# Patient Record
Sex: Male | Born: 1955 | Race: White | Hispanic: No | Marital: Single | Smoking: Former smoker
Health system: Southern US, Community
[De-identification: ages and names within clinical notes are randomized; demographics above are authoritative.]

## PROBLEM LIST (undated history)

## (undated) DIAGNOSIS — F419 Anxiety disorder, unspecified: Secondary | ICD-10-CM

## (undated) DIAGNOSIS — K222 Esophageal obstruction: Secondary | ICD-10-CM

## (undated) DIAGNOSIS — K269 Duodenal ulcer, unspecified as acute or chronic, without hemorrhage or perforation: Secondary | ICD-10-CM

## (undated) DIAGNOSIS — R600 Localized edema: Secondary | ICD-10-CM

## (undated) DIAGNOSIS — K209 Esophagitis, unspecified without bleeding: Secondary | ICD-10-CM

## (undated) DIAGNOSIS — K259 Gastric ulcer, unspecified as acute or chronic, without hemorrhage or perforation: Secondary | ICD-10-CM

## (undated) DIAGNOSIS — F79 Unspecified intellectual disabilities: Secondary | ICD-10-CM

## (undated) DIAGNOSIS — Z872 Personal history of diseases of the skin and subcutaneous tissue: Secondary | ICD-10-CM

## (undated) DIAGNOSIS — E785 Hyperlipidemia, unspecified: Secondary | ICD-10-CM

## (undated) HISTORY — DX: Gastric ulcer, unspecified as acute or chronic, without hemorrhage or perforation: K25.9

## (undated) HISTORY — DX: Esophagitis, unspecified: K20.9

## (undated) HISTORY — DX: Duodenal ulcer, unspecified as acute or chronic, without hemorrhage or perforation: K26.9

## (undated) HISTORY — DX: Esophagitis, unspecified without bleeding: K20.90

## (undated) HISTORY — DX: Esophageal obstruction: K22.2

---

## 1998-04-24 ENCOUNTER — Ambulatory Visit (HOSPITAL_BASED_OUTPATIENT_CLINIC_OR_DEPARTMENT_OTHER): Admission: RE | Admit: 1998-04-24 | Discharge: 1998-04-24 | Payer: Self-pay | Admitting: Dentistry

## 2002-04-08 ENCOUNTER — Emergency Department (HOSPITAL_COMMUNITY): Admission: AD | Admit: 2002-04-08 | Discharge: 2002-04-08 | Payer: Self-pay | Admitting: Emergency Medicine

## 2002-04-08 ENCOUNTER — Encounter: Payer: Self-pay | Admitting: Emergency Medicine

## 2005-07-15 ENCOUNTER — Emergency Department (HOSPITAL_COMMUNITY): Admission: EM | Admit: 2005-07-15 | Discharge: 2005-07-15 | Payer: Self-pay | Admitting: Emergency Medicine

## 2006-06-15 ENCOUNTER — Encounter (INDEPENDENT_AMBULATORY_CARE_PROVIDER_SITE_OTHER): Payer: Self-pay | Admitting: Emergency Medicine

## 2006-06-15 ENCOUNTER — Ambulatory Visit: Payer: Self-pay | Admitting: Vascular Surgery

## 2006-06-15 ENCOUNTER — Emergency Department (HOSPITAL_COMMUNITY): Admission: EM | Admit: 2006-06-15 | Discharge: 2006-06-15 | Payer: Self-pay | Admitting: Emergency Medicine

## 2009-05-02 ENCOUNTER — Ambulatory Visit: Payer: Self-pay | Admitting: Vascular Surgery

## 2010-05-29 NOTE — Procedures (Signed)
DUPLEX DEEP VENOUS EXAM - LOWER EXTREMITY   INDICATION:  Right lower extremity swelling/redness, cellulitis.   HISTORY:  Edema:  Right lower extremity edema since 04/26/2009.  Trauma/Surgery:  No.  Pain:  No.  PE:  No.  Previous DVT:  Unknown.  Anticoagulants:  Other:   DUPLEX EXAM:                CFV   SFV   PopV  PTV    GSV                R  L  R  L  R  L  R   L  R  L  Thrombosis    o  o  o     o     o      o  Spontaneous   +  +  +     +     +      +  Phasic        d  d  d     d     d      d  Augmentation  +  +  +     +     +      +  Compressible  +  +  +     +     +      +  Competent   Legend:  + - yes  o - no  p - partial  D - decreased   IMPRESSION:  No evidence of deep venous thrombosis noted in the right  lower extremity; however, there was limited visualization of the right  calf veins due to extensive lower extremity edema.   A preliminary report was called to Dr. Hayden Rasmussen office on 05/02/2009 and  given to [facility].    _____________________________  Quita Skye. Hart Rochester, M.D.   CH/MEDQ  D:  05/02/2009  T:  05/02/2009  Job:  [id number]

## 2010-11-01 LAB — I-STAT 8, (EC8 V) (CONVERTED LAB)
BUN: 8
Bicarbonate: 28.5 — ABNORMAL HIGH
Chloride: 101
Glucose, Bld: 105 — ABNORMAL HIGH
HCT: 44
Hemoglobin: 15
Operator id: 270111
Potassium: 3.4 — ABNORMAL LOW
Sodium: 136

## 2010-11-01 LAB — CBC
HCT: 42.5
Hemoglobin: 14.5
MCHC: 34.2
MCV: 90.2
Platelets: 286
RBC: 4.72
RDW: 13.6
WBC: 12.5 — ABNORMAL HIGH

## 2010-11-01 LAB — DIFFERENTIAL
Basophils Absolute: 0
Basophils Relative: 0
Eosinophils Absolute: 0
Eosinophils Relative: 0
Lymphocytes Relative: 9 — ABNORMAL LOW
Lymphs Abs: 1.2
Monocytes Absolute: 0.5
Monocytes Relative: 4
Neutro Abs: 10.8 — ABNORMAL HIGH
Neutrophils Relative %: 86 — ABNORMAL HIGH

## 2010-11-01 LAB — POCT I-STAT CREATININE
Creatinine, Ser: 0.8
Operator id: 270111

## 2010-11-01 LAB — D-DIMER, QUANTITATIVE: D-Dimer, Quant: 0.32

## 2012-01-27 ENCOUNTER — Other Ambulatory Visit: Payer: Self-pay | Admitting: Family Medicine

## 2012-01-27 ENCOUNTER — Observation Stay (HOSPITAL_COMMUNITY)
Admission: EM | Admit: 2012-01-27 | Discharge: 2012-01-28 | Payer: Medicare Other | Attending: Orthopedic Surgery | Admitting: Orthopedic Surgery

## 2012-01-27 ENCOUNTER — Encounter (HOSPITAL_COMMUNITY): Payer: Self-pay | Admitting: *Deleted

## 2012-01-27 ENCOUNTER — Observation Stay (HOSPITAL_COMMUNITY): Payer: Medicare Other

## 2012-01-27 ENCOUNTER — Ambulatory Visit
Admission: RE | Admit: 2012-01-27 | Discharge: 2012-01-27 | Disposition: A | Discharge status: Home / Self Care | Payer: Medicaid Other | Source: Ambulatory Visit | Attending: Family Medicine | Admitting: Family Medicine

## 2012-01-27 DIAGNOSIS — S82853A Displaced trimalleolar fracture of unspecified lower leg, initial encounter for closed fracture: Principal | ICD-10-CM | POA: Insufficient documentation

## 2012-01-27 DIAGNOSIS — M25571 Pain in right ankle and joints of right foot: Secondary | ICD-10-CM

## 2012-01-27 DIAGNOSIS — F73 Profound intellectual disabilities: Secondary | ICD-10-CM | POA: Insufficient documentation

## 2012-01-27 DIAGNOSIS — R911 Solitary pulmonary nodule: Secondary | ICD-10-CM | POA: Diagnosis present

## 2012-01-27 DIAGNOSIS — D72829 Elevated white blood cell count, unspecified: Secondary | ICD-10-CM | POA: Insufficient documentation

## 2012-01-27 DIAGNOSIS — R21 Rash and other nonspecific skin eruption: Secondary | ICD-10-CM | POA: Insufficient documentation

## 2012-01-27 DIAGNOSIS — E876 Hypokalemia: Secondary | ICD-10-CM | POA: Insufficient documentation

## 2012-01-27 DIAGNOSIS — M25579 Pain in unspecified ankle and joints of unspecified foot: Secondary | ICD-10-CM | POA: Insufficient documentation

## 2012-01-27 DIAGNOSIS — E785 Hyperlipidemia, unspecified: Secondary | ICD-10-CM | POA: Diagnosis present

## 2012-01-27 HISTORY — DX: Hyperlipidemia, unspecified: E78.5

## 2012-01-27 HISTORY — DX: Unspecified intellectual disabilities: F79

## 2012-01-27 LAB — PROTIME-INR
INR: 1.09 (ref 0.00–1.49)
Prothrombin Time: 14 seconds (ref 11.6–15.2)

## 2012-01-27 LAB — BASIC METABOLIC PANEL
CO2: 24 mEq/L (ref 19–32)
Chloride: 100 mEq/L (ref 96–112)
Glucose, Bld: 125 mg/dL — ABNORMAL HIGH (ref 70–99)
Potassium: 3.4 mEq/L — ABNORMAL LOW (ref 3.5–5.1)
Sodium: 138 mEq/L (ref 135–145)

## 2012-01-27 LAB — CBC WITH DIFFERENTIAL/PLATELET
Eosinophils Absolute: 0 10*3/uL (ref 0.0–0.7)
Hemoglobin: 13.7 g/dL (ref 13.0–17.0)
Lymphs Abs: 0.8 10*3/uL (ref 0.7–4.0)
MCH: 29.7 pg (ref 26.0–34.0)
Monocytes Relative: 6 % (ref 3–12)
Neutro Abs: 9.9 10*3/uL — ABNORMAL HIGH (ref 1.7–7.7)
Neutrophils Relative %: 87 % — ABNORMAL HIGH (ref 43–77)
Platelets: 307 10*3/uL (ref 150–400)
RBC: 4.62 MIL/uL (ref 4.22–5.81)
WBC: 11.3 10*3/uL — ABNORMAL HIGH (ref 4.0–10.5)

## 2012-01-27 LAB — APTT: aPTT: 29 seconds (ref 24–37)

## 2012-01-27 MED ORDER — SODIUM CHLORIDE 0.9 % IV SOLN: 250.0000 mL | INTRAVENOUS | Status: DC | PRN

## 2012-01-27 MED ORDER — SODIUM CHLORIDE 0.9 % IV SOLN
INTRAVENOUS | Status: AC | PRN
  Administered 2012-01-27: 500 mL via INTRAVENOUS

## 2012-01-27 MED ORDER — ASPIRIN EC 81 MG PO TBEC
81.0000 mg | DELAYED_RELEASE_TABLET | Freq: Every day | ORAL | Status: DC
  Administered 2012-01-27 – 2012-01-28 (×2): 81 mg via ORAL
  Filled 2012-01-27 (×2): qty 1

## 2012-01-27 MED ORDER — SODIUM CHLORIDE 0.9 % IJ SOLN: 3.0000 mL | INTRAMUSCULAR | Status: DC | PRN

## 2012-01-27 MED ORDER — SODIUM CHLORIDE 0.9 % IJ SOLN: 3.0000 mL | Freq: Two times a day (BID) | INTRAMUSCULAR | Status: DC

## 2012-01-27 MED ORDER — PROPOFOL 10 MG/ML IV BOLUS
INTRAVENOUS | Status: AC | PRN
  Administered 2012-01-27: 40 mg via INTRAVENOUS

## 2012-01-27 MED ORDER — SODIUM CHLORIDE 0.9 % IV BOLUS (SEPSIS)
500.0000 mL | Freq: Once | INTRAVENOUS | Status: AC
  Administered 2012-01-27: 500 mL via INTRAVENOUS

## 2012-01-27 MED ORDER — HYDROCODONE-ACETAMINOPHEN 5-325 MG PO TABS: 1.0000 | ORAL_TABLET | ORAL | Status: DC | PRN

## 2012-01-27 MED ORDER — PROPOFOL 10 MG/ML IV BOLUS: 0.5000 mg/kg | Freq: Once | INTRAVENOUS | Status: DC

## 2012-01-27 MED ORDER — NYSTATIN 100000 UNIT/GM EX POWD
Freq: Three times a day (TID) | CUTANEOUS | Status: DC
  Administered 2012-01-27 – 2012-01-28 (×2): via TOPICAL
  Filled 2012-01-27 (×3): qty 15

## 2012-01-27 MED ORDER — PROPOFOL 10 MG/ML IV BOLUS
INTRAVENOUS | Status: AC
  Filled 2012-01-27: qty 20

## 2012-01-27 MED ORDER — POTASSIUM CHLORIDE 10 MEQ/100ML IV SOLN
10.0000 meq | INTRAVENOUS | Status: AC
Start: 2012-01-27 — End: 2012-01-28
  Administered 2012-01-27 (×4): 10 meq via INTRAVENOUS
  Filled 2012-01-27 (×4): qty 100

## 2012-01-27 MED ORDER — LIDOCAINE HCL (PF) 1 % IJ SOLN
INTRAMUSCULAR | Status: AC
  Filled 2012-01-27: qty 5

## 2012-01-27 MED ORDER — LIDOCAINE HCL (CARDIAC) 20 MG/ML IV SOLN
INTRAVENOUS | Status: AC
  Filled 2012-01-27: qty 5

## 2012-01-27 MED ORDER — LORAZEPAM 2 MG/ML IJ SOLN: 0.5000 mg | Freq: Three times a day (TID) | INTRAMUSCULAR | Status: DC | PRN

## 2012-01-27 MED ORDER — LACTATED RINGERS IV SOLN: INTRAVENOUS | Status: DC

## 2012-01-27 NOTE — ED Notes (Addendum)
Pt in from [facility] to have x ray to R ankle, per EMS pt is at baseline neuro status, pt accompanied by caregiver, per report the pts ankle is fractured x 3, swelling present to R ankle, ankle is not immobilized at time of arrival to ED, pt resides at [facility], per caregiver the pt slipped when getting out of the facility's van yesterday, pt unable to tolerate wt bearing & cannot ambulate which is a change from baseline

## 2012-01-27 NOTE — ED Provider Notes (Addendum)
History     CSN: [id number]  Arrival date & time 01/27/12  1257   First MD Initiated Contact with Patient 01/27/12 1300      No chief complaint on file.   (Consider location/radiation/quality/duration/timing/severity/associated sxs/prior treatment) Patient is a 57 y.o. male presenting with ankle pain. The history is provided by a caregiver.  Ankle Pain  The incident occurred yesterday. Incident location: Was getting out of the group Van and slipped and fell. The injury mechanism was a fall. The pain is present in the right ankle. The quality of the pain is described as sharp. The pain is at a severity of 10/10. The pain is severe. The pain has been constant since onset. Associated symptoms include inability to bear weight. The symptoms are aggravated by activity, bearing weight and palpation. He has tried nothing for the symptoms. The treatment provided no relief.    No past medical history on file.  No past surgical history on file.  No family history on file.  History  Substance Use Topics  . Smoking status: Not on file  . Smokeless tobacco: Not on file  . Alcohol Use: Not on file      Review of Systems  All other systems reviewed and are negative.    Allergies  Review of patient's allergies indicates not on file.  Home Medications   Current Outpatient Rx  Name  Route  Sig  Dispense  Refill  . ASPIRIN EC 81 MG PO TBEC   Oral   Take 81 mg by mouth daily.         . ATORVASTATIN CALCIUM 10 MG PO TABS   Oral   Take 10 mg by mouth daily.         . CHLORHEXIDINE GLUCONATE 0.12 % MT SOLN   Mouth/Throat   Use as directed 10 mLs in the mouth or throat 3 (three) times daily. Apply to teeth and gums with toothbrush (staff hand over hand)         . VITAMIN D 1000 UNITS PO TABS   Oral   Take 2,000 Units by mouth daily.         . FUROSEMIDE 40 MG PO TABS   Oral   Take 40 mg by mouth daily.         . PSYLLIUM 58.6 % PO POWD   Oral   Take 1 packet by  mouth daily.           SpO2 98%  Physical Exam  Nursing note and vitals reviewed. Constitutional: He is oriented to person, place, and time. He appears well-developed and well-nourished. He appears distressed.  HENT:  Head: Normocephalic and atraumatic.  Mouth/Throat: Oropharynx is clear and moist.  Eyes: Conjunctivae normal and EOM are normal. Pupils are equal, round, and reactive to light.  Neck: Normal range of motion. Neck supple.  Cardiovascular: Normal rate, regular rhythm and intact distal pulses.   No murmur heard. Pulmonary/Chest: Effort normal and breath sounds normal. No respiratory distress. He has no wheezes. He has no rales.  Abdominal: Soft. He exhibits no distension. There is no tenderness. There is no rebound and no guarding.    Musculoskeletal: He exhibits tenderness. He exhibits no edema.       Right ankle: He exhibits decreased range of motion, swelling, ecchymosis and deformity. He exhibits normal pulse. tenderness. Lateral malleolus and medial malleolus tenderness found. No proximal fibula tenderness found.       Arms:      2+ DP  and PT pulses in the right foot  Neurological: He is alert and oriented to person, place, and time.  Skin: Skin is warm and dry. No rash noted. No erythema.  Psychiatric: He has a normal mood and affect. His behavior is normal.    ED Course  Procedures (including critical care time)  Labs Reviewed - No data to display Dg Ankle Complete Right  01/27/2012  *RADIOLOGY REPORT*  Clinical Data: .  Pain and swelling of the ankle.  RIGHT ANKLE - COMPLETE 3+ VIEW  Comparison: None.  Findings: Trimalleolar fracture dislocation of the ankle noted, with the distal tibial articular surface displaced anteriorly by 2 cm with respect to the talus, and with a vertical fracture of the posterior malleolus, a shallow oblique fracture of the lateral malleolus, and a suspected transverse fracture of the medial malleolus. No calcaneal fracture observed.   IMPRESSION:  1.  Trimalleolar fracture dislocation of the ankle.   Original Report Authenticated By: Gaylyn Rong, M.D.     Date: 01/27/2012  Rate: 96  Rhythm: normal sinus rhythm  QRS Axis: normal  Intervals: normal  ST/T Wave abnormalities: normal  Conduction Disutrbances: none  Narrative Interpretation: unremarkable      1. Trimalleolar fracture of ankle, closed       MDM    Patient with a history of a fall yesterday while getting out of the croup a.m. He x-ray today shows a trimalleolar fracture or dislocation of the ankle. He is neurovascularly intact however it however patient refuses to use his leg. Patient is mentally handicapped and is nonverbal however he is awake and alert and does signify that there is pain in his right ankle. Did not attempt to relocate due to the significant swelling and pain. Will discuss with orthopedics for further recommendations  2:28 PM Discussed pt with Dr. [name] who wants to sedate pt in the ED for reduction.  Will have propofol ready.   Procedural sedation Performed by: Gwyneth Sprout Consent: Verbal consent obtained. Risks and benefits: risks, benefits and alternatives were discussed Required items: required blood products, implants, devices, and special equipment available Patient identity confirmed: arm band and provided demographic data Time out: Immediately prior to procedure a "time out" was called to verify the correct patient, procedure, equipment, support staff and site/side marked as required.  Sedation type: moderate (conscious) sedation NPO time confirmed and considedered  Sedatives: PROPOFOL  Physician Time at Bedside: 20 min  Vitals: Vital signs were monitored during sedation. Cardiac Monitor, pulse oximeter Patient tolerance: Patient tolerated the procedure well with no immediate complications. Comments: Pt with uneventful recovered. Returned to pre-procedural sedation baseline     Gwyneth Sprout,  MD 01/27/12 1559  Gwyneth Sprout, MD 01/27/12 1559  Gwyneth Sprout, MD 01/27/12 1610  Gwyneth Sprout, MD 01/27/12 [id number]

## 2012-01-27 NOTE — Progress Notes (Signed)
Orthopedic Tech Progress Note Patient Details:  Steven [name] [birth year] [id number]  Ortho Devices Type of Ortho Device: Post (short leg) splint;Stirrup splint Ortho Device/Splint Location: right leg Ortho Device/Splint Interventions: Application   [name] 01/27/2012, 3:56 PM

## 2012-01-27 NOTE — ED Notes (Signed)
Pt caregiver unable to provide medical history

## 2012-01-27 NOTE — H&P (Addendum)
No primary provider on file. Chief Complaint: Ankle injury   History: Pt in from [facility] to have x ray to R ankle, per EMS pt is at baseline neuro status, pt accompanied by caregiver, per report the pts ankle is fractured x 3, swelling present to R ankle, ankle is not immobilized at time of arrival to ED, pt resides at [facility], per caregiver the pt slipped when getting out of the facility's van yesterday, pt unable to tolerate wt bearing & cannot ambulate which is a change from baseline.  Patient was been unable to ambulate or bear wt since the time of the fall yesterday.    No past medical history on file.  No Known Allergies  No current facility-administered medications on file prior to encounter.   Current Outpatient Prescriptions on File Prior to Encounter  Medication Sig Dispense Refill  . atorvastatin (LIPITOR) 10 MG tablet Take 10 mg by mouth daily.      . furosemide (LASIX) 40 MG tablet Take 40 mg by mouth daily.        Physical Exam: Filed Vitals:   01/27/12 1314  BP: 141/85  Pulse: 96  Temp: 99 F (37.2 C)  Resp: 18   compartments soft/NT 2+ DP/PT pulses Skin intact Swollen ankle No proximal knee pain No sob/cp No pain with hip/knee ROM Image: Dg Ankle Complete Right  01/27/2012  *RADIOLOGY REPORT*  Clinical Data: .  Pain and swelling of the ankle.  RIGHT ANKLE - COMPLETE 3+ VIEW  Comparison: None.  Findings: Trimalleolar fracture dislocation of the ankle noted, with the distal tibial articular surface displaced anteriorly by 2 cm with respect to the talus, and with a vertical fracture of the posterior malleolus, a shallow oblique fracture of the lateral malleolus, and a suspected transverse fracture of the medial malleolus. No calcaneal fracture observed.  IMPRESSION:  1.  Trimalleolar fracture dislocation of the ankle.   Original Report Authenticated By: Gaylyn Rong, M.D.     A/P:  Patient with profound mental retardation who fell  yesterday.  Patient unable to bear weight and had gross deformity. According to aid that is with him he has had worse pain since the fall.  Spoke with his sister and she informed me that he was supposed to be brought to ER yesterday according to what she was told.   Plan:   CR in the ER with sedation  Admit for observation and elevation  Will discuss definitive fracture management  With Dr Victorino Dike  Also given complex situation will consult medicine for non-orthopedic issues   Post-reduction xrays satisfactory Remained intact

## 2012-01-27 NOTE — ED Notes (Signed)
Spoke with Harlow Mares, pts sister re: informed consent for procedure, [place], MD verified consent as well over the phone

## 2012-01-27 NOTE — Consult Note (Signed)
Patient's PCP: [name], MD Consulting physician: Dr. [name]  Reason for the consult: Help manage patient's medical conditions  History of Present Illness: Steven [name] is a 57 y.o. 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  Patient is not able to provide any history most of the history was obtained from talking to the ER physician and patient's sister Steven [name], telephone [phone], who is patient's guardian.  Yesterday patient was getting at the group home [place].  He slipped and fell and sustained injury to the right ankle.  Given persistent pain x-rays were obtained today which showed right common malleolus ankle fracture.  The hospitalist service was consulted by orthopedic service for medical management.  Review of Systems: Not obtainable from the patient due to patient's mentation.  Past Medical History  Diagnosis Date  . Hyperlipidemia   . Mental retardation    History reviewed. No pertinent past surgical history. History reviewed. No pertinent family history. History   Social History  . Marital Status: Single    Spouse Name: N/A    Number of Children: N/A  . Years of Education: N/A   Occupational History  . Not on file.   Social History Main Topics  . Smoking status: Current Every Day Smoker -- 0.5 packs/day    Types: Cigarettes  . Smokeless tobacco: Not on file  . Alcohol Use: No  . Drug Use: No  . Sexually Active:    Other Topics Concern  . Not on file   Social History Narrative  . No narrative on file   Allergies: Allergy to Haldol  Home Meds: Prior to Admission medications   Medication Sig Start Date End Date Taking? Authorizing Provider  aspirin EC 81 MG tablet Take 81 mg by mouth daily.   Yes Historical Provider, MD    atorvastatin (LIPITOR) 10 MG tablet Take 10 mg by mouth daily.   Yes Historical Provider, MD  chlorhexidine (PERIDEX) 0.12 % solution Use as directed 10 mLs in the mouth or throat 3 (three) times daily. Apply to teeth and gums with toothbrush (staff hand over hand)   Yes Historical Provider, MD  cholecalciferol (VITAMIN D) 1000 UNITS tablet Take 2,000 Units by mouth daily.   Yes Historical Provider, MD  furosemide (LASIX) 40 MG tablet Take 40 mg by mouth daily.   Yes Historical Provider, MD  psyllium (METAMUCIL) 58.6 % powder Take 1 packet by mouth daily.   Yes Historical Provider, MD    Physical Exam: Blood pressure 135/73, pulse 94, temperature 99 F (37.2 C), temperature source Oral, resp. rate 24, weight 83.915 kg (185 lb), SpO2 100.00%. General: Awake,  not oriented x3, nonverbal, No acute distress. HEENT: EOMI, Moist mucous membranes Neck: Supple CV: S1 and S2 Lungs: Clear to ascultation bilaterally Abdomen: Soft, Nontender, Nondistended, +bowel sounds. Ext: Good pulses. Trace edema. No clubbing or cyanosis noted.  Right leg in brace. Neuro: Cranial Nerves II-XII grossly intact. Has 5/5 motor strength in upper and lower extremities. Skin: Erythematous Rash on the groin.  Lab results:  [facility] 01/27/12 1634  NA 138  K 3.4*  CL 100  CO2 24  GLUCOSE 125*  BUN 10  CREATININE 0.57  CALCIUM 9.2  MG --  PHOS --   No results found for this basename: AST:2,ALT:2,ALKPHOS:2,BILITOT:2,PROT:2,ALBUMIN:2 in  the last 72 hours No results found for this basename: LIPASE:2,AMYLASE:2 in the last 72 hours  Basename 01/27/12 1417  WBC 11.3*  NEUTROABS 9.9*  HGB 13.7  HCT 41.4  MCV 89.6  PLT 307   No results found for this basename: CKTOTAL:3,CKMB:3,CKMBINDEX:3,TROPONINI:3 in the last 72 hours No components found with this basename: POCBNP:3 No results found for this basename: DDIMER in the last 72 hours No results found for this basename: HGBA1C:2 in the last 72 hours No results  found for this basename: CHOL:2,HDL:2,LDLCALC:2,TRIG:2,CHOLHDL:2,LDLDIRECT:2 in the last 72 hours No results found for this basename: TSH,T4TOTAL,FREET3,T3FREE,THYROIDAB in the last 72 hours No results found for this basename: VITAMINB12:2,FOLATE:2,FERRITIN:2,TIBC:2,IRON:2,RETICCTPCT:2 in the last 72 hours Imaging results:  Dg Ankle Complete Right  01/27/2012  *RADIOLOGY REPORT*  Clinical Data: Post reduction right ankle  RIGHT ANKLE - COMPLETE 3+ VIEW  Comparison: Portable exam 1607 hours compared earlier study of 01/27/2012  Findings: Plaster splint material obscures bony detail. Previously identified tibiotalar dislocation reduced. Widening of medial ankle mortise. Again identified trimalleolar fractures. No new bony abnormalities seen.  IMPRESSION: Trimalleolar fractures right ankle with reduction of previously seen tibiotalar dislocation.   Original Report Authenticated By: Ulyses Southward, M.D.    Dg Ankle Complete Right  01/27/2012  *RADIOLOGY REPORT*  Clinical Data: .  Pain and swelling of the ankle.  RIGHT ANKLE - COMPLETE 3+ VIEW  Comparison: None.  Findings: Trimalleolar fracture dislocation of the ankle noted, with the distal tibial articular surface displaced anteriorly by 2 cm with respect to the talus, and with a vertical fracture of the posterior malleolus, a shallow oblique fracture of the lateral malleolus, and a suspected transverse fracture of the medial malleolus. No calcaneal fracture observed.  IMPRESSION:  1.  Trimalleolar fracture dislocation of the ankle.   Original Report Authenticated By: Gaylyn Rong, M.D.    Other results: EKG: Normal sinus rhythm with HR 96.  Assessment & Plan by Problem: Right bimalleolar fracture Management as per orthopedic service. Per patient's sister does not have any cardiac history, EKG shows normal sinus rhythm with no signs of ischemia. Patient's risk of surgery is average, recommended no further cardiac workup.  Defer to orthopedic service  whether they want to continue aspirin.  Pain control as per orthopedic service.  Hyperlipidemia  Continue statin.  Leukocytosis Likely reactive due to fracture.  Rash on the groin Due to diaper rash.  Topical nystatin 3 times a day for 7 days.  Hypokalemia Replace as needed.  Check magnesium in the morning.  History of mental retardation At baseline.  When necessary Ativan 0.5 mg every 8 hours IV as needed for severe anxiety and agitation.  Prophylaxis As per primary service.  Thank you for the consult.  Will continue to follow. Dr. [name] called and updated.  Time spent on consult, talking to the patient and family, and coordinating care was: 60 mins.  Celsa Nordahl A, MD 01/27/2012, 5:07 PM

## 2012-01-28 DIAGNOSIS — R911 Solitary pulmonary nodule: Secondary | ICD-10-CM | POA: Diagnosis present

## 2012-01-28 LAB — BASIC METABOLIC PANEL
BUN: 8 mg/dL (ref 6–23)
Chloride: 106 mEq/L (ref 96–112)
GFR calc Af Amer: >90 mL/min (ref >90)
Glucose, Bld: 102 mg/dL — ABNORMAL HIGH (ref 70–99)
Potassium: 3.6 mEq/L (ref 3.5–5.1)

## 2012-01-28 MED ORDER — HYDROCODONE-ACETAMINOPHEN 5-325 MG PO TABS
1.0000 | ORAL_TABLET | Freq: Four times a day (QID) | ORAL | Status: DC | PRN
Start: 2012-01-28 — End: 2012-02-07

## 2012-01-28 NOTE — Discharge Summary (Signed)
Patient ID: Steven [name] MRN: [id number] DOB/AGE: [birth year] 57 y.o.  Admit date: Feb 03, 2012 Discharge date: 01/28/2012  Admission Diagnoses:  Principal Problem:  *Trimalleolar fracture Active Problems:  Hyperlipidemia  Rash  Hypokalemia  Leukocytosis   Discharge Diagnoses:  Principal Problem:  *Trimalleolar fracture Active Problems:  Hyperlipidemia  Rash  Hypokalemia  Leukocytosis  status post   Past Medical History  Diagnosis Date  . Hyperlipidemia   . Mental retardation     Surgeries:  on * No surgery found *   Consultants:  [facility] - medically cleared for surgery  Discharged Condition: Improved  Hospital Course: Steven [name] is an 57 y.o. male who was admitted 2012/02/03 for operative treatment of Trimalleolar fracture. Patient failed conservative treatments (please see the history and physical for the specifics) and had severe unremitting pain that affects sleep, daily activities and work/hobbies. After pre-op clearance, the patient was taken to the operating room on * No surgery found * and underwent  .    Patient was given perioperative antibiotics: Anti-infectives    None       Patient was given sequential compression devices and early ambulation to prevent DVT.   Patient benefited maximally from hospital stay and there were no complications. At the time of discharge, the patient was urinating/moving their bowels without difficulty, tolerating a regular diet, pain is controlled with oral pain medications and they have been cleared by PT/OT.   Recent vital signs: Patient Vitals for the past 24 hrs:  BP Temp Temp src Pulse Resp SpO2 Weight  01/28/12 0625 148/63 mmHg 99.3 F (37.4 C) - 98  18  99 % -  02/03/12 2120 135/63 mmHg 99.7 F (37.6 C) Oral 100  18  99 % -  03-Feb-2012 1900 114/65 mmHg 98.2 F (36.8 C) - - 18  98 % -  February 03, 2012 1715 125/83 mmHg - - - 18  - -  Feb 03, 2012 1700 135/73 mmHg - - - 24  - -  February 03, 2012 1645 143/70 mmHg -  - 94  16  100 % -  02/03/2012 1635 - - - 102  20  100 % -  02/03/12 1630 139/65 mmHg - - 98  20  99 % -  Feb 03, 2012 1625 - - - 91  19  100 % -  February 03, 2012 1620 - - - 89  14  100 % -  03-Feb-2012 1615 142/71 mmHg - - 93  19  100 % -  02/03/2012 1610 - - - 100  23  100 % -  Feb 03, 2012 1605 122/86 mmHg - - 90  18  100 % -  2012-02-03 1600 131/85 mmHg - - 88  19  100 % -  2012-02-03 1555 131/81 mmHg - - 88  16  100 % -  03-Feb-2012 1550 108/66 mmHg - - 79  29  94 % -  2012/02/03 1547 108/66 mmHg - - 83  33  86 % -  Feb 03, 2012 1545 115/73 mmHg - - 83  20  100 % -  02-03-2012 1540 - - - - - - 83.915 kg (185 lb)  2012/02/03 1314 141/85 mmHg 99 F (37.2 C) Oral 96  18  97 % -  02/03/2012 1252 - - - - - 98 % -     Recent laboratory studies:  Basename 01/28/12 0525 02/03/12 1634 02/03/2012 1417  WBC -- -- 11.3*  HGB -- -- 13.7  HCT -- -- 41.4  PLT -- -- 307  NA  139 138 --  K 3.6 3.4* --  CL 106 100 --  CO2 23 24 --  BUN 8 10 --  CREATININE 0.52 0.57 --  GLUCOSE 102* 125* --  INR -- -- 1.09  CALCIUM 8.5 -- --     Discharge Medications:     Medication List     As of 01/28/2012  7:03 AM    TAKE these medications         aspirin EC 81 MG tablet   Take 81 mg by mouth daily.      atorvastatin 10 MG tablet   Commonly known as: LIPITOR   Take 10 mg by mouth daily.      chlorhexidine 0.12 % solution   Commonly known as: PERIDEX   Use as directed 10 mLs in the mouth or throat 3 (three) times daily. Apply to teeth and gums with toothbrush (staff hand over hand)      cholecalciferol 1000 UNITS tablet   Commonly known as: VITAMIN D   Take 2,000 Units by mouth daily.      furosemide 40 MG tablet   Commonly known as: LASIX   Take 40 mg by mouth daily.      HYDROcodone-acetaminophen 5-325 MG per tablet   Commonly known as: NORCO/VICODIN   Take 1 tablet by mouth every 6 (six) hours as needed for pain.      psyllium 58.6 % powder   Commonly known as: METAMUCIL   Take 1 packet by mouth daily.         Diagnostic Studies: X-ray Chest Pa And Lateral   01/27/2012  *RADIOLOGY REPORT*  Clinical Data: Weakness, ankle fracture, preop.  CHEST - 2 VIEW  Comparison: 05/07/2007  Findings: Relatively low lung volumes with resultant crowding of bronchovascular structures, infrahilar atelectasis right greater than left.  There is a 2.6 cm focal somewhat nodular   opacity in the left upper lobe. No effusion.  Heart size normal.  Regional bones unremarkable.  IMPRESSION:  1.  Nodular left upper lobe opacity.  Recommend follow-up standard two-view exam once the patient is stable, or CT chest with contrast,  to exclude developing pneumonia or neoplasm.   Original Report Authenticated By: D. Andria Rhein, MD    Dg Ankle Complete Right  01/27/2012  *RADIOLOGY REPORT*  Clinical Data: Post reduction right ankle  RIGHT ANKLE - COMPLETE 3+ VIEW  Comparison: Portable exam 1607 hours compared earlier study of 01/27/2012  Findings: Plaster splint material obscures bony detail. Previously identified tibiotalar dislocation reduced. Widening of medial ankle mortise. Again identified trimalleolar fractures. No new bony abnormalities seen.  IMPRESSION: Trimalleolar fractures right ankle with reduction of previously seen tibiotalar dislocation.   Original Report Authenticated By: Ulyses Southward, M.D.    Dg Ankle Complete Right  01/27/2012  *RADIOLOGY REPORT*  Clinical Data: .  Pain and swelling of the ankle.  RIGHT ANKLE - COMPLETE 3+ VIEW  Comparison: None.  Findings: Trimalleolar fracture dislocation of the ankle noted, with the distal tibial articular surface displaced anteriorly by 2 cm with respect to the talus, and with a vertical fracture of the posterior malleolus, a shallow oblique fracture of the lateral malleolus, and a suspected transverse fracture of the medial malleolus. No calcaneal fracture observed.  IMPRESSION:  1.  Trimalleolar fracture dislocation of the ankle.   Original Report Authenticated By: Gaylyn Rong,  M.D.           Follow-up Information    Follow up with Toni Arthurs, MD. Call today. (  call to make appointment for Wednesday 01/29/12)    Contact information:   [address] [place] [address] [phone]          Discharge Plan:  discharge to residential home  Disposition:  Ok for d/c to back to residential home Must remain strict NWB - may need wheelchair if unable to use crutches Will be seen by Dr Victorino Dike Wednesday to discuss ORIF.  Please arrange to have patients legal guardian present at that office visit    Signed: Venita Lick D for Dr. Venita Lick [facility] [phone] 01/28/2012, 7:03 AM

## 2012-01-28 NOTE — Progress Notes (Signed)
Utilization review completed. Elfego Giammarino, RN, BSN. 

## 2012-01-28 NOTE — Progress Notes (Signed)
    Subjective:     Patient reports pain as 1 on 0-10 scale.   Denies CP or SOB.  Voiding without difficulty. Positive flatus. Objective: Vital signs in last 24 hours: Temp:  [98.2 F (36.8 C)-99.7 F (37.6 C)] 99.3 F (37.4 C) (01/14 0625) Pulse Rate:  [79-102] 98  (01/14 0625) Resp:  [14-33] 18  (01/14 0625) BP: (108-148)/(63-86) 148/63 mmHg (01/14 0625) SpO2:  [86 %-100 %] 99 % (01/14 0625) Weight:  [83.915 kg (185 lb)] 83.915 kg (185 lb) (01/13 1540)  Intake/Output from previous day:   Intake/Output this shift:    Labs:  Basename 01/27/12 1417  HGB 13.7    Basename 01/27/12 1417  WBC 11.3*  RBC 4.62  HCT 41.4  PLT 307    Basename 01/28/12 0525 01/27/12 1634  NA 139 138  K 3.6 3.4*  CL 106 100  CO2 23 24  BUN 8 10  CREATININE 0.52 0.57  GLUCOSE 102* 125*  CALCIUM 8.5 9.2    Basename 01/27/12 1417  LABPT --  INR 1.09    Physical Exam: Neurologically intact ABD soft Intact pulses distally Compartment soft splint intact  Assessment/Plan:    patient appears intact Splint intact Plan on [name]/c to home with f/u tomorrow with Dr Victorino Dike to discuss ORIF  Remain strict NWB on the right LE  Steven [name] for Dr. Venita Lick [facility] [phone] 01/28/2012, 6:58 AM

## 2012-01-28 NOTE — Progress Notes (Signed)
[facility] PROGRESS NOTE  Steven [name] WUJ:[id number] DOB: 1955-05-24 DOA: 01/27/2012 PCP: Steven Inch, MD Consulting physician: Steven [name]  Assessment/Plan: Right bimalleolar fracture  Management as per orthopedic service. Defer to orthopedic service whether they want to continue aspirin. Pain control as per orthopedic service.   Hyperlipidemia  Continue statin.   Leukocytosis  Likely reactive due to fracture.   Rash on the groin  Due to diaper rash. Topical nystatin 3 times a day for 7 days.   Hypokalemia  Resolved with replacement.   History of mental retardation  At baseline. When necessary Ativan 0.5 mg every 8 hours IV as needed for severe anxiety and agitation. Which the patient has not required.  Nodular left upper lobe opacity on chest x-ray Patient without cough or shortness of breath. Will need a follow-up standard two-view exam or CT chest with contrast as outpatient. Steven. Harlow [name] informed of the findings and plan to have an outpatient imaging arranged by the group home.  Prophylaxis  As per primary service.  Family Communication: Patient's sister Steven [name]  Disposition Plan: Per primary service.  HPI/Subjective: Nonverbal.  Objective: Filed Vitals:   01/27/12 1715 01/27/12 1900 01/27/12 2120 01/28/12 0625  BP: 125/83 114/65 135/63 148/63  Pulse:   100 98  Temp:  98.2 F (36.8 C) 99.7 F (37.6 C) 99.3 F (37.4 C)  TempSrc:   Oral   Resp: 18 18 18 18   Weight:      SpO2:  98% 99% 99%    Intake/Output Summary (Last 24 hours) at 01/28/12 0814 Last data filed at 01/28/12 0500  Gross per 24 hour  Intake      0 ml  Output      0 ml  Net      0 ml   Filed Weights   01/27/12 1540  Weight: 83.915 kg (185 lb)    Exam: Physical Exam: General: Awake, Nonverbal, No acute distress. HEENT: EOMI. Neck: Supple CV: S1 and S2 Lungs: Clear to ascultation bilaterally Abdomen: Soft, Nontender, Nondistended, +bowel sounds. Ext:  Good pulses. Trace edema. Right leg in splint.  Data Reviewed: Basic Metabolic Panel:  Lab 01/28/12 9562 01/27/12 1634  NA 139 138  K 3.6 3.4*  CL 106 100  CO2 23 24  GLUCOSE 102* 125*  BUN 8 10  CREATININE 0.52 0.57  CALCIUM 8.5 9.2  MG 2.1 --  PHOS -- --   Liver Function Tests: No results found for this basename: AST:5,ALT:5,ALKPHOS:5,BILITOT:5,PROT:5,ALBUMIN:5 in the last 168 hours No results found for this basename: LIPASE:5,AMYLASE:5 in the last 168 hours No results found for this basename: AMMONIA:5 in the last 168 hours CBC:  Lab 01/27/12 1417  WBC 11.3*  NEUTROABS 9.9*  HGB 13.7  HCT 41.4  MCV 89.6  PLT 307   Cardiac Enzymes: No results found for this basename: CKTOTAL:5,CKMB:5,CKMBINDEX:5,TROPONINI:5 in the last 168 hours BNP (last 3 results) No results found for this basename: PROBNP:3 in the last 8760 hours CBG: No results found for this basename: GLUCAP:5 in the last 168 hours  No results found for this or any previous visit (from the past 240 hour(s)).   Studies: X-ray Chest Pa And Lateral   01/27/2012  *RADIOLOGY REPORT*  Clinical Data: Weakness, ankle fracture, preop.  CHEST - 2 VIEW  Comparison: 05/07/2007  Findings: Relatively low lung volumes with resultant crowding of bronchovascular structures, infrahilar atelectasis right greater than left.  There is a 2.6 cm focal somewhat nodular   opacity in the  left upper lobe. No effusion.  Heart size normal.  Regional bones unremarkable.  IMPRESSION:  1.  Nodular left upper lobe opacity.  Recommend follow-up standard two-view exam once the patient is stable, or CT chest with contrast,  to exclude developing pneumonia or neoplasm.   Original Report Authenticated By: D. Andria Rhein, MD    Dg Ankle Complete Right  01/27/2012  *RADIOLOGY REPORT*  Clinical Data: Post reduction right ankle  RIGHT ANKLE - COMPLETE 3+ VIEW  Comparison: Portable exam 1607 hours compared earlier study of 01/27/2012  Findings: Plaster  splint material obscures bony detail. Previously identified tibiotalar dislocation reduced. Widening of medial ankle mortise. Again identified trimalleolar fractures. No new bony abnormalities seen.  IMPRESSION: Trimalleolar fractures right ankle with reduction of previously seen tibiotalar dislocation.   Original Report Authenticated By: Ulyses Southward, M.D.    Dg Ankle Complete Right  01/27/2012  *RADIOLOGY REPORT*  Clinical Data: .  Pain and swelling of the ankle.  RIGHT ANKLE - COMPLETE 3+ VIEW  Comparison: None.  Findings: Trimalleolar fracture dislocation of the ankle noted, with the distal tibial articular surface displaced anteriorly by 2 cm with respect to the talus, and with a vertical fracture of the posterior malleolus, a shallow oblique fracture of the lateral malleolus, and a suspected transverse fracture of the medial malleolus. No calcaneal fracture observed.  IMPRESSION:  1.  Trimalleolar fracture dislocation of the ankle.   Original Report Authenticated By: Gaylyn Rong, M.D.     Scheduled Meds:   . aspirin EC  81 mg Oral Daily  . nystatin   Topical TID  . propofol  0.5 mg/kg Intravenous Once  . sodium chloride  3 mL Intravenous Q12H   Continuous Infusions:   . lactated ringers      Principal Problem:  *Trimalleolar fracture Active Problems:  Hyperlipidemia  Rash  Hypokalemia  Leukocytosis  Lung nodule seen on imaging study   [facility]  [facility] Pager [phone]. If 8PM-8AM, please contact night-coverage at [email or web address], password [facility] 01/28/2012, 8:14 AM  LOS: 1 day

## 2012-01-28 NOTE — Progress Notes (Signed)
Pt discharged to [facility] group home where the pt resides. Report was called to Maryagnes Amos, RN at facility. Rx given and instructions to follow up with MD [facility] tomorrow were given. Pt left unit in a stable condition via wheelchair.

## 2012-01-31 ENCOUNTER — Encounter (HOSPITAL_COMMUNITY): Payer: Self-pay | Admitting: Pharmacy Technician

## 2012-02-05 ENCOUNTER — Encounter (HOSPITAL_COMMUNITY): Payer: Self-pay | Admitting: *Deleted

## 2012-02-05 ENCOUNTER — Other Ambulatory Visit: Payer: Self-pay | Admitting: Orthopedic Surgery

## 2012-02-05 NOTE — Progress Notes (Signed)
Mr Barmore resides at a group home , [facility].  Pt's sister has POA of patient and will be here on Thursday to sign consent.. I spoke with his sister  Steward Drone and she gave me the phone number for Joe- the owner of the group home, to call with instructions.  Pt has mental retardation as a result of a fever of 108 as he was recovering from the measles when he was a child.

## 2012-02-06 ENCOUNTER — Encounter (HOSPITAL_COMMUNITY): Payer: Self-pay | Admitting: General Practice

## 2012-02-06 ENCOUNTER — Encounter (HOSPITAL_COMMUNITY): Payer: Self-pay | Admitting: Anesthesiology

## 2012-02-06 ENCOUNTER — Ambulatory Visit (HOSPITAL_COMMUNITY): Payer: Medicare Other | Admitting: Anesthesiology

## 2012-02-06 ENCOUNTER — Encounter (HOSPITAL_COMMUNITY)
Admission: RE | Disposition: A | Discharge status: Home / Self Care | Payer: Self-pay | Source: Ambulatory Visit | Attending: Orthopedic Surgery

## 2012-02-06 ENCOUNTER — Observation Stay (HOSPITAL_COMMUNITY)
Admission: RE | Admit: 2012-02-06 | Discharge: 2012-02-07 | Disposition: A | Discharge status: Home / Self Care | Payer: Medicare Other | Source: Ambulatory Visit | Attending: Orthopedic Surgery | Admitting: Orthopedic Surgery

## 2012-02-06 ENCOUNTER — Encounter (HOSPITAL_COMMUNITY): Payer: Self-pay | Admitting: *Deleted

## 2012-02-06 ENCOUNTER — Ambulatory Visit (HOSPITAL_COMMUNITY): Payer: Medicare Other

## 2012-02-06 DIAGNOSIS — E785 Hyperlipidemia, unspecified: Secondary | ICD-10-CM | POA: Insufficient documentation

## 2012-02-06 DIAGNOSIS — J4489 Other specified chronic obstructive pulmonary disease: Secondary | ICD-10-CM | POA: Insufficient documentation

## 2012-02-06 DIAGNOSIS — Z23 Encounter for immunization: Secondary | ICD-10-CM | POA: Insufficient documentation

## 2012-02-06 DIAGNOSIS — S82853A Displaced trimalleolar fracture of unspecified lower leg, initial encounter for closed fracture: Principal | ICD-10-CM | POA: Insufficient documentation

## 2012-02-06 DIAGNOSIS — J449 Chronic obstructive pulmonary disease, unspecified: Secondary | ICD-10-CM | POA: Insufficient documentation

## 2012-02-06 DIAGNOSIS — Z01812 Encounter for preprocedural laboratory examination: Secondary | ICD-10-CM | POA: Insufficient documentation

## 2012-02-06 DIAGNOSIS — R600 Localized edema: Secondary | ICD-10-CM

## 2012-02-06 DIAGNOSIS — S93439A Sprain of tibiofibular ligament of unspecified ankle, initial encounter: Secondary | ICD-10-CM | POA: Insufficient documentation

## 2012-02-06 DIAGNOSIS — F79 Unspecified intellectual disabilities: Secondary | ICD-10-CM | POA: Insufficient documentation

## 2012-02-06 HISTORY — PX: ORIF ANKLE FRACTURE: SHX5408

## 2012-02-06 HISTORY — PX: ORIF ANKLE FRACTURE: SUR919

## 2012-02-06 HISTORY — DX: Personal history of diseases of the skin and subcutaneous tissue: Z87.2

## 2012-02-06 HISTORY — DX: Anxiety disorder, unspecified: F41.9

## 2012-02-06 HISTORY — DX: Localized edema: R60.0

## 2012-02-06 HISTORY — PX: SYNDESMOSIS REPAIR: SHX5182

## 2012-02-06 LAB — SURGICAL PCR SCREEN
MRSA, PCR: NEGATIVE
Staphylococcus aureus: NEGATIVE

## 2012-02-06 LAB — CBC
HCT: 42.7 % (ref 39.0–52.0)
MCHC: 35.1 g/dL (ref 30.0–36.0)
RDW: 13.1 % (ref 11.5–15.5)
WBC: 9.5 10*3/uL (ref 4.0–10.5)

## 2012-02-06 LAB — CREATININE, SERUM
Creatinine, Ser: 0.59 mg/dL (ref 0.50–1.35)
GFR calc Af Amer: >90 mL/min (ref >90)
GFR calc non Af Amer: >90 mL/min (ref >90)

## 2012-02-06 SURGERY — OPEN REDUCTION INTERNAL FIXATION (ORIF) ANKLE FRACTURE
Anesthesia: General | Site: Ankle | Laterality: Right | Wound class: Clean

## 2012-02-06 MED ORDER — OXYCODONE HCL 5 MG PO TABS: 5.0000 mg | ORAL_TABLET | Freq: Once | ORAL | Status: DC | PRN

## 2012-02-06 MED ORDER — SODIUM CHLORIDE 0.9 % IV SOLN: INTRAVENOUS | Status: DC

## 2012-02-06 MED ORDER — VITAMIN D3 25 MCG (1000 UNIT) PO TABS
2000.0000 [IU] | ORAL_TABLET | Freq: Every day | ORAL | Status: DC
  Administered 2012-02-06 – 2012-02-07 (×2): 2000 [IU] via ORAL
  Filled 2012-02-06 (×2): qty 2

## 2012-02-06 MED ORDER — ENOXAPARIN SODIUM 40 MG/0.4ML ~~LOC~~ SOLN
40.0000 mg | SUBCUTANEOUS | Status: DC
  Administered 2012-02-07: 40 mg via SUBCUTANEOUS
  Filled 2012-02-06 (×2): qty 0.4

## 2012-02-06 MED ORDER — NEOSTIGMINE METHYLSULFATE 1 MG/ML IJ SOLN
INTRAMUSCULAR | Status: DC | PRN
  Administered 2012-02-06: 4 mg via INTRAVENOUS

## 2012-02-06 MED ORDER — SODIUM CHLORIDE 0.9 % IV SOLN
INTRAVENOUS | Status: DC
  Administered 2012-02-07: 04:00:00 via INTRAVENOUS

## 2012-02-06 MED ORDER — MUPIROCIN 2 % EX OINT
TOPICAL_OINTMENT | CUTANEOUS | Status: AC
  Filled 2012-02-06: qty 22

## 2012-02-06 MED ORDER — ACETAMINOPHEN 10 MG/ML IV SOLN
INTRAVENOUS | Status: AC
  Administered 2012-02-06: 1000 mg via INTRAVENOUS
  Filled 2012-02-06: qty 100

## 2012-02-06 MED ORDER — BUPIVACAINE HCL (PF) 0.25 % IJ SOLN
INTRAMUSCULAR | Status: AC
  Filled 2012-02-06: qty 30

## 2012-02-06 MED ORDER — CHLORHEXIDINE GLUCONATE 0.12 % MT SOLN
10.0000 mL | Freq: Three times a day (TID) | OROMUCOSAL | Status: DC
  Administered 2012-02-06 – 2012-02-07 (×3): 10 mL via OROMUCOSAL
  Filled 2012-02-06 (×5): qty 15

## 2012-02-06 MED ORDER — ONDANSETRON HCL 4 MG/2ML IJ SOLN: 4.0000 mg | Freq: Four times a day (QID) | INTRAMUSCULAR | Status: DC | PRN

## 2012-02-06 MED ORDER — LIDOCAINE HCL (CARDIAC) 20 MG/ML IV SOLN
INTRAVENOUS | Status: DC | PRN
  Administered 2012-02-06: 50 mg via INTRAVENOUS

## 2012-02-06 MED ORDER — SENNA 8.6 MG PO TABS
1.0000 | ORAL_TABLET | Freq: Two times a day (BID) | ORAL | Status: DC
  Administered 2012-02-06 – 2012-02-07 (×3): 8.6 mg via ORAL
  Filled 2012-02-06 (×4): qty 1

## 2012-02-06 MED ORDER — PROPOFOL 10 MG/ML IV BOLUS
INTRAVENOUS | Status: DC | PRN
  Administered 2012-02-06: 140 mg via INTRAVENOUS

## 2012-02-06 MED ORDER — CEFAZOLIN SODIUM-DEXTROSE 2-3 GM-% IV SOLR
2.0000 g | INTRAVENOUS | Status: AC
  Administered 2012-02-06: 2 g via INTRAVENOUS

## 2012-02-06 MED ORDER — FENTANYL CITRATE 0.05 MG/ML IJ SOLN: 50.0000 ug | INTRAMUSCULAR | Status: DC | PRN

## 2012-02-06 MED ORDER — ONDANSETRON HCL 4 MG PO TABS: 4.0000 mg | ORAL_TABLET | Freq: Four times a day (QID) | ORAL | Status: DC | PRN

## 2012-02-06 MED ORDER — FENTANYL CITRATE 0.05 MG/ML IJ SOLN
INTRAMUSCULAR | Status: DC | PRN
  Administered 2012-02-06: 50 ug via INTRAVENOUS
  Administered 2012-02-06: 100 ug via INTRAVENOUS
  Administered 2012-02-06: 50 ug via INTRAVENOUS

## 2012-02-06 MED ORDER — BACITRACIN ZINC 500 UNIT/GM EX OINT
TOPICAL_OINTMENT | CUTANEOUS | Status: AC
  Filled 2012-02-06: qty 15

## 2012-02-06 MED ORDER — OXYCODONE HCL 5 MG PO TABS: 5.0000 mg | ORAL_TABLET | ORAL | Status: DC | PRN

## 2012-02-06 MED ORDER — GLYCOPYRROLATE 0.2 MG/ML IJ SOLN
INTRAMUSCULAR | Status: DC | PRN
  Administered 2012-02-06: .5 mg via INTRAVENOUS

## 2012-02-06 MED ORDER — HYDROMORPHONE HCL PF 1 MG/ML IJ SOLN: 0.2500 mg | INTRAMUSCULAR | Status: DC | PRN

## 2012-02-06 MED ORDER — ASPIRIN EC 325 MG PO TBEC
325.0000 mg | DELAYED_RELEASE_TABLET | Freq: Every day | ORAL | Status: DC
  Administered 2012-02-06 – 2012-02-07 (×2): 325 mg via ORAL
  Filled 2012-02-06 (×2): qty 1

## 2012-02-06 MED ORDER — ATORVASTATIN CALCIUM 10 MG PO TABS
10.0000 mg | ORAL_TABLET | Freq: Every day | ORAL | Status: DC
  Administered 2012-02-06 – 2012-02-07 (×2): 10 mg via ORAL
  Filled 2012-02-06 (×2): qty 1

## 2012-02-06 MED ORDER — INFLUENZA VIRUS VACC SPLIT PF IM SUSP
0.5000 mL | INTRAMUSCULAR | Status: AC
  Administered 2012-02-07: 0.5 mL via INTRAMUSCULAR
  Filled 2012-02-06: qty 0.5

## 2012-02-06 MED ORDER — MORPHINE SULFATE 2 MG/ML IJ SOLN
1.0000 mg | INTRAMUSCULAR | Status: DC | PRN
  Administered 2012-02-06: 1 mg via INTRAVENOUS
  Filled 2012-02-06: qty 1

## 2012-02-06 MED ORDER — FUROSEMIDE 40 MG PO TABS
40.0000 mg | ORAL_TABLET | Freq: Every day | ORAL | Status: DC
  Administered 2012-02-07: 40 mg via ORAL
  Filled 2012-02-06 (×2): qty 1

## 2012-02-06 MED ORDER — LACTATED RINGERS IV SOLN
INTRAVENOUS | Status: DC | PRN
  Administered 2012-02-06 (×2): via INTRAVENOUS

## 2012-02-06 MED ORDER — ONDANSETRON HCL 4 MG/2ML IJ SOLN
INTRAMUSCULAR | Status: DC | PRN
  Administered 2012-02-06: 4 mg via INTRAVENOUS

## 2012-02-06 MED ORDER — BUPIVACAINE LIPOSOME 1.3 % IJ SUSP
20.0000 mL | Freq: Once | INTRAMUSCULAR | Status: DC
  Filled 2012-02-06: qty 20

## 2012-02-06 MED ORDER — BUPIVACAINE LIPOSOME 1.3 % IJ SUSP
INTRAMUSCULAR | Status: DC | PRN
  Administered 2012-02-06: 20 mL

## 2012-02-06 MED ORDER — 0.9 % SODIUM CHLORIDE (POUR BTL) OPTIME
TOPICAL | Status: DC | PRN
  Administered 2012-02-06: 1000 mL

## 2012-02-06 MED ORDER — CHLORHEXIDINE GLUCONATE 4 % EX LIQD: 60.0000 mL | Freq: Once | CUTANEOUS | Status: DC

## 2012-02-06 MED ORDER — OXYCODONE HCL 5 MG/5ML PO SOLN: 5.0000 mg | Freq: Once | ORAL | Status: DC | PRN

## 2012-02-06 MED ORDER — MUPIROCIN 2 % EX OINT
TOPICAL_OINTMENT | Freq: Two times a day (BID) | CUTANEOUS | Status: DC
  Administered 2012-02-06 – 2012-02-07 (×2): via NASAL
  Filled 2012-02-06: qty 22

## 2012-02-06 MED ORDER — PSYLLIUM 95 % PO PACK
1.0000 | PACK | Freq: Two times a day (BID) | ORAL | Status: DC
  Administered 2012-02-06 – 2012-02-07 (×2): 1 via ORAL
  Filled 2012-02-06 (×3): qty 1

## 2012-02-06 MED ORDER — MIDAZOLAM HCL 2 MG/2ML IJ SOLN: 0.5000 mg | INTRAMUSCULAR | Status: DC | PRN

## 2012-02-06 MED ORDER — DOCUSATE SODIUM 100 MG PO CAPS
100.0000 mg | ORAL_CAPSULE | Freq: Two times a day (BID) | ORAL | Status: DC
  Administered 2012-02-07: 100 mg via ORAL
  Filled 2012-02-06 (×2): qty 1

## 2012-02-06 MED ORDER — CEFAZOLIN SODIUM-DEXTROSE 2-3 GM-% IV SOLR
INTRAVENOUS | Status: AC
  Filled 2012-02-06: qty 50

## 2012-02-06 MED ORDER — ROCURONIUM BROMIDE 100 MG/10ML IV SOLN
INTRAVENOUS | Status: DC | PRN
  Administered 2012-02-06: 50 mg via INTRAVENOUS

## 2012-02-06 MED ORDER — CEFAZOLIN SODIUM-DEXTROSE 2-3 GM-% IV SOLR
2.0000 g | Freq: Four times a day (QID) | INTRAVENOUS | Status: AC
  Administered 2012-02-06 – 2012-02-07 (×3): 2 g via INTRAVENOUS
  Filled 2012-02-06 (×3): qty 50

## 2012-02-06 MED ORDER — PNEUMOCOCCAL VAC POLYVALENT 25 MCG/0.5ML IJ INJ
0.5000 mL | INJECTION | INTRAMUSCULAR | Status: AC
  Administered 2012-02-07: 0.5 mL via INTRAMUSCULAR
  Filled 2012-02-06: qty 0.5

## 2012-02-06 MED ORDER — LACTATED RINGERS IV SOLN
INTRAVENOUS | Status: DC
  Administered 2012-02-06: 12:00:00 via INTRAVENOUS

## 2012-02-06 SURGICAL SUPPLY — 66 items
BANDAGE ESMARK 6X9 LF (GAUZE/BANDAGES/DRESSINGS) ×1 IMPLANT
BIT DRILL 2.5X2.75 QC CALB (BIT) ×1 IMPLANT
BIT DRILL CALIBRATED 2.7 (BIT) ×1 IMPLANT
BLADE SURG 15 STRL LF DISP TIS (BLADE) ×1 IMPLANT
BLADE SURG 15 STRL SS (BLADE) ×2
BNDG CMPR 9X6 STRL LF SNTH (GAUZE/BANDAGES/DRESSINGS) ×1
BNDG COHESIVE 4X5 TAN STRL (GAUZE/BANDAGES/DRESSINGS) ×2 IMPLANT
BNDG COHESIVE 6X5 TAN STRL LF (GAUZE/BANDAGES/DRESSINGS) ×2 IMPLANT
BNDG ESMARK 6X9 LF (GAUZE/BANDAGES/DRESSINGS) ×2
CHLORAPREP W/TINT 26ML (MISCELLANEOUS) ×2 IMPLANT
CLOTH BEACON ORANGE TIMEOUT ST (SAFETY) ×2 IMPLANT
COVER SURGICAL LIGHT HANDLE (MISCELLANEOUS) ×2 IMPLANT
CUFF TOURNIQUET SINGLE 34IN LL (TOURNIQUET CUFF) ×2 IMPLANT
CUFF TOURNIQUET SINGLE 44IN (TOURNIQUET CUFF) IMPLANT
DRAPE C-ARM 42X72 X-RAY (DRAPES) ×2 IMPLANT
DRAPE C-ARMOR (DRAPES) ×2 IMPLANT
DRAPE U-SHAPE 47X51 STRL (DRAPES) ×2 IMPLANT
DRSG ADAPTIC 3X8 NADH LF (GAUZE/BANDAGES/DRESSINGS) ×1 IMPLANT
DRSG PAD ABDOMINAL 8X10 ST (GAUZE/BANDAGES/DRESSINGS) ×3 IMPLANT
ELECT REM PT RETURN 9FT ADLT (ELECTROSURGICAL) ×2
ELECTRODE REM PT RTRN 9FT ADLT (ELECTROSURGICAL) ×1 IMPLANT
GLOVE BIO SURGEON STRL SZ8 (GLOVE) ×2 IMPLANT
GLOVE BIOGEL PI IND STRL 8 (GLOVE) ×1 IMPLANT
GLOVE BIOGEL PI INDICATOR 8 (GLOVE) ×1
GOWN PREVENTION PLUS XLARGE (GOWN DISPOSABLE) ×2 IMPLANT
GOWN STRL NON-REIN LRG LVL3 (GOWN DISPOSABLE) ×4 IMPLANT
K-WIRE ACE 1.6X6 (WIRE) ×2
KIT BASIN OR (CUSTOM PROCEDURE TRAY) ×2 IMPLANT
KIT ROOM TURNOVER OR (KITS) ×2 IMPLANT
KWIRE ACE 1.6X6 (WIRE) IMPLANT
MANIFOLD NEPTUNE II (INSTRUMENTS) ×2 IMPLANT
NEEDLE 22X1 1/2 (OR ONLY) (NEEDLE) ×1 IMPLANT
NS IRRIG 1000ML POUR BTL (IV SOLUTION) ×2 IMPLANT
PACK ORTHO EXTREMITY (CUSTOM PROCEDURE TRAY) ×2 IMPLANT
PAD ARMBOARD 7.5X6 YLW CONV (MISCELLANEOUS) ×4 IMPLANT
PAD CAST 4YDX4 CTTN HI CHSV (CAST SUPPLIES) ×1 IMPLANT
PADDING CAST COTTON 4X4 STRL (CAST SUPPLIES) ×2
PLATE LOCK 8H 103 BILAT FIB (Plate) ×1 IMPLANT
SCREW ACE CAN 4.0 42M (Screw) ×1 IMPLANT
SCREW ACE CAN 4.0 44M (Screw) ×1 IMPLANT
SCREW CORT T15 TPR 55X3.5XST (Screw) IMPLANT
SCREW CORTICAL 3.5X55MM (Screw) ×2 IMPLANT
SCREW LOCK CANC STAR 4X10 (Screw) ×1 IMPLANT
SCREW LOCK CANC STAR 4X12 (Screw) ×1 IMPLANT
SCREW LOCK CANC STAR 4X14 (Screw) ×1 IMPLANT
SCREW LOCK CORT STAR 3.5X12 (Screw) ×2 IMPLANT
SCREW LP 3.5 (Screw) ×1 IMPLANT
SCREW NON LOCKING LP 3.5 16MM (Screw) ×1 IMPLANT
SPLINT PLASTER CAST XFAST 5X30 (CAST SUPPLIES) IMPLANT
SPLINT PLASTER XFAST SET 5X30 (CAST SUPPLIES) ×1
SPONGE GAUZE 4X4 12PLY (GAUZE/BANDAGES/DRESSINGS) ×1 IMPLANT
SPONGE LAP 18X18 X RAY DECT (DISPOSABLE) ×2 IMPLANT
STAPLER VISISTAT 35W (STAPLE) IMPLANT
SUCTION FRAZIER TIP 10 FR DISP (SUCTIONS) ×2 IMPLANT
SUT ETHILON 3 0 PS 1 (SUTURE) ×2 IMPLANT
SUT MNCRL AB 3-0 PS2 18 (SUTURE) IMPLANT
SUT PROLENE 3 0 PS 2 (SUTURE) ×2 IMPLANT
SUT VIC AB 2-0 CT1 27 (SUTURE) ×4
SUT VIC AB 2-0 CT1 TAPERPNT 27 (SUTURE) ×2 IMPLANT
SUT VIC AB 3-0 PS2 18 (SUTURE) ×2
SUT VIC AB 3-0 PS2 18XBRD (SUTURE) ×1 IMPLANT
SYR CONTROL 10ML LL (SYRINGE) ×1 IMPLANT
TOWEL OR 17X24 6PK STRL BLUE (TOWEL DISPOSABLE) ×2 IMPLANT
TOWEL OR 17X26 10 PK STRL BLUE (TOWEL DISPOSABLE) ×2 IMPLANT
TUBE CONNECTING 12X1/4 (SUCTIONS) ×2 IMPLANT
WATER STERILE IRR 1000ML POUR (IV SOLUTION) ×1 IMPLANT

## 2012-02-06 NOTE — H&P (Signed)
Steven [name] is an 57 y.o. male.   Chief Complaint: right ankle fracture HPI: 57 y/o male with PMH of mental disability inujured right ankle about a week ago.  He was reduced in the ER and presents now for ORIF of his unstable displaced right ankle fracture.  His sister is POA and is here with him today.  Past Medical History  Diagnosis Date  . Hyperlipidemia   . Anxiety   . Mental retardation     ue to temp of 108 aftrer having measles age 54.  . Hx of diaper rash     History reviewed. No pertinent past surgical history.  History reviewed. No pertinent family history. Social History:  reports that he has been smoking Cigarettes.  He has a 7.5 pack-year smoking history. He does not have any smokeless tobacco history on file. He reports that he does not drink alcohol or use illicit drugs.  Allergies:  Allergies  Allergen Reactions  . Haldol (Haloperidol Lactate) Palpitations    Sleeps for days    Medications Prior to Admission  Medication Sig Dispense Refill  . aspirin EC 81 MG tablet Take 81 mg by mouth daily.      Marland Kitchen atorvastatin (LIPITOR) 10 MG tablet Take 10 mg by mouth daily.      . chlorhexidine (PERIDEX) 0.12 % solution Use as directed 10 mLs in the mouth or throat 3 (three) times daily. Apply to teeth and gums with toothbrush (staff hand over hand)      . cholecalciferol (VITAMIN D) 1000 UNITS tablet Take 2,000 Units by mouth daily.      . furosemide (LASIX) 40 MG tablet Take 40 mg by mouth daily.      Marland Kitchen HYDROcodone-acetaminophen (NORCO/VICODIN) 5-325 MG per tablet Take 1 tablet by mouth every 6 (six) hours as needed for pain.  30 tablet  0  . psyllium (METAMUCIL) 58.6 % powder Take 1 packet by mouth daily.        No results found for this or any previous visit (from the past 48 hour(s)). No results found.  ROS  No recent f/c/n/v/wt loss.  Blood pressure 145/84, pulse 85, temperature 98.2 F (36.8 C), temperature source Oral, resp. rate 18, SpO2 98.00%. Physical  Exam  wn wd male in nad.  Alert.  EOMI.  Respirations unlabored.  Right ankle splinted.  Wiggles toes.  Brisk cap refill at toes.  Assessment/Plan Right ankle trimal fracture - to OR for ORIF.  The risks and benefits of the alternative treatment options have been discussed in detail.  The patient's family wishes to proceed with surgery and specifically understands risks of bleeding, infection, nerve damage, blood clots, need for additional surgery, amputation and death.   Toni Arthurs 02/11/2012, 12:32 PM

## 2012-02-06 NOTE — Anesthesia Postprocedure Evaluation (Signed)
  Anesthesia Post-op Note  Patient: Steven [name]  Procedure(s) Performed: Procedure(s) (LRB) with comments: OPEN REDUCTION INTERNAL FIXATION (ORIF) ANKLE FRACTURE (Right) SYNDESMOSIS REPAIR (Right) - tri maleolar  Patient Location: PACU  Anesthesia Type:General  Level of Consciousness: awake and alert   Airway and Oxygen Therapy: Patient Spontanous Breathing  Post-op Pain: none  Post-op Assessment: Post-op Vital signs reviewed, Patient's Cardiovascular Status Stable, Respiratory Function Stable, Patent Airway and No signs of Nausea or vomiting  Post-op Vital Signs: Reviewed and stable  Complications: No apparent anesthesia complications

## 2012-02-06 NOTE — Brief Op Note (Signed)
02/06/2012  8:45 PM  PATIENT:  Steven [name]  57 y.o. male  PRE-OPERATIVE DIAGNOSIS:  right ankle trimalleolar fracture   POST-OPERATIVE DIAGNOSIS:  right ankle trimalleolar fracture and syndesmosis disruption  Procedure(s): 1.  ORIF right ankle trimalleolar fracture 2.  ORIF right ankle syndesmosis disruption 3.  Fluoro 4.  Stress exam of right ankle under fluoro  SURGEON:  Toni Arthurs, MD  ASSISTANT: n/a  ANESTHESIA:   General  EBL:  minimal   TOURNIQUET:  55 min at 250 mm Hg  COMPLICATIONS:  None apparent  DISPOSITION:  Extubated, awake and stable to recovery.  DICTATION ID:  [id number]

## 2012-02-06 NOTE — Preoperative (Signed)
Beta Blockers   Reason not to administer Beta Blockers:Not Applicable 

## 2012-02-06 NOTE — Anesthesia Procedure Notes (Signed)
Procedure Name: Intubation Date/Time: 02/06/2012 12:50 PM Performed by: Lovie Chol Pre-anesthesia Checklist: Patient identified, Emergency Drugs available, Suction available, Patient being monitored and Timeout performed Patient Re-evaluated:Patient Re-evaluated prior to inductionOxygen Delivery Method: Circle system utilized Preoxygenation: Pre-oxygenation with 100% oxygen Intubation Type: IV induction Ventilation: Mask ventilation without difficulty Laryngoscope Size: Miller and 2 Grade View: Grade I Tube type: Oral Tube size: 7.5 mm Number of attempts: 1 Airway Equipment and Method: Stylet Placement Confirmation: ETT inserted through vocal cords under direct vision,  positive ETCO2,  CO2 detector and breath sounds checked- equal and bilateral Secured at: 22 cm Tube secured with: Tape Dental Injury: Teeth and Oropharynx as per pre-operative assessment

## 2012-02-06 NOTE — Anesthesia Preprocedure Evaluation (Addendum)
Anesthesia Evaluation  Patient identified by MRN, date of birth, ID band Patient awake  General Assessment Comment:Mental Retardation  Reviewed: Allergy & Precautions, H&P , NPO status , Patient's Chart, lab work & pertinent test results  History of Anesthesia Complications Negative for: history of anesthetic complications  Airway  TM Distance: >3 FB Neck ROM: Full    Dental No notable dental hx. 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    GI/Hepatic negative GI ROS, Neg liver ROS,   Endo/Other  negative endocrine ROS  Renal/GU negative Renal ROS  negative genitourinary   Musculoskeletal   Abdominal   Peds  Hematology negative hematology ROS (+)   Anesthesia Other Findings Unable to perform airway assessment while patient is awake.  Reproductive/Obstetrics negative OB ROS                        Anesthesia Physical Anesthesia Plan  ASA: III  Anesthesia Plan: General   Post-op Pain Management:    Induction: Intravenous  Airway Management Planned: LMA  Additional Equipment:   Intra-op Plan:   Post-operative Plan: Extubation in OR  Informed Consent: I have reviewed the patients History and Physical, chart, labs and discussed the procedure including the risks, benefits and alternatives for the proposed anesthesia with the patient or authorized representative who has indicated his/her understanding and acceptance.   Dental advisory given  Plan Discussed with: CRNA  Anesthesia Plan Comments:         Anesthesia Quick Evaluation

## 2012-02-07 ENCOUNTER — Encounter (HOSPITAL_COMMUNITY): Payer: Self-pay | Admitting: Orthopedic Surgery

## 2012-02-07 MED ORDER — DSS 100 MG PO CAPS: 100.0000 mg | ORAL_CAPSULE | Freq: Two times a day (BID) | ORAL | Status: DC

## 2012-02-07 MED ORDER — OXYCODONE HCL 5 MG PO TABS: 5.0000 mg | ORAL_TABLET | ORAL | Status: DC | PRN

## 2012-02-07 MED ORDER — SENNA 8.6 MG PO TABS: 2.0000 | ORAL_TABLET | Freq: Two times a day (BID) | ORAL | Status: DC

## 2012-02-07 MED ORDER — ASPIRIN 325 MG PO TBEC: 325.0000 mg | DELAYED_RELEASE_TABLET | Freq: Every day | ORAL | Status: DC

## 2012-02-07 NOTE — Discharge Summary (Signed)
Physician Discharge Summary  Patient ID: DAILY CRATE MRN: [id number] DOB/AGE: 21-Jul-1955 57 y.o.  Admit date: 02/06/2012 Discharge date: 02/07/2012  Admission Diagnoses:  Mental retardation, COPD, h/o cigarette smoking, right ankle trimalleolar fracture / dislocation  Discharge Diagnoses:  Same, s/p ORIF right ankle fracture  Discharged Condition: stable  Hospital Course: Pt was admitted and taken to the oR on 02/06/12.  He underwent ORIF of his trimal ankle fracture and syndesmosis disruption.  He tolerated the procedure well and was transferred to 5N where he remained until discharge.  His post operative course was unremarkable.  He is discharged back to the group home in stable condition.  He is non weight bearing on the right foot for the next 6 weeks.  Consults: None  Significant Diagnostic Studies: none  Treatments: surgery: as above  Discharge Exam: Blood pressure 131/75, pulse 79, temperature 98 F (36.7 C), temperature source Oral, resp. rate 18, height 5\' 5"  (1.651 m), weight 77.6 kg (171 lb 1.2 oz), SpO2 98.00%. Pt is alert this morning with no apparant distress.  R LE is splinted.  Toes have brisk cap refill.  Disposition: group home  Discharge Orders    Future Orders Please Complete By Expires   Diet - low sodium heart healthy      Call MD / Call 911      Comments:   If you experience chest pain or shortness of breath, CALL 911 and be transported to the hospital emergency room.  If you develope a fever above 101 F, pus (white drainage) or increased drainage or redness at the wound, or calf pain, call your surgeon's office.   Constipation Prevention      Comments:   Drink plenty of fluids.  Prune juice may be helpful.  You may use a stool softener, such as Colace (over the counter) 100 mg twice a day.  Use MiraLax (over the counter) for constipation as needed.   Increase activity slowly as tolerated      Non weight bearing      Comments:   No weight on right  foot.       Medication List     As of 02/07/2012  7:45 AM    STOP taking these medications         HYDROcodone-acetaminophen 5-325 MG per tablet   Commonly known as: NORCO/VICODIN      TAKE these medications         aspirin 325 MG EC tablet   Take 1 tablet (325 mg total) by mouth daily.      atorvastatin 10 MG tablet   Commonly known as: LIPITOR   Take 10 mg by mouth daily.      chlorhexidine 0.12 % solution   Commonly known as: PERIDEX   Use as directed 10 mLs in the mouth or throat 3 (three) times daily. Apply to teeth and gums with toothbrush (staff hand over hand)      cholecalciferol 1000 UNITS tablet   Commonly known as: VITAMIN D   Take 2,000 Units by mouth daily.      DSS 100 MG Caps   Take 100 mg by mouth 2 (two) times daily.      furosemide 40 MG tablet   Commonly known as: LASIX   Take 40 mg by mouth daily.      oxyCODONE 5 MG immediate release tablet   Commonly known as: Oxy IR/ROXICODONE   Take 1 tablet (5 mg total) by mouth every 4 (four) hours  as needed for pain.      psyllium 58.6 % powder   Commonly known as: METAMUCIL   Take 1 packet by mouth daily.      senna 8.6 MG Tabs   Commonly known as: SENOKOT   Take 2 tablets (17.2 mg total) by mouth 2 (two) times daily.           Follow-up Information    Follow up with Makel Mcmann, Jonny Ruiz, MD. Schedule an appointment as soon as possible for a visit in 2 weeks.   Contact information:   [address] [place] [address] [phone]          Signed: Toni Arthurs 02/07/2012, 7:45 AM

## 2012-02-07 NOTE — Progress Notes (Signed)
Utilization review completed. Delfin Squillace, RN, BSN. 

## 2012-02-07 NOTE — Op Note (Signed)
Steven [name], Steven [name] NO.:  [id number]  MEDICAL RECORD NO.:  [id number]  LOCATION:  5N12C                        FACILITY:  [facility]  PHYSICIAN:  Toni Arthurs, MD        DATE OF BIRTH:  01-Jan-1956  DATE OF PROCEDURE: DATE OF DISCHARGE:                              OPERATIVE REPORT   PREOPERATIVE DIAGNOSIS:  Right ankle trimalleolar fracture.  POSTOPERATIVE DIAGNOSES: 1. Right ankle trimalleolar fracture. 2. Right ankle syndesmosis disruption.  PROCEDURES: 1. Open reduction and internal fixation of right ankle trimalleolar     fracture with fixation of the posterior malleolus fracture. 2. Open reduction and internal fixation of right ankle syndesmosis     disruption. 3. Intraoperative interpretation of fluoroscopic imaging. 4. Stress examination of right ankle under fluoroscopy.  SURGEON:  Toni Arthurs, MD  ANESTHESIA:  General.  ESTIMATED BLOOD LOSS:  Minimal.  TOURNIQUET TIME:  55 minutes at 250 mmHg.  COMPLICATIONS:  None apparent.  DISPOSITION:  Extubated, awake and stable to recovery.  INDICATIONS FOR PROCEDURE:  The patient is a 57 year old mentally retarded male who sustained a right ankle fracture approximately 10 days ago.  He underwent closed reduction and splinting in the emergency department.  He presents now for operative treatment of this unstable displaced right ankle trimalleolar fracture.  His sister who is power of attorney understands the risks and benefits of the alternative treatment options and elects surgical treatment.  She has specifically understands risks of bleeding, infection, nerve damage, blood clots, need for additional surgery, amputation, and death.  PROCEDURE IN DETAIL:  After preoperative consent was obtained and the correct operative site was identified, the patient was brought to the operating room and placed supine on the operating room table.  General anesthesia was induced.  Preoperative antibiotics were  administered. Surgical time-out was taken.  The right lower extremity was prepped and draped in standard sterile fashion and tourniquet around the thigh.  The extremity was exsanguinated and tourniquet was inflated to 250 mmHg.  A longitudinal incision was made over the lateral malleolus.  Sharp dissection was carried down through the skin and subcutaneous tissue. The fracture was noted to be significant and periosteum of the fracture site was not disturbed.  The interval between the posterior aspect of the fibula and the peroneal tendons was developed.  Blunt dissection was carried down posterior to the tibia to the posterior malleolus fracture fragment.  A Joker elevator was inserted into the fracture site, mobilizing the fracture appropriately.  The fracture was then reduced. A stab incision was made in the anterior aspect of the ankle and a Weber tenaculum was placed to that posterior incision clamping the fracture in a reduced position.  A separate stab incision was made anteriorly and the guidepin was inserted from anterior to posterior of the distal tibia.  A 4-mm partially-threaded cannulated screw was then inserted over the guidepin and into the fracture site.  The screw was noted to have excellent purchase and reduced the fracture appropriately. Guidepin was removed.  Fluoroscopic imaging confirmed appropriate position of the guidepin prior to placement of the screw and appropriate position of the screw.  Attention was then turned to the lateral  malleolus.  The lateral malleolus fracture was reduced.  A combination locking an LCDC plate was selected from the [employer] set.  It was contoured to fit the lateral malleolus with AP fluoroscopic image confirmed appropriate position of the plate.  So, it was secured distally with a single nonlocking screws. Two more locking screws were then placed holding the plate down to the distal fibula.  The nonlocking screw was replaced with the  locking screw.  The slotted hole proximally was then drilled and a nonlocking screw was inserted.  The fracture was held in a reduced position while the screw was tightened down.  The remaining two proximal holes were drilled and filled with locking bicortical screws.  A mortise fluoroscopic image was obtained.  Dorsiflexion and external rotation stress was applied.  There was widening of the syndesmosis in the medial clear space noted.  The decision was made at that point to proceed with syndesmosis fixation.  A 2.5-mm drill bit was then drilled across the fibula and tibia across the syndesmosis.  The syndesmosis was held in a reduced position while a fully-threaded 3.5-mm cortical screw was inserted.  Second parallel screw was inserted proximal to the first.  AP, mortise, and lateral views confirmed appropriate reduction of the syndesmosis and appropriate position and length of all the fibular hardware.  Attention was then turned to the medial malleolus.  K-wire was inserted percutaneously into the tip of the medial malleolus.  Medial malleolus was seemed to be well reduced.  A small stab incision was made and a 44- mm partially threaded 4-0 cannulated screw was inserted over the guidepin.  The medial malleolus fragment was too small to hold the second screw without risking fracture of the fragment.  The guidepin was removed.  Final AP, lateral, and mortise views confirmed appropriate reduction of the fracture with appropriate position and length of all hardware.  The wounds were irrigated copiously.  The deep subcutaneous tissues laterally were closed with inverted simple sutures of 3-0 Vicryl.  The skin incision was closed with horizontal mattress sutures of 3-0 nylon.  The medial and anterior stab incisions were all closed with horizontal mattress sutures of 3-0 nylon.  Sterile dressings were applied followed by a well-padded short-leg splint.  Tourniquet was released at 55  minutes after application of the dressings.  The patient was then awakened from anesthesia and transported to the recovery room in stable condition.  FOLLOWUP PLAN:  The patient will be observed overnight for pain control. He will likely be discharged back to his group home tomorrow.     Toni Arthurs, MD     JH/MEDQ  D:  02/06/2012  T:  02/07/2012  Job:  [id number]

## 2012-02-07 NOTE — Transfer of Care (Signed)
Immediate Anesthesia Transfer of Care Note  Patient: Steven [name]  Procedure(s) Performed: Procedure(s) (LRB) with comments: OPEN REDUCTION INTERNAL FIXATION (ORIF) ANKLE FRACTURE (Right) SYNDESMOSIS REPAIR (Right) - tri maleolar  Patient Location: PACU  Anesthesia Type:General  Level of Consciousness: awake, alert , oriented and patient cooperative  Airway & Oxygen Therapy: Patient Spontanous Breathing and Patient connected to nasal cannula oxygen  Post-op Assessment: Report given to PACU RN and Post -op Vital signs reviewed and stable  Post vital signs: Reviewed and stable  Complications: No apparent anesthesia complications

## 2012-04-22 ENCOUNTER — Ambulatory Visit: Payer: Medicare Other | Attending: Orthopedic Surgery | Admitting: Physical Therapy

## 2012-04-22 DIAGNOSIS — R262 Difficulty in walking, not elsewhere classified: Secondary | ICD-10-CM | POA: Insufficient documentation

## 2012-04-22 DIAGNOSIS — M2560 Stiffness of unspecified joint, not elsewhere classified: Secondary | ICD-10-CM | POA: Insufficient documentation

## 2012-04-22 DIAGNOSIS — IMO0001 Reserved for inherently not codable concepts without codable children: Secondary | ICD-10-CM | POA: Insufficient documentation

## 2012-04-24 ENCOUNTER — Ambulatory Visit: Payer: Medicare Other | Admitting: Physical Therapy

## 2012-05-04 ENCOUNTER — Ambulatory Visit: Payer: Medicare Other | Admitting: Physical Therapy

## 2012-05-06 ENCOUNTER — Ambulatory Visit: Payer: Medicare Other | Admitting: Physical Therapy

## 2012-05-08 ENCOUNTER — Ambulatory Visit: Payer: Medicare Other | Admitting: Physical Therapy

## 2012-05-11 ENCOUNTER — Ambulatory Visit: Payer: Medicare Other | Admitting: Physical Therapy

## 2012-05-13 ENCOUNTER — Ambulatory Visit: Payer: Medicare Other | Admitting: Physical Therapy

## 2012-05-15 ENCOUNTER — Ambulatory Visit: Payer: Medicare Other | Attending: Orthopedic Surgery

## 2012-05-15 DIAGNOSIS — M2560 Stiffness of unspecified joint, not elsewhere classified: Secondary | ICD-10-CM | POA: Insufficient documentation

## 2012-05-15 DIAGNOSIS — R262 Difficulty in walking, not elsewhere classified: Secondary | ICD-10-CM | POA: Insufficient documentation

## 2012-05-15 DIAGNOSIS — IMO0001 Reserved for inherently not codable concepts without codable children: Secondary | ICD-10-CM | POA: Insufficient documentation

## 2012-05-19 ENCOUNTER — Ambulatory Visit: Payer: Medicare Other | Admitting: Physical Therapy

## 2012-05-21 ENCOUNTER — Ambulatory Visit: Payer: Medicare Other | Admitting: Physical Therapy

## 2012-06-02 ENCOUNTER — Ambulatory Visit: Payer: Medicare Other | Admitting: Physical Therapy

## 2012-06-04 ENCOUNTER — Ambulatory Visit: Payer: Medicare Other | Admitting: Physical Therapy

## 2012-06-09 ENCOUNTER — Ambulatory Visit: Payer: Medicare Other | Admitting: Physical Therapy

## 2012-06-11 ENCOUNTER — Ambulatory Visit: Payer: Medicare Other | Admitting: Physical Therapy

## 2012-06-23 ENCOUNTER — Ambulatory Visit: Payer: Medicare Other | Attending: Orthopedic Surgery | Admitting: Physical Therapy

## 2012-06-23 DIAGNOSIS — R262 Difficulty in walking, not elsewhere classified: Secondary | ICD-10-CM | POA: Insufficient documentation

## 2012-06-23 DIAGNOSIS — IMO0001 Reserved for inherently not codable concepts without codable children: Secondary | ICD-10-CM | POA: Insufficient documentation

## 2012-06-23 DIAGNOSIS — M2560 Stiffness of unspecified joint, not elsewhere classified: Secondary | ICD-10-CM | POA: Insufficient documentation

## 2012-12-30 ENCOUNTER — Ambulatory Visit: Payer: Medicare Other | Attending: Family Medicine | Admitting: Physical Therapy

## 2012-12-30 DIAGNOSIS — IMO0001 Reserved for inherently not codable concepts without codable children: Secondary | ICD-10-CM | POA: Insufficient documentation

## 2012-12-30 DIAGNOSIS — F411 Generalized anxiety disorder: Secondary | ICD-10-CM | POA: Insufficient documentation

## 2012-12-30 DIAGNOSIS — R269 Unspecified abnormalities of gait and mobility: Secondary | ICD-10-CM | POA: Insufficient documentation

## 2012-12-30 DIAGNOSIS — J4489 Other specified chronic obstructive pulmonary disease: Secondary | ICD-10-CM | POA: Insufficient documentation

## 2012-12-30 DIAGNOSIS — R262 Difficulty in walking, not elsewhere classified: Secondary | ICD-10-CM | POA: Insufficient documentation

## 2012-12-30 DIAGNOSIS — I059 Rheumatic mitral valve disease, unspecified: Secondary | ICD-10-CM | POA: Insufficient documentation

## 2012-12-30 DIAGNOSIS — J449 Chronic obstructive pulmonary disease, unspecified: Secondary | ICD-10-CM | POA: Insufficient documentation

## 2013-01-01 ENCOUNTER — Ambulatory Visit: Payer: Medicare Other | Admitting: Physical Therapy

## 2013-01-04 ENCOUNTER — Ambulatory Visit: Payer: Medicare Other | Admitting: Physical Therapy

## 2013-01-06 ENCOUNTER — Ambulatory Visit: Payer: Medicare Other | Admitting: Physical Therapy

## 2013-01-15 ENCOUNTER — Encounter: Payer: Medicare Other | Admitting: Physical Therapy

## 2013-01-19 ENCOUNTER — Ambulatory Visit: Payer: Medicare Other | Attending: Family Medicine | Admitting: Physical Therapy

## 2013-01-19 DIAGNOSIS — R262 Difficulty in walking, not elsewhere classified: Secondary | ICD-10-CM | POA: Insufficient documentation

## 2013-01-19 DIAGNOSIS — IMO0001 Reserved for inherently not codable concepts without codable children: Secondary | ICD-10-CM | POA: Insufficient documentation

## 2013-01-19 DIAGNOSIS — F411 Generalized anxiety disorder: Secondary | ICD-10-CM | POA: Insufficient documentation

## 2013-01-19 DIAGNOSIS — J449 Chronic obstructive pulmonary disease, unspecified: Secondary | ICD-10-CM | POA: Insufficient documentation

## 2013-01-19 DIAGNOSIS — J4489 Other specified chronic obstructive pulmonary disease: Secondary | ICD-10-CM | POA: Insufficient documentation

## 2013-01-19 DIAGNOSIS — R269 Unspecified abnormalities of gait and mobility: Secondary | ICD-10-CM | POA: Insufficient documentation

## 2013-01-19 DIAGNOSIS — I059 Rheumatic mitral valve disease, unspecified: Secondary | ICD-10-CM | POA: Insufficient documentation

## 2013-08-07 ENCOUNTER — Inpatient Hospital Stay (HOSPITAL_COMMUNITY)
Admission: EM | Admit: 2013-08-07 | Discharge: 2013-08-14 | DRG: 871 | Disposition: A | Discharge status: Home Health | Payer: Medicare Other | Attending: Internal Medicine | Admitting: Internal Medicine

## 2013-08-07 ENCOUNTER — Encounter (HOSPITAL_COMMUNITY): Payer: Self-pay | Admitting: Emergency Medicine

## 2013-08-07 ENCOUNTER — Emergency Department (INDEPENDENT_AMBULATORY_CARE_PROVIDER_SITE_OTHER)
Admission: EM | Admit: 2013-08-07 | Discharge: 2013-08-07 | Disposition: A | Discharge status: Other Acute Inpatient Hospital | Payer: Medicare Other | Source: Home / Self Care | Attending: Family Medicine | Admitting: Family Medicine

## 2013-08-07 ENCOUNTER — Emergency Department (HOSPITAL_COMMUNITY): Payer: Medicare Other

## 2013-08-07 DIAGNOSIS — D509 Iron deficiency anemia, unspecified: Secondary | ICD-10-CM | POA: Diagnosis present

## 2013-08-07 DIAGNOSIS — F17200 Nicotine dependence, unspecified, uncomplicated: Secondary | ICD-10-CM | POA: Diagnosis present

## 2013-08-07 DIAGNOSIS — D473 Essential (hemorrhagic) thrombocythemia: Secondary | ICD-10-CM | POA: Diagnosis present

## 2013-08-07 DIAGNOSIS — R634 Abnormal weight loss: Secondary | ICD-10-CM | POA: Diagnosis present

## 2013-08-07 DIAGNOSIS — K922 Gastrointestinal hemorrhage, unspecified: Secondary | ICD-10-CM | POA: Diagnosis present

## 2013-08-07 DIAGNOSIS — K2981 Duodenitis with bleeding: Secondary | ICD-10-CM

## 2013-08-07 DIAGNOSIS — E43 Unspecified severe protein-calorie malnutrition: Secondary | ICD-10-CM | POA: Diagnosis present

## 2013-08-07 DIAGNOSIS — IMO0002 Reserved for concepts with insufficient information to code with codable children: Secondary | ICD-10-CM | POA: Diagnosis not present

## 2013-08-07 DIAGNOSIS — K269 Duodenal ulcer, unspecified as acute or chronic, without hemorrhage or perforation: Secondary | ICD-10-CM | POA: Diagnosis present

## 2013-08-07 DIAGNOSIS — N179 Acute kidney failure, unspecified: Secondary | ICD-10-CM | POA: Diagnosis not present

## 2013-08-07 DIAGNOSIS — J9600 Acute respiratory failure, unspecified whether with hypoxia or hypercapnia: Secondary | ICD-10-CM | POA: Diagnosis not present

## 2013-08-07 DIAGNOSIS — F79 Unspecified intellectual disabilities: Secondary | ICD-10-CM | POA: Diagnosis present

## 2013-08-07 DIAGNOSIS — Z79899 Other long term (current) drug therapy: Secondary | ICD-10-CM

## 2013-08-07 DIAGNOSIS — E860 Dehydration: Secondary | ICD-10-CM | POA: Diagnosis present

## 2013-08-07 DIAGNOSIS — Z7982 Long term (current) use of aspirin: Secondary | ICD-10-CM | POA: Diagnosis not present

## 2013-08-07 DIAGNOSIS — R195 Other fecal abnormalities: Secondary | ICD-10-CM

## 2013-08-07 DIAGNOSIS — Z791 Long term (current) use of non-steroidal anti-inflammatories (NSAID): Secondary | ICD-10-CM | POA: Diagnosis not present

## 2013-08-07 DIAGNOSIS — G8929 Other chronic pain: Secondary | ICD-10-CM | POA: Diagnosis present

## 2013-08-07 DIAGNOSIS — J9601 Acute respiratory failure with hypoxia: Secondary | ICD-10-CM

## 2013-08-07 DIAGNOSIS — Z66 Do not resuscitate: Secondary | ICD-10-CM | POA: Diagnosis present

## 2013-08-07 DIAGNOSIS — E876 Hypokalemia: Secondary | ICD-10-CM

## 2013-08-07 DIAGNOSIS — K5900 Constipation, unspecified: Secondary | ICD-10-CM | POA: Diagnosis not present

## 2013-08-07 DIAGNOSIS — R6520 Severe sepsis without septic shock: Secondary | ICD-10-CM

## 2013-08-07 DIAGNOSIS — R609 Edema, unspecified: Secondary | ICD-10-CM | POA: Diagnosis not present

## 2013-08-07 DIAGNOSIS — I10 Essential (primary) hypertension: Secondary | ICD-10-CM | POA: Diagnosis present

## 2013-08-07 DIAGNOSIS — J189 Pneumonia, unspecified organism: Secondary | ICD-10-CM

## 2013-08-07 DIAGNOSIS — K257 Chronic gastric ulcer without hemorrhage or perforation: Secondary | ICD-10-CM

## 2013-08-07 DIAGNOSIS — D72829 Elevated white blood cell count, unspecified: Secondary | ICD-10-CM

## 2013-08-07 DIAGNOSIS — D62 Acute posthemorrhagic anemia: Secondary | ICD-10-CM | POA: Diagnosis present

## 2013-08-07 DIAGNOSIS — E785 Hyperlipidemia, unspecified: Secondary | ICD-10-CM | POA: Diagnosis present

## 2013-08-07 DIAGNOSIS — D75839 Thrombocytosis, unspecified: Secondary | ICD-10-CM | POA: Diagnosis present

## 2013-08-07 DIAGNOSIS — D500 Iron deficiency anemia secondary to blood loss (chronic): Secondary | ICD-10-CM

## 2013-08-07 DIAGNOSIS — A419 Sepsis, unspecified organism: Secondary | ICD-10-CM | POA: Diagnosis present

## 2013-08-07 DIAGNOSIS — D649 Anemia, unspecified: Secondary | ICD-10-CM

## 2013-08-07 DIAGNOSIS — K259 Gastric ulcer, unspecified as acute or chronic, without hemorrhage or perforation: Secondary | ICD-10-CM | POA: Diagnosis present

## 2013-08-07 DIAGNOSIS — R630 Anorexia: Secondary | ICD-10-CM | POA: Diagnosis present

## 2013-08-07 DIAGNOSIS — K921 Melena: Secondary | ICD-10-CM

## 2013-08-07 LAB — CBC WITH DIFFERENTIAL/PLATELET
BASOS PCT: 0 % (ref 0–1)
Basophils Absolute: 0 10*3/uL (ref 0.0–0.1)
EOS PCT: 0 % (ref 0–5)
Eosinophils Absolute: 0 10*3/uL (ref 0.0–0.7)
HCT: 17.8 % — ABNORMAL LOW (ref 39.0–52.0)
HEMOGLOBIN: 5.3 g/dL — AB (ref 13.0–17.0)
LYMPHS ABS: 1.4 10*3/uL (ref 0.7–4.0)
Lymphocytes Relative: 8 % — ABNORMAL LOW (ref 12–46)
MCH: 23.6 pg — AB (ref 26.0–34.0)
MCHC: 29.8 g/dL — ABNORMAL LOW (ref 30.0–36.0)
MCV: 79.1 fL (ref 78.0–100.0)
MONO ABS: 0.9 10*3/uL (ref 0.1–1.0)
MONOS PCT: 5 % (ref 3–12)
NEUTROS PCT: 87 % — AB (ref 43–77)
Neutro Abs: 15.8 10*3/uL — ABNORMAL HIGH (ref 1.7–7.7)
Platelets: 992 10*3/uL (ref 150–400)
RBC: 2.25 MIL/uL — AB (ref 4.22–5.81)
RDW: 15.5 % (ref 11.5–15.5)
WBC: 18.1 10*3/uL — AB (ref 4.0–10.5)

## 2013-08-07 LAB — URINALYSIS, ROUTINE W REFLEX MICROSCOPIC
BILIRUBIN URINE: NEGATIVE
GLUCOSE, UA: NEGATIVE mg/dL
HGB URINE DIPSTICK: NEGATIVE
KETONES UR: NEGATIVE mg/dL
LEUKOCYTES UA: NEGATIVE
Nitrite: NEGATIVE
PH: 5 (ref 5.0–8.0)
PROTEIN: NEGATIVE mg/dL
Specific Gravity, Urine: 1.019 (ref 1.005–1.030)
Urobilinogen, UA: 0.2 mg/dL (ref 0.0–1.0)

## 2013-08-07 LAB — COMPREHENSIVE METABOLIC PANEL
ALK PHOS: 85 U/L (ref 39–117)
ALT: 24 U/L (ref 0–53)
AST: 18 U/L (ref 0–37)
Albumin: 2 g/dL — ABNORMAL LOW (ref 3.5–5.2)
Anion gap: 15 (ref 5–15)
BILIRUBIN TOTAL: 0.2 mg/dL — AB (ref 0.3–1.2)
BUN: 46 mg/dL — ABNORMAL HIGH (ref 6–23)
CHLORIDE: 99 meq/L (ref 96–112)
CO2: 25 meq/L (ref 19–32)
CREATININE: 1.32 mg/dL (ref 0.50–1.35)
Calcium: 8.4 mg/dL (ref 8.4–10.5)
GFR calc Af Amer: 67 mL/min — ABNORMAL LOW (ref >90)
GFR, EST NON AFRICAN AMERICAN: 57 mL/min — AB (ref >90)
Glucose, Bld: 130 mg/dL — ABNORMAL HIGH (ref 70–99)
POTASSIUM: 4.3 meq/L (ref 3.7–5.3)
Sodium: 139 mEq/L (ref 137–147)
Total Protein: 6.1 g/dL (ref 6.0–8.3)

## 2013-08-07 LAB — CBC
HEMATOCRIT: 13.1 % — AB (ref 39.0–52.0)
HEMOGLOBIN: 3.9 g/dL — AB (ref 13.0–17.0)
MCH: 23.4 pg — AB (ref 26.0–34.0)
MCHC: 29.8 g/dL — AB (ref 30.0–36.0)
MCV: 78.4 fL (ref 78.0–100.0)
Platelets: 951 10*3/uL (ref 150–400)
RBC: 1.67 MIL/uL — AB (ref 4.22–5.81)
RDW: 15.5 % (ref 11.5–15.5)
WBC: 15.3 10*3/uL — ABNORMAL HIGH (ref 4.0–10.5)

## 2013-08-07 LAB — POC OCCULT BLOOD, ED: Fecal Occult Bld: POSITIVE — AB

## 2013-08-07 LAB — I-STAT CG4 LACTIC ACID, ED: Lactic Acid, Venous: 3.18 mmol/L — ABNORMAL HIGH (ref 0.5–2.2)

## 2013-08-07 LAB — RETICULOCYTES
RBC.: 1.71 MIL/uL — ABNORMAL LOW (ref 4.22–5.81)
Retic Count, Absolute: 65 10*3/uL (ref 19.0–186.0)
Retic Ct Pct: 3.8 % — ABNORMAL HIGH (ref 0.4–3.1)

## 2013-08-07 LAB — PREPARE RBC (CROSSMATCH)

## 2013-08-07 MED ORDER — ACETAMINOPHEN 650 MG RE SUPP
650.0000 mg | Freq: Four times a day (QID) | RECTAL | Status: DC | PRN
  Administered 2013-08-08: 650 mg via RECTAL
  Filled 2013-08-07: qty 1

## 2013-08-07 MED ORDER — SODIUM CHLORIDE 0.9 % IV BOLUS (SEPSIS)
1000.0000 mL | Freq: Once | INTRAVENOUS | Status: AC
  Administered 2013-08-07: 1000 mL via INTRAVENOUS

## 2013-08-07 MED ORDER — PANTOPRAZOLE SODIUM 40 MG IV SOLR: 40.0000 mg | Freq: Two times a day (BID) | INTRAVENOUS | Status: DC

## 2013-08-07 MED ORDER — ONDANSETRON HCL 4 MG PO TABS: 4.0000 mg | ORAL_TABLET | Freq: Four times a day (QID) | ORAL | Status: DC | PRN

## 2013-08-07 MED ORDER — SODIUM CHLORIDE 0.9 % IV SOLN
8.0000 mg/h | INTRAVENOUS | Status: DC
  Administered 2013-08-08 – 2013-08-09 (×4): 8 mg/h via INTRAVENOUS
  Filled 2013-08-07 (×7): qty 80

## 2013-08-07 MED ORDER — ACETAMINOPHEN 325 MG PO TABS: 650.0000 mg | ORAL_TABLET | Freq: Four times a day (QID) | ORAL | Status: DC | PRN

## 2013-08-07 MED ORDER — SODIUM CHLORIDE 0.9 % IV SOLN: INTRAVENOUS | Status: DC

## 2013-08-07 MED ORDER — CHLORHEXIDINE GLUCONATE 0.12 % MT SOLN
10.0000 mL | Freq: Three times a day (TID) | OROMUCOSAL | Status: DC
  Administered 2013-08-07 – 2013-08-12 (×12): 10 mL via OROMUCOSAL
  Filled 2013-08-07 (×17): qty 15

## 2013-08-07 MED ORDER — SODIUM CHLORIDE 0.9 % IV SOLN
INTRAVENOUS | Status: AC
  Administered 2013-08-07: 23:00:00 via INTRAVENOUS

## 2013-08-07 MED ORDER — ONDANSETRON HCL 4 MG/2ML IJ SOLN: 4.0000 mg | Freq: Four times a day (QID) | INTRAMUSCULAR | Status: DC | PRN

## 2013-08-07 MED ORDER — LEVOFLOXACIN IN D5W 750 MG/150ML IV SOLN
750.0000 mg | INTRAVENOUS | Status: DC
  Administered 2013-08-07: 750 mg via INTRAVENOUS
  Filled 2013-08-07 (×2): qty 150

## 2013-08-07 MED ORDER — SODIUM CHLORIDE 0.9 % IV SOLN
80.0000 mg | Freq: Once | INTRAVENOUS | Status: AC
  Administered 2013-08-08: 80 mg via INTRAVENOUS
  Filled 2013-08-07: qty 80

## 2013-08-07 NOTE — H&P (Signed)
[facility] History and Physical  Steven [name] HCW:[id number] DOB: 1955/09/08 DOA: 08/07/2013  Referring physician: ER physician. PCP: [name], MD   Chief Complaint: Poor appetite.  HPI: Steven [name] is a 58 y.o. male with history of mental retardation and hyperlipidemia was urgently brought to the urgent care Center because patient was having poor appetite and not eating well. As per the patient's caregiver and patient's sister with whom I spoke patient has been losing weight over the last few weeks and has been hardly eating anything. Patient was referred to the ER and in the ER patient was initially found to be mildly hypertensive and blood work showed leukocytosis with thrombocytosis and severe anemia. Stool for blood was positive though not melanotic as per the ER physician. Patient is on diclofenac for chronic pain. Patient was given fluid bolus following which at this time PRBC transfusion has been arranged. I have discussed with on-call gastroenterologist Dr. Benson Norway who will be seeing patient in consult. Patient is not very communicative and does not provide much history. As per the caregiver and patient's sister patient has not had any nausea vomiting abdominal pain diarrhea chest pain cough fever chills. Patient chest x-ray shows possible pneumonia.   Review of Systems: As presented in the history of presenting illness, rest negative.  Past Medical History  Diagnosis Date  . Hyperlipidemia   . Anxiety   . Mental retardation     ue to temp of 108 aftrer having measles age 29.  . Hx of diaper rash   . Edema of foot 02/06/2012    BILATERAL   Past Surgical History  Procedure Laterality Date  . Orif ankle fracture  02/06/2012    RIGHT ANKLE  . Orif ankle fracture  02/06/2012    Procedure: OPEN REDUCTION INTERNAL FIXATION (ORIF) ANKLE FRACTURE;  Surgeon: Wylene Simmer, MD;  Location: [place];  Service: Orthopedics;  Laterality: Right;  . Syndesmosis repair  02/06/2012     Procedure: SYNDESMOSIS REPAIR;  Surgeon: Wylene Simmer, MD;  Location: [place];  Service: Orthopedics;  Laterality: Right;  tri maleolar   Social History:  reports that he has been smoking Cigarettes.  He has a 7.5 pack-year smoking history. He has never used smokeless tobacco. He reports that he does not drink alcohol or use illicit drugs. Where does patient live group home. Can patient participate in ADLs? No.  Allergies  Allergen Reactions  . Haldol [Haloperidol Lactate] Palpitations    Sleeps for days    Family History:  Family History  Problem Relation Age of Onset  . Diabetes Mellitus II Mother   . Diabetes Mellitus II Father   . Lung cancer Sister   . Lung cancer Brother   . CAD Brother       Prior to Admission medications   Medication Sig Start Date End Date Taking? Authorizing Provider  atorvastatin (LIPITOR) 10 MG tablet Take 10 mg by mouth daily.   Yes Historical Provider, MD  chlorhexidine (PERIDEX) 0.12 % solution Use as directed 10 mLs in the mouth or throat 3 (three) times daily. Apply to teeth and gums with toothbrush (staff hand over hand)   Yes Historical Provider, MD  cholecalciferol (VITAMIN D) 1000 UNITS tablet Take 2,000 Units by mouth daily.   Yes Historical Provider, MD  diclofenac (VOLTAREN) 75 MG EC tablet Take 75 mg by mouth 2 (two) times daily.   Yes Historical Provider, MD  furosemide (LASIX) 40 MG tablet Take 40 mg by mouth daily.  Yes Historical Provider, MD  psyllium (METAMUCIL) 58.6 % powder Take 1 packet by mouth daily.   Yes Historical Provider, MD  aspirin EC 325 MG EC tablet Take 1 tablet (325 mg total) by mouth daily. 02/07/12   Wylene Simmer, MD    Physical Exam: Filed Vitals:   08/07/13 1915 08/07/13 1935 08/07/13 1945 08/07/13 2015  BP: 101/45 95/55 72/45  94/62  Pulse: 104  91 106  Temp:  97.9 F (36.6 C)    TempSrc:  Rectal    Resp: 12 18    SpO2: 99% 96% 100% 96%     General:  Moderately built and nourished.  Eyes: Anicteric  pallor present.  ENT: No discharge from the ears eyes nose mouth.  Neck: No mass felt.  Cardiovascular: S1-S2 heard.  Respiratory: No rhonchi or crepitations.  Abdomen: Soft nontender bowel sounds present. No guarding or rigidity.  Skin: Skin is pale.  Musculoskeletal: Bilateral lower extremity edema. Contractures.  Psychiatric: Patient has mental retardation.  Neurologic: Patient has mental retardation and contractures.  Labs on Admission:  Basic Metabolic Panel:  Recent Labs Lab 08/07/13 1735  NA 139  K 4.3  CL 99  CO2 25  GLUCOSE 130*  BUN 46*  CREATININE 1.32  CALCIUM 8.4   Liver Function Tests:  Recent Labs Lab 08/07/13 1735  AST 18  ALT 24  ALKPHOS 85  BILITOT 0.2*  PROT 6.1  ALBUMIN 2.0*   No results found for this basename: LIPASE, AMYLASE,  in the last 168 hours No results found for this basename: AMMONIA,  in the last 168 hours CBC:  Recent Labs Lab 08/07/13 1735  WBC 18.1*  NEUTROABS 15.8*  HGB 5.3*  HCT 17.8*  MCV 79.1  PLT 992*   Cardiac Enzymes: No results found for this basename: CKTOTAL, CKMB, CKMBINDEX, TROPONINI,  in the last 168 hours  BNP (last 3 results) No results found for this basename: PROBNP,  in the last 8760 hours CBG: No results found for this basename: GLUCAP,  in the last 168 hours  Radiological Exams on Admission: Dg Chest 2 View  08/07/2013   CLINICAL DATA:  Rule out infection  EXAM: CHEST  2 VIEW  COMPARISON:  01/27/2012.  FINDINGS: Low volume film with airspace disease in the right mid lung compatible with pneumonia. Left lung is clear. Left upper lobe nodular density seen on the previous study is not evident on today's exam. The cardio pericardial silhouette is enlarged. No edema or pleural effusion. Bones are diffusely demineralized.  IMPRESSION: Right midlung airspace disease, suspicious for pneumonia.   Electronically Signed   By: Misty Stanley M.D.   On: 08/07/2013 21:04     Assessment/Plan Active  Problems:   GI bleed   Community acquired pneumonia   Leucocytosis   Thrombocytosis   Anemia   1. GI bleed - most likely upper GI given that patient is on diclofenac. Patient has been started on Protonix infusion and packed red blood cell transfusions have been ordered. GI Dr. Benson Norway has been counseled and I have also consulted critical care for central line placement. Follow CBC after transfusion. Anemia panel is pending. 2. Severe anemia from GI bleed - follow CBC and anemia panel. 3. Pneumonia - chest x-ray shows pneumonic process for which Levaquin has been started. 4. Leukocytosis - probably reactionary and also secondary to #3. Blood cultures have been ordered and antibiotics started. UA is pending. 5. Chronic lower extremity edema - as per the caregiver patient has chronic lower  extremity edema. May be related to a deficient. Check Dopplers. 6. Protein calorie malnutrition - will need some nutritional supplements after the acute bleeding episode. 7. Severe thrombocytosis - may be reactionary to anemia. Closely follow platelet counts and if persistent will need dermatologist opinion.    Code Status: DO NOT RESUSCITATE.  Family Communication: Patient's sister.  Disposition Plan: Admit to inpatient.    Young Brim N. [facility] Pager [phone].  If 7PM-7AM, please contact night-coverage [email or web address] Password [email or web address] 08/07/2013, 9:35 PM

## 2013-08-07 NOTE — ED Notes (Signed)
Pt in with caregiver stating that over the last two weeks patient has not been eating well, went to PMD and had a negative chest xray, symptoms have continued, nurse from group home saw patient today and wanted him to come in for evaluation since symptoms continue. Went to urgent care and they were sent here for further evaluation.

## 2013-08-07 NOTE — ED Notes (Signed)
Hospitalist at bedside 

## 2013-08-07 NOTE — ED Notes (Signed)
Pt  Is  A  Resident  Of An [facility]    He  Is  W/c  Bound          And  According  To      Staff  He  Has  Not  Been  Eating  Well  And  Has  Lost  Weight      They  Say  The  Dietician        Has seen  Him     Recently         Staff  Members  Say    They  Were  Advised  To bring  Pt  For  evaul            Pt  Has  Eyes  Open  And  Has  Darting  Movements  Of the  Tongue

## 2013-08-07 NOTE — ED Provider Notes (Signed)
CSN: [id number]     Arrival date & time 08/07/13  1252 History   First MD Initiated Contact with Patient 08/07/13 1621     Chief Complaint  Patient presents with  . Not eating well      (Consider location/radiation/quality/duration/timing/severity/associated sxs/prior Treatment) Patient is a 58 y.o. male presenting with general illness.  Illness Location:  Generalized Quality:  Loss of appetite Severity:  Moderate Onset quality:  Gradual Duration:  3 weeks Timing:  Constant Progression:  Worsening Chronicity:  New Context:  Decreased appetite Relieved by:  None Associated symptoms: no abdominal pain, no chest pain, no nausea and no vomiting     Past Medical History  Diagnosis Date  . Hyperlipidemia   . Anxiety   . Mental retardation     ue to temp of 108 aftrer having measles age 18.  . Hx of diaper rash   . Edema of foot 02/06/2012    BILATERAL   Past Surgical History  Procedure Laterality Date  . Orif ankle fracture  02/06/2012    RIGHT ANKLE  . Orif ankle fracture  02/06/2012    Procedure: OPEN REDUCTION INTERNAL FIXATION (ORIF) ANKLE FRACTURE;  Surgeon: Wylene Simmer, MD;  Location: [place];  Service: Orthopedics;  Laterality: Right;  . Syndesmosis repair  02/06/2012    Procedure: SYNDESMOSIS REPAIR;  Surgeon: Wylene Simmer, MD;  Location: [place];  Service: Orthopedics;  Laterality: Right;  tri maleolar   Family History  Problem Relation Age of Onset  . Diabetes Mellitus II Mother   . Diabetes Mellitus II Father   . Lung cancer Sister   . Lung cancer Brother   . CAD Brother    History  Substance Use Topics  . Smoking status: Current Every Day Smoker -- 0.50 packs/day for 15 years    Types: Cigarettes  . Smokeless tobacco: Never Used  . Alcohol Use: No    Review of Systems  Unable to perform ROS: Other  Constitutional: Positive for appetite change and unexpected weight change.  Cardiovascular: Negative for chest pain.  Gastrointestinal: Negative for nausea,  vomiting and abdominal pain.      Allergies  Haldol  Home Medications   Prior to Admission medications   Medication Sig Start Date End Date Taking? Authorizing Provider  atorvastatin (LIPITOR) 10 MG tablet Take 10 mg by mouth daily.   Yes Historical Provider, MD  chlorhexidine (PERIDEX) 0.12 % solution Use as directed 10 mLs in the mouth or throat 3 (three) times daily. Apply to teeth and gums with toothbrush (staff hand over hand)   Yes Historical Provider, MD  cholecalciferol (VITAMIN D) 1000 UNITS tablet Take 2,000 Units by mouth daily.   Yes Historical Provider, MD  diclofenac (VOLTAREN) 75 MG EC tablet Take 75 mg by mouth 2 (two) times daily.   Yes Historical Provider, MD  furosemide (LASIX) 40 MG tablet Take 40 mg by mouth daily.   Yes Historical Provider, MD  psyllium (METAMUCIL) 58.6 % powder Take 1 packet by mouth daily.   Yes Historical Provider, MD  aspirin EC 325 MG EC tablet Take 1 tablet (325 mg total) by mouth daily. 02/07/12   Wylene Simmer, MD   BP 86/51  Pulse 113  Temp(Src) 99.3 F (37.4 C) (Axillary)  Resp 40  Wt 133 lb 6.1 oz (60.5 kg)  SpO2 94% Physical Exam  Constitutional: He appears cachectic. He appears ill. No distress.  HENT:  Head: Normocephalic and atraumatic.  Eyes: Pupils are equal, round, and reactive to  light.  Neck: Normal range of motion.  Cardiovascular: Normal rate and regular rhythm.   Pulmonary/Chest: Effort normal and breath sounds normal.  Abdominal: Soft. He exhibits no distension. There is no tenderness.  Musculoskeletal: Normal range of motion.       Right lower leg: He exhibits swelling.       Left lower leg: He exhibits swelling.  Neurological: He is alert.  Patient at neurologic baseline per caretakers. Alert, looking around, nods to responses to questions, does not verbalize response, able to follow directions.   Skin: Skin is warm. He is not diaphoretic.    ED Course  Procedures (including critical care time) Labs  Review Labs Reviewed  CBC WITH DIFFERENTIAL - Abnormal; Notable for the following:    WBC 18.1 (*)    RBC 2.25 (*)    Hemoglobin 5.3 (*)    HCT 17.8 (*)    MCH 23.6 (*)    MCHC 29.8 (*)    Platelets 992 (*)    Neutrophils Relative % 87 (*)    Lymphocytes Relative 8 (*)    Neutro Abs 15.8 (*)    All other components within normal limits  COMPREHENSIVE METABOLIC PANEL - Abnormal; Notable for the following:    Glucose, Bld 130 (*)    BUN 46 (*)    Albumin 2.0 (*)    Total Bilirubin 0.2 (*)    GFR calc non Af Amer 57 (*)    GFR calc Af Amer 67 (*)    All other components within normal limits  CBC - Abnormal; Notable for the following:    WBC 15.3 (*)    RBC 1.67 (*)    Hemoglobin 3.9 (*)    HCT 13.1 (*)    MCH 23.4 (*)    MCHC 29.8 (*)    Platelets 951 (*)    All other components within normal limits  RETICULOCYTES - Abnormal; Notable for the following:    Retic Ct Pct 3.8 (*)    RBC. 1.71 (*)    All other components within normal limits  POC OCCULT BLOOD, ED - Abnormal; Notable for the following:    Fecal Occult Bld POSITIVE (*)    All other components within normal limits  I-STAT CG4 LACTIC ACID, ED - Abnormal; Notable for the following:    Lactic Acid, Venous 3.18 (*)    All other components within normal limits  MRSA PCR SCREENING  CULTURE, BLOOD (ROUTINE X 2)  CULTURE, BLOOD (ROUTINE X 2)  URINALYSIS, ROUTINE W REFLEX MICROSCOPIC  PATHOLOGIST SMEAR REVIEW  VITAMIN B12  FOLATE  IRON AND TIBC  FERRITIN  PATHOLOGIST SMEAR REVIEW  COMPREHENSIVE METABOLIC PANEL  CBC WITH DIFFERENTIAL  POC OCCULT BLOOD, ED  TYPE AND SCREEN  PREPARE RBC (CROSSMATCH)  ABO/RH    Imaging Review Dg Chest 2 View  08/07/2013   CLINICAL DATA:  Rule out infection  EXAM: CHEST  2 VIEW  COMPARISON:  01/27/2012.  FINDINGS: Low volume film with airspace disease in the right mid lung compatible with pneumonia. Left lung is clear. Left upper lobe nodular density seen on the previous study is  not evident on today's exam. The cardio pericardial silhouette is enlarged. No edema or pleural effusion. Bones are diffusely demineralized.  IMPRESSION: Right midlung airspace disease, suspicious for pneumonia.   Electronically Signed   By: Misty Stanley M.D.   On: 08/07/2013 21:04     EKG Interpretation None      MDM   Final diagnoses:  Symptomatic anemia   58 year old male with a history of mental retardation presents today for decreased appetite for 2 weeks. Patient has been seen and transferred from urgent care for the symptoms.  Upon arrival the patient is hemodynamically stable and appears in no acute distress. His heart rate is minimally elevated between 100 and 110s. Patient is breathing normally and has stable blood pressures. Given the patient's stable condition, plan is to obtain basic labs to rule out any evidence of dehydration or possible electrolyte abnormalities given the patient's poor nutritional status. Patient appears mildly dehydrated and we will give fluids.  Patient x-ray with right midlung airspace disease. Patient has a CMP is unremarkable. Patient's CBC with differential demonstrates a leukocytosis with anemia with a hemoglobin of 5.3 and high platelets. This was a repeat CBC. Plan to type and cross the patient arrived for 2 units of packed red blood cells. Hemoccult check was positive. Lactic acid was positive. Given the patient's possible lower GI bleed and his low hemoglobin count, this may account for symptoms of the last several weeks. I consulted with the hospitalist for dimension of this patient, and the patient has been triaged to the step down unit. Patient admitted in in stable condition. Patient seen and evaluated by myself and by the attending Dr. Jeneen Rinks.     Freddi Che, MD 08/08/13 [phone]

## 2013-08-07 NOTE — ED Provider Notes (Signed)
CSN: [id number]     Arrival date & time 08/07/13  1159 History   First MD Initiated Contact with Patient 08/07/13 1206     Chief Complaint  Patient presents with  . Weight Loss   (Consider location/radiation/quality/duration/timing/severity/associated sxs/prior Treatment) Patient is a 58 y.o. male presenting with general illness. The history is provided by a caregiver.  Illness Severity:  Mild Chronicity:  New Context:  Per caregivers won't eat for 2wks,lossing wt, no fever, no vomiting, has changed diet at facility but still not eating.no apparent distress, Associated symptoms: no abdominal pain, no chest pain, no fever, no nausea and no vomiting     Past Medical History  Diagnosis Date  . Hyperlipidemia   . Anxiety   . Mental retardation     ue to temp of 108 aftrer having measles age 85.  . Hx of diaper rash   . Edema of foot 02/06/2012    BILATERAL   Past Surgical History  Procedure Laterality Date  . Orif ankle fracture  02/06/2012    RIGHT ANKLE  . Orif ankle fracture  02/06/2012    Procedure: OPEN REDUCTION INTERNAL FIXATION (ORIF) ANKLE FRACTURE;  Surgeon: Wylene Simmer, MD;  Location: [place];  Service: Orthopedics;  Laterality: Right;  . Syndesmosis repair  02/06/2012    Procedure: SYNDESMOSIS REPAIR;  Surgeon: Wylene Simmer, MD;  Location: [place];  Service: Orthopedics;  Laterality: Right;  tri maleolar   History reviewed. No pertinent family history. History  Substance Use Topics  . Smoking status: Current Every Day Smoker -- 0.50 packs/day for 15 years    Types: Cigarettes  . Smokeless tobacco: Never Used  . Alcohol Use: No    Review of Systems  Constitutional: Positive for appetite change. Negative for fever.  Respiratory: Negative.   Cardiovascular: Negative.  Negative for chest pain and palpitations.  Gastrointestinal: Negative for nausea, vomiting and abdominal pain.    Allergies  Haldol  Home Medications   Prior to Admission medications   Medication  Sig Start Date End Date Taking? Authorizing Provider  aspirin EC 325 MG EC tablet Take 1 tablet (325 mg total) by mouth daily. 02/07/12   Wylene Simmer, MD  atorvastatin (LIPITOR) 10 MG tablet Take 10 mg by mouth daily.    Historical Provider, MD  chlorhexidine (PERIDEX) 0.12 % solution Use as directed 10 mLs in the mouth or throat 3 (three) times daily. Apply to teeth and gums with toothbrush (staff hand over hand)    Historical Provider, MD  cholecalciferol (VITAMIN D) 1000 UNITS tablet Take 2,000 Units by mouth daily.    Historical Provider, MD  docusate sodium 100 MG CAPS Take 100 mg by mouth 2 (two) times daily. 02/07/12   Wylene Simmer, MD  furosemide (LASIX) 40 MG tablet Take 40 mg by mouth daily.    Historical Provider, MD  oxyCODONE (OXY IR/ROXICODONE) 5 MG immediate release tablet Take 1 tablet (5 mg total) by mouth every 4 (four) hours as needed for pain. 02/07/12   Wylene Simmer, MD  psyllium (METAMUCIL) 58.6 % powder Take 1 packet by mouth daily.    Historical Provider, MD  senna (SENOKOT) 8.6 MG TABS Take 2 tablets (17.2 mg total) by mouth 2 (two) times daily. 02/07/12   Wylene Simmer, MD   BP 116/78  Pulse 112  Temp(Src) 98 F (36.7 C) (Axillary)  Resp 18 Physical Exam  Nursing note and vitals reviewed. Constitutional: Vital signs are normal. He appears cachectic. He is uncooperative. He appears ill.  Neck: Normal range of motion. Neck supple.  Cardiovascular: Normal heart sounds and intact distal pulses.   Pulmonary/Chest: Breath sounds normal.  Lymphadenopathy:    He has no cervical adenopathy.  Skin: Skin is warm and dry.    ED Course  Procedures (including critical care time) Labs Review Labs Reviewed - No data to display  Imaging Review No results found.   MDM   1. Recent unexplained weight loss    Sent for wt loss, not eating for 2 wks. No fever, no vomiting.    Billy Fischer, MD 08/07/13 [phone]

## 2013-08-07 NOTE — ED Notes (Signed)
Dr. Jeneen Rinks notified of critical hgb

## 2013-08-08 ENCOUNTER — Inpatient Hospital Stay (HOSPITAL_COMMUNITY): Payer: Medicare Other

## 2013-08-08 DIAGNOSIS — J189 Pneumonia, unspecified organism: Secondary | ICD-10-CM

## 2013-08-08 DIAGNOSIS — A419 Sepsis, unspecified organism: Secondary | ICD-10-CM

## 2013-08-08 DIAGNOSIS — D62 Acute posthemorrhagic anemia: Secondary | ICD-10-CM

## 2013-08-08 DIAGNOSIS — K921 Melena: Secondary | ICD-10-CM

## 2013-08-08 LAB — CBC WITH DIFFERENTIAL/PLATELET
BASOS ABS: 0 10*3/uL (ref 0.0–0.1)
Basophils Absolute: 0 10*3/uL (ref 0.0–0.1)
Basophils Relative: 0 % (ref 0–1)
Basophils Relative: 0 % (ref 0–1)
EOS PCT: 0 % (ref 0–5)
Eosinophils Absolute: 0 10*3/uL (ref 0.0–0.7)
Eosinophils Absolute: 0 10*3/uL (ref 0.0–0.7)
Eosinophils Relative: 0 % (ref 0–5)
HCT: 20.7 % — ABNORMAL LOW (ref 39.0–52.0)
HCT: 23.9 % — ABNORMAL LOW (ref 39.0–52.0)
HEMOGLOBIN: 7.9 g/dL — AB (ref 13.0–17.0)
Hemoglobin: 6.5 g/dL — CL (ref 13.0–17.0)
LYMPHS PCT: 2 % — AB (ref 12–46)
LYMPHS PCT: 6 % — AB (ref 12–46)
Lymphs Abs: 0.6 10*3/uL — ABNORMAL LOW (ref 0.7–4.0)
Lymphs Abs: 1.3 10*3/uL (ref 0.7–4.0)
MCH: 26.7 pg (ref 26.0–34.0)
MCH: 27.6 pg (ref 26.0–34.0)
MCHC: 31.4 g/dL (ref 30.0–36.0)
MCHC: 33.1 g/dL (ref 30.0–36.0)
MCV: 85.2 fL (ref 78.0–100.0)
MCV: 83.6 fL (ref 78.0–100.0)
MONOS PCT: 3 % (ref 3–12)
Monocytes Absolute: 0.8 10*3/uL (ref 0.1–1.0)
Monocytes Absolute: 0.6 10*3/uL (ref 0.1–1.0)
Monocytes Relative: 3 % (ref 3–12)
NEUTROS PCT: 95 % — AB (ref 43–77)
NEUTROS PCT: 91 % — AB (ref 43–77)
Neutro Abs: 26.3 10*3/uL — ABNORMAL HIGH (ref 1.7–7.7)
Neutro Abs: 19 10*3/uL — ABNORMAL HIGH (ref 1.7–7.7)
PLATELETS: 611 10*3/uL — AB (ref 150–400)
Platelets: 498 10*3/uL — ABNORMAL HIGH (ref 150–400)
RBC: 2.43 MIL/uL — AB (ref 4.22–5.81)
RBC: 2.86 MIL/uL — AB (ref 4.22–5.81)
RDW: 16.8 % — AB (ref 11.5–15.5)
RDW: 15.7 % — ABNORMAL HIGH (ref 11.5–15.5)
WBC MORPHOLOGY: INCREASED
WBC: 27.7 10*3/uL — AB (ref 4.0–10.5)
WBC: 20.9 10*3/uL — AB (ref 4.0–10.5)

## 2013-08-08 LAB — GLUCOSE, CAPILLARY
GLUCOSE-CAPILLARY: 114 mg/dL — AB (ref 70–99)
GLUCOSE-CAPILLARY: 110 mg/dL — AB (ref 70–99)
Glucose-Capillary: 111 mg/dL — ABNORMAL HIGH (ref 70–99)
Glucose-Capillary: 126 mg/dL — ABNORMAL HIGH (ref 70–99)
Glucose-Capillary: 116 mg/dL — ABNORMAL HIGH (ref 70–99)
Glucose-Capillary: 113 mg/dL — ABNORMAL HIGH (ref 70–99)

## 2013-08-08 LAB — COMPREHENSIVE METABOLIC PANEL
ALT: 18 U/L (ref 0–53)
ANION GAP: 16 — AB (ref 5–15)
AST: 15 U/L (ref 0–37)
Albumin: 1.5 g/dL — ABNORMAL LOW (ref 3.5–5.2)
Alkaline Phosphatase: 67 U/L (ref 39–117)
BUN: 41 mg/dL — AB (ref 6–23)
CALCIUM: 7.3 mg/dL — AB (ref 8.4–10.5)
CO2: 21 mEq/L (ref 19–32)
CREATININE: 0.9 mg/dL (ref 0.50–1.35)
Chloride: 107 mEq/L (ref 96–112)
GFR calc Af Amer: >90 mL/min (ref >90)
Glucose, Bld: 111 mg/dL — ABNORMAL HIGH (ref 70–99)
Potassium: 3.5 mEq/L — ABNORMAL LOW (ref 3.7–5.3)
Sodium: 144 mEq/L (ref 137–147)
Total Bilirubin: 0.8 mg/dL (ref 0.3–1.2)
Total Protein: 4.7 g/dL — ABNORMAL LOW (ref 6.0–8.3)

## 2013-08-08 LAB — ABO/RH: ABO/RH(D): O POS

## 2013-08-08 LAB — IRON AND TIBC: UIBC: 285 ug/dL (ref 125–400)

## 2013-08-08 LAB — VITAMIN B12: Vitamin B-12: 1109 pg/mL — ABNORMAL HIGH (ref 211–911)

## 2013-08-08 LAB — FERRITIN: FERRITIN: 23 ng/mL (ref 22–322)

## 2013-08-08 LAB — PROCALCITONIN: PROCALCITONIN: 6.78 ng/mL

## 2013-08-08 LAB — FOLATE: Folate: 7.2 ng/mL

## 2013-08-08 LAB — MRSA PCR SCREENING: MRSA BY PCR: NEGATIVE

## 2013-08-08 LAB — PREPARE RBC (CROSSMATCH)

## 2013-08-08 MED ORDER — POTASSIUM CHLORIDE 10 MEQ/100ML IV SOLN
10.0000 meq | Freq: Once | INTRAVENOUS | Status: AC
Start: 2013-08-08 — End: 2013-08-08
  Administered 2013-08-08: 10 meq via INTRAVENOUS
  Filled 2013-08-08: qty 100

## 2013-08-08 MED ORDER — PIPERACILLIN-TAZOBACTAM 3.375 G IVPB
3.3750 g | Freq: Three times a day (TID) | INTRAVENOUS | Status: DC
  Administered 2013-08-08 – 2013-08-12 (×12): 3.375 g via INTRAVENOUS
  Filled 2013-08-08 (×15): qty 50

## 2013-08-08 MED ORDER — PEG 3350-KCL-NA BICARB-NACL 420 G PO SOLR
4000.0000 mL | Freq: Once | ORAL | Status: AC
  Administered 2013-08-08: 4000 mL via ORAL
  Filled 2013-08-08: qty 4000

## 2013-08-08 MED ORDER — METRONIDAZOLE IN NACL 5-0.79 MG/ML-% IV SOLN
500.0000 mg | Freq: Three times a day (TID) | INTRAVENOUS | Status: DC
  Administered 2013-08-08: 500 mg via INTRAVENOUS
  Filled 2013-08-08 (×3): qty 100

## 2013-08-08 MED ORDER — SODIUM CHLORIDE 0.9 % IV SOLN: INTRAVENOUS | Status: DC

## 2013-08-08 MED ORDER — VANCOMYCIN HCL IN DEXTROSE 750-5 MG/150ML-% IV SOLN
750.0000 mg | Freq: Two times a day (BID) | INTRAVENOUS | Status: DC
  Administered 2013-08-08 – 2013-08-10 (×5): 750 mg via INTRAVENOUS
  Filled 2013-08-08 (×6): qty 150

## 2013-08-08 MED ORDER — BIOTENE DRY MOUTH MT LIQD
15.0000 mL | Freq: Two times a day (BID) | OROMUCOSAL | Status: DC
  Administered 2013-08-08 – 2013-08-11 (×8): 15 mL via OROMUCOSAL

## 2013-08-08 MED ORDER — SODIUM CHLORIDE 0.9 % IV SOLN
INTRAVENOUS | Status: DC
  Administered 2013-08-08: 17:00:00 via INTRAVENOUS

## 2013-08-08 MED ORDER — SODIUM CHLORIDE 0.9 % IV SOLN
3.0000 g | Freq: Four times a day (QID) | INTRAVENOUS | Status: DC
  Administered 2013-08-08 (×2): 3 g via INTRAVENOUS
  Filled 2013-08-08 (×4): qty 3

## 2013-08-08 MED ORDER — ALBUMIN HUMAN 5 % IV SOLN
25.0000 g | Freq: Once | INTRAVENOUS | Status: AC
  Administered 2013-08-08: 25 g via INTRAVENOUS
  Filled 2013-08-08: qty 500

## 2013-08-08 NOTE — Progress Notes (Signed)
ANTIBIOTIC CONSULT NOTE - INITIAL  Pharmacy Consult for vancomcyin Indication: pneumonia  Allergies  Allergen Reactions  . Haldol [Haloperidol Lactate] Palpitations    Sleeps for days    Patient Measurements: Height: 5\' 3"  (160 cm) (estimate given by caregivers) Weight: 133 lb 6.1 oz (60.5 kg) IBW/kg (Calculated) : 56.9 Adjusted Body Weight:   Vital Signs: Temp: 99.9 F (37.7 C) (07/26 0850) Temp src: Axillary (07/26 0850) BP: 87/51 mmHg (07/26 0850) Pulse Rate: 101 (07/26 0850) Intake/Output from previous day: 07/25 0701 - 07/26 0700 In: 3825 [I.V.:2875; Blood:700; IV Piggyback:250] Out: 600 [Urine:600] Intake/Output from this shift: Total I/O In: 628.3 [I.V.:200; Blood:228.3; IV Piggyback:200] Out: -   Labs:  Recent Labs  08/07/13 1735 08/07/13 1901 08/08/13 0530  WBC 18.1* 15.3* 27.7*  HGB 5.3* 3.9* 6.5*  PLT 992* 951* 611*  CREATININE 1.32  --  0.90   Estimated Creatinine Clearance: 71.1 ml/min (by C-G formula based on Cr of 0.9). No results found for this basename: VANCOTROUGH, Corlis Leak, VANCORANDOM, [place], GENTPEAK, GENTRANDOM, TOBRATROUGH, TOBRAPEAK, TOBRARND, AMIKACINPEAK, AMIKACINTROU, AMIKACIN,  in the last 72 hours   Microbiology: Recent Results (from the past 720 hour(s))  MRSA PCR SCREENING     Status: None   Collection Time    08/07/13  9:52 PM      Result Value Ref Range Status   MRSA by PCR NEGATIVE  NEGATIVE Final   Comment:            The GeneXpert MRSA Assay (FDA     approved for NASAL specimens     only), is one component of a     comprehensive MRSA colonization     surveillance program. It is not     intended to diagnose MRSA     infection nor to guide or     monitor treatment for     MRSA infections.    Medical History: Past Medical History  Diagnosis Date  . Hyperlipidemia   . Anxiety   . Mental retardation     ue to temp of 108 aftrer having measles age 68.  . Hx of diaper rash   . Edema of foot 02/06/2012   BILATERAL    Medications:  Scheduled:  . sodium chloride   Intravenous STAT  . ampicillin-sulbactam (UNASYN) IV  3 g Intravenous Q6H  . antiseptic oral rinse  15 mL Mouth Rinse q12n4p  . chlorhexidine  10 mL Mouth/Throat TID  . polyethylene glycol-electrolytes  4,000 mL Oral Once   Infusions:  . pantoprozole (PROTONIX) infusion 8 mg/hr (08/08/13 0100)   Assessment: 58 yo male with pneumonia will be started on vancomycin therapy.  SCr 0.9 (CrCl ~71).  WBC 27.7.  Goal of Therapy:  Vancomycin trough level 15-20 mcg/ml  07/26 Vanc >> 0726 Unasyn >>  07/25 blood cx x2 >>  Plan:  1) Vancomycin 750mg  iv q12h 2) Follow up on culture 3) Monitor renal function closely. Check vancomycin trough when it's appropriate.    Khylin Gutridge, Tsz-Yin 08/08/2013,9:31 AM

## 2013-08-08 NOTE — Progress Notes (Signed)
PROGRESS NOTE  Steven [name] NFA:[id number] DOB: [birth year] DOA: 08/07/2013 PCP: [name], MD  Interim history 58 year old male with a history of mental retardation, hyperlipidemia was taken to urgent care because of poor by mouth intake. Apparently the patient has lost a significant amount of weight over the last few weeks. The patient was transferred to the ED and noted to be hypotensive and have leukocytosis with WBC 18.1. His hemoglobin was 5.3 at the time of presentation. Stool guaiac was positive in the emergency department, and he was provided for history of the patient is on chronic diclofenac for chronic pain. Dr. Benson Norway was consulted and plans for EGD and colonscopy.  The patient was initially given 2 units PRBCs.   Assessment/Plan: Acute blood loss anemia -Secondary to GI bleed -Transfuse 2 additional units PRBC (4 units total) -appreciate GI evaluation--plans noted for EGD/colonscopy -Continue Protonix drip GI bleed -As discussed above Sepsis -secondary to pneumonia in pt with tachycardia, hypotension, elevated WBC -broaden abx coverage pending culture data -check cortisol Pneumonia -Concerned about aspiration -Broaden antibiotic coverage pending culture data -Blood cultures are pending -Urinalysis did not show pyuria -Increase WBC partly due to transfusion Acute kidney injury -Baseline creatinine 0.5-0.8 -Serum creatinine 1.32 at the time of admission -Improving with IV fluids Lower extremity edema  -Venous duplex pending  Thrombocytosis -Secondary to iron deficiency anemia -Await iron studies that were obtained prior to transfusion   Family Communication:   No family present Disposition Plan:  Remain in stepdown       Procedures/Studies: Dg Chest 2 View  08/07/2013   CLINICAL DATA:  Rule out infection  EXAM: CHEST  2 VIEW  COMPARISON:  01/27/2012.  FINDINGS: Low volume film with airspace disease in the right mid lung compatible with  pneumonia. Left lung is clear. Left upper lobe nodular density seen on the previous study is not evident on today's exam. The cardio pericardial silhouette is enlarged. No edema or pleural effusion. Bones are diffusely demineralized.  IMPRESSION: Right midlung airspace disease, suspicious for pneumonia.   Electronically Signed   By: Misty Stanley M.D.   On: 08/07/2013 21:04   Dg Chest Port 1 View  08/08/2013   CLINICAL DATA:  Central line placement.  EXAM: PORTABLE CHEST - 1 VIEW  COMPARISON:  Chest radiograph performed 08/07/2013  FINDINGS: The patient's left subclavian line is seen ending about the cavoatrial junction.  Lung expansion is improved, though evaluation is suboptimal due to patient rotation. Patchy right-sided airspace opacities raise concern for pneumonia, perhaps slightly more confluent than on the prior study. The left lung appears clear. No pleural effusion or pneumothorax is seen.  The cardiomediastinal silhouette is normal in size. No acute osseous abnormalities are identified.  IMPRESSION: 1. Left subclavian line seen ending about the cavoatrial junction. 2. Patchy right-sided airspace opacities raise concern for pneumonia, perhaps slightly more confluent than on the prior study.   Electronically Signed   By: Garald Balding M.D.   On: 08/08/2013 01:49         Subjective: Patient is minimally communicative. He is able to tell me that he is not having any shortness of breath or pain. No reports of respiratory distress, vomiting, diarrhea, hematochezia, melena.   Objective: Filed Vitals:   08/08/13 0700 08/08/13 0728 08/08/13 0800 08/08/13 0850  BP:  89/59 84/57 87/51   Pulse: 99  101 101  Temp: 99.9 F (37.7 C) 99.2 F (37.3 C)  99.9 F (37.7  C)  TempSrc: Axillary Axillary  Axillary  Resp: 31  32 30  Height:      Weight:      SpO2: 93%  94% 95%    Intake/Output Summary (Last 24 hours) at 08/08/13 0905 Last data filed at 08/08/13 0850  Gross per 24 hour  Intake  4153.33 ml  Output    600 ml  Net 3553.33 ml   Weight change:  Exam:   General:  Pt is alert, does not follow commands appropriately, not in acute distress  HEENT: No icterus, No thrush,  [place]/AT  Cardiovascular: RRR, S1/S2, no rubs, no gallops  Respiratory: Bibasilar crackles, right greater than left. No wheezing. Good air movement.  Abdomen: Soft/+BS, non tender, non distended, no guarding  Extremities: 2+LE edema, No lymphangitis, No petechiae, No rashes, no synovitis  Data Reviewed: Basic Metabolic Panel:  Recent Labs Lab 08/07/13 1735 08/08/13 0530  NA 139 144  K 4.3 3.5*  CL 99 107  CO2 25 21  GLUCOSE 130* 111*  BUN 46* 41*  CREATININE 1.32 0.90  CALCIUM 8.4 7.3*   Liver Function Tests:  Recent Labs Lab 08/07/13 1735 08/08/13 0530  AST 18 15  ALT 24 18  ALKPHOS 85 67  BILITOT 0.2* 0.8  PROT 6.1 4.7*  ALBUMIN 2.0* 1.5*   No results found for this basename: LIPASE, AMYLASE,  in the last 168 hours No results found for this basename: AMMONIA,  in the last 168 hours CBC:  Recent Labs Lab 08/07/13 1735 08/07/13 1901 08/08/13 0530  WBC 18.1* 15.3* 27.7*  NEUTROABS 15.8*  --  26.3*  HGB 5.3* 3.9* 6.5*  HCT 17.8* 13.1* 20.7*  MCV 79.1 78.4 85.2  PLT 992* 951* 611*   Cardiac Enzymes: No results found for this basename: CKTOTAL, CKMB, CKMBINDEX, TROPONINI,  in the last 168 hours BNP: No components found with this basename: POCBNP,  CBG:  Recent Labs Lab 08/08/13 0104 08/08/13 0305 08/08/13 0825  GLUCAP 114* 111* 126*    Recent Results (from the past 240 hour(s))  MRSA PCR SCREENING     Status: None   Collection Time    08/07/13  9:52 PM      Result Value Ref Range Status   MRSA by PCR NEGATIVE  NEGATIVE Final   Comment:            The GeneXpert MRSA Assay (FDA     approved for NASAL specimens     only), is one component of a     comprehensive MRSA colonization     surveillance program. It is not     intended to diagnose MRSA      infection nor to guide or     monitor treatment for     MRSA infections.     Scheduled Meds: . sodium chloride   Intravenous STAT  . antiseptic oral rinse  15 mL Mouth Rinse q12n4p  . chlorhexidine  10 mL Mouth/Throat TID  . levofloxacin (LEVAQUIN) IV  750 mg Intravenous Q24H  . metronidazole  500 mg Intravenous 3 times per day  . polyethylene glycol-electrolytes  4,000 mL Oral Once   Continuous Infusions: . sodium chloride    . sodium chloride    . pantoprozole (PROTONIX) infusion 8 mg/hr (08/08/13 0100)     Keishon Chavarin, DO  [facility] Pager [phone]  If 7PM-7AM, please contact night-coverage [email or web address] Password [email or web address] 08/08/2013, 9:05 AM   LOS: 1 day

## 2013-08-08 NOTE — Progress Notes (Addendum)
I called and updated the patient's sister, [name], on phone.  She stated she and her sister are his legal guardians.   Repeat hemoglobin 7.9 his afternoon. The patient's blood pressure remains marginal with systolic in the upper 54O.  Two additionally of PRBCs have been ordered.  500 cc of 5% albumin ordered.  IV fluids will be continued at 100cc/hr. She informed me that the patient lives in [facility] the past 20 years.  As a result of this new information, I will broaden abx coverage to include aspiration coverage as well as pseudomonas. Vancomycin was already started this morning.  The sister reaffirmed the patient's DO NOT RESUSCITATE status. She stated that if his clinical condition continues to worsen, she would want an update. At that time, she may possibly decide to transition him to comfort care. However, she desires continued aggressive care at this time.  DTat  1845--spoke with patient's sisters in room--they are his legal guardians.  Updated them on pt's clinical situation again.  Pt presently receiving prep for colonoscopy via NGT.  Pt's BP now up to low 90s.  Unasyn-->Zosyn.  They agree on DNR status for the patient and continue to want full aggressive care, but expressed that they did not want him to suffer if the patient's clinical situation continues to worsen.  Therefore, they expressed that if there continues to be clinical decline or no improvement, they are open to accept a transition to comfort care.  DTat  Total time 40 min

## 2013-08-08 NOTE — Progress Notes (Addendum)
CRITICAL VALUE ALERT  Critical value received:  Hgb 6.5, K 3.5  Date of notification:  08/08/13  Time of notification:  0630  Critical value read back:Yes.    Nurse who received alert:  Candyce Churn RN  MD notified (1st page):  At[place]  Time of first page:  0630  MD notified (2nd page):  Time of second page:  Responding MD:  Harless Litten NP  Time MD responded:  0539

## 2013-08-08 NOTE — Progress Notes (Signed)
Reported pts hemoglobin of 7.9 to Dr. Carles Collet at 1530.

## 2013-08-08 NOTE — Procedures (Addendum)
Central Venous Catheter Insertion Procedure Note ANCEL EASLER [id number] [birth year]  Procedure: Insertion of Central Venous Catheter Indications: Drug and/or fluid administration and Frequent blood sampling  Procedure Details Consent: Risks of procedure as well as the alternatives and risks of each were explained to the (patient/caregiver).  Consent for procedure obtained.  Time Out: Verified patient identification, verified procedure, site/side was marked, verified correct patient position, special equipment/implants available, medications/allergies/relevent history reviewed, required imaging and test results available.  Performed  Maximum sterile technique was used including antiseptics, cap, gloves, gown, hand hygiene, mask and sheet.  Skin prep: Chlorhexidine; local anesthetic administered  An antimicrobial bonded/coated triple lumen catheter was placed in the left subclavian vein using the Seldinger technique.  Evaluation Blood flow good Complications: No apparent complications Patient did tolerate procedure well. Chest X-ray ordered to verify placement.  CXR: pending.  Laurann Mcmorris, [place]. 08/08/2013, 12:50 AM

## 2013-08-08 NOTE — Consult Note (Signed)
Unassigned Consult for [facility].  Reason for Consult: Anemia and Heme positive stool Referring Physician: [facility]  Otelia Sergeant HPI: This is a 58 year old male with mental retardation admitted with poor appetite and weight loss.  His history is obtained from the chart.  His symptoms have been progressive over the past several weeks and upon admission his HGB was noted to be in the 5 range.  With IV hydration the HGB dropped down to 3.9 g/dL with heme positive stool.  No reports of hematochezia, melena, abdominal pain, nausea, vomiting, or diarrhea.  He is on chronic diclofenac.  The blood work also reveals a thrombocytosis and leukocytosis with a mild left shift.  No iron studies available at this time.  Past Medical History  Diagnosis Date  . Hyperlipidemia   . Anxiety   . Mental retardation     ue to temp of 108 aftrer having measles age 42.  . Hx of diaper rash   . Edema of foot 02/06/2012    BILATERAL    Past Surgical History  Procedure Laterality Date  . Orif ankle fracture  02/06/2012    RIGHT ANKLE  . Orif ankle fracture  02/06/2012    Procedure: OPEN REDUCTION INTERNAL FIXATION (ORIF) ANKLE FRACTURE;  Surgeon: Wylene Simmer, MD;  Location: [place];  Service: Orthopedics;  Laterality: Right;  . Syndesmosis repair  02/06/2012    Procedure: SYNDESMOSIS REPAIR;  Surgeon: Wylene Simmer, MD;  Location: [place];  Service: Orthopedics;  Laterality: Right;  tri maleolar    Family History  Problem Relation Age of Onset  . Diabetes Mellitus II Mother   . Diabetes Mellitus II Father   . Lung cancer Sister   . Lung cancer Brother   . CAD Brother     Social History:  reports that he has been smoking Cigarettes.  He has a 7.5 pack-year smoking history. He has never used smokeless tobacco. He reports that he does not drink alcohol or use illicit drugs.  Allergies:  Allergies  Allergen Reactions  . Haldol [Haloperidol Lactate] Palpitations    Sleeps for days    Medications:   Scheduled: . sodium chloride   Intravenous STAT  . antiseptic oral rinse  15 mL Mouth Rinse q12n4p  . chlorhexidine  10 mL Mouth/Throat TID  . levofloxacin (LEVAQUIN) IV  750 mg Intravenous Q24H  . metronidazole  500 mg Intravenous 3 times per day  . potassium chloride  10 mEq Intravenous Once   Continuous: . sodium chloride    . pantoprozole (PROTONIX) infusion 8 mg/hr (08/08/13 0100)    Results for orders placed during the hospital encounter of 08/07/13 (from the past 24 hour(s))  CBC WITH DIFFERENTIAL     Status: Abnormal   Collection Time    08/07/13  5:35 PM      Result Value Ref Range   WBC 18.1 (*) 4.0 - 10.5 K/uL   RBC 2.25 (*) 4.22 - 5.81 MIL/uL   Hemoglobin 5.3 (*) 13.0 - 17.0 g/dL   HCT 17.8 (*) 39.0 - 52.0 %   MCV 79.1  78.0 - 100.0 fL   MCH 23.6 (*) 26.0 - 34.0 pg   MCHC 29.8 (*) 30.0 - 36.0 g/dL   RDW 15.5  11.5 - 15.5 %   Platelets 992 (*) 150 - 400 K/uL   Neutrophils Relative % 87 (*) 43 - 77 %   Lymphocytes Relative 8 (*) 12 - 46 %   Monocytes Relative 5  3 -  12 %   Eosinophils Relative 0  0 - 5 %   Basophils Relative 0  0 - 1 %   Neutro Abs 15.8 (*) 1.7 - 7.7 K/uL   Lymphs Abs 1.4  0.7 - 4.0 K/uL   Monocytes Absolute 0.9  0.1 - 1.0 K/uL   Eosinophils Absolute 0.0  0.0 - 0.7 K/uL   Basophils Absolute 0.0  0.0 - 0.1 K/uL   RBC Morphology POLYCHROMASIA PRESENT     Smear Review LARGE PLATELETS PRESENT    COMPREHENSIVE METABOLIC PANEL     Status: Abnormal   Collection Time    08/07/13  5:35 PM      Result Value Ref Range   Sodium 139  137 - 147 mEq/L   Potassium 4.3  3.7 - 5.3 mEq/L   Chloride 99  96 - 112 mEq/L   CO2 25  19 - 32 mEq/L   Glucose, Bld 130 (*) 70 - 99 mg/dL   BUN 46 (*) 6 - 23 mg/dL   Creatinine, Ser 1.32  0.50 - 1.35 mg/dL   Calcium 8.4  8.4 - 10.5 mg/dL   Total Protein 6.1  6.0 - 8.3 g/dL   Albumin 2.0 (*) 3.5 - 5.2 g/dL   AST 18  0 - 37 U/L   ALT 24  0 - 53 U/L   Alkaline Phosphatase 85  39 - 117 U/L   Total Bilirubin 0.2 (*)  0.3 - 1.2 mg/dL   GFR calc non Af Amer 57 (*) >90 mL/min   GFR calc Af Amer 67 (*) >90 mL/min   Anion gap 15  5 - 15  URINALYSIS, ROUTINE W REFLEX MICROSCOPIC     Status: None   Collection Time    08/07/13  5:54 PM      Result Value Ref Range   Color, Urine YELLOW  YELLOW   APPearance CLEAR  CLEAR   Specific Gravity, Urine 1.019  1.005 - 1.030   pH 5.0  5.0 - 8.0   Glucose, UA NEGATIVE  NEGATIVE mg/dL   Hgb urine dipstick NEGATIVE  NEGATIVE   Bilirubin Urine NEGATIVE  NEGATIVE   Ketones, ur NEGATIVE  NEGATIVE mg/dL   Protein, ur NEGATIVE  NEGATIVE mg/dL   Urobilinogen, UA 0.2  0.0 - 1.0 mg/dL   Nitrite NEGATIVE  NEGATIVE   Leukocytes, UA NEGATIVE  NEGATIVE  CBC     Status: Abnormal   Collection Time    08/07/13  7:01 PM      Result Value Ref Range   WBC 15.3 (*) 4.0 - 10.5 K/uL   RBC 1.67 (*) 4.22 - 5.81 MIL/uL   Hemoglobin 3.9 (*) 13.0 - 17.0 g/dL   HCT 13.1 (*) 39.0 - 52.0 %   MCV 78.4  78.0 - 100.0 fL   MCH 23.4 (*) 26.0 - 34.0 pg   MCHC 29.8 (*) 30.0 - 36.0 g/dL   RDW 15.5  11.5 - 15.5 %   Platelets 951 (*) 150 - 400 K/uL  TYPE AND SCREEN     Status: None   Collection Time    08/07/13  7:39 PM      Result Value Ref Range   ABO/RH(D) O POS     Antibody Screen NEG     Sample Expiration 08/10/2013     Unit Number [id number]     Blood Component Type RBC CPDA1, LR     Unit division 00     Status of Unit ISSUED  Transfusion Status OK TO TRANSFUSE     Crossmatch Result Compatible     Unit Number [id number]     Blood Component Type RBC CPDA1, LR     Unit division 00     Status of Unit ALLOCATED     Transfusion Status OK TO TRANSFUSE     Crossmatch Result Compatible     Unit Number [id number]     Blood Component Type RBC CPDA1, LR     Unit division 00     Status of Unit ISSUED     Transfusion Status OK TO TRANSFUSE     Crossmatch Result Compatible     Unit Number [id number]     Blood Component Type RBC CPDA1, LR     Unit division 00     Status of  Unit ISSUED     Transfusion Status OK TO TRANSFUSE     Crossmatch Result Compatible    POC OCCULT BLOOD, ED     Status: Abnormal   Collection Time    08/07/13  7:39 PM      Result Value Ref Range   Fecal Occult Bld POSITIVE (*) NEGATIVE  ABO/RH     Status: None   Collection Time    08/07/13  7:39 PM      Result Value Ref Range   ABO/RH(D) O POS    I-STAT CG4 LACTIC ACID, ED     Status: Abnormal   Collection Time    08/07/13  8:10 PM      Result Value Ref Range   Lactic Acid, Venous 3.18 (*) 0.5 - 2.2 mmol/L  PREPARE RBC (CROSSMATCH)     Status: None   Collection Time    08/07/13  9:00 PM      Result Value Ref Range   Order Confirmation ORDER PROCESSED BY BLOOD BANK    MRSA PCR SCREENING     Status: None   Collection Time    08/07/13  9:52 PM      Result Value Ref Range   MRSA by PCR NEGATIVE  NEGATIVE  RETICULOCYTES     Status: Abnormal   Collection Time    08/07/13 11:32 PM      Result Value Ref Range   Retic Ct Pct 3.8 (*) 0.4 - 3.1 %   RBC. 1.71 (*) 4.22 - 5.81 MIL/uL   Retic Count, Manual 65.0  19.0 - 186.0 K/uL  GLUCOSE, CAPILLARY     Status: Abnormal   Collection Time    08/08/13  1:04 AM      Result Value Ref Range   Glucose-Capillary 114 (*) 70 - 99 mg/dL   Comment 1 Notify RN    GLUCOSE, CAPILLARY     Status: Abnormal   Collection Time    08/08/13  3:05 AM      Result Value Ref Range   Glucose-Capillary 111 (*) 70 - 99 mg/dL   Comment 1 Notify RN    COMPREHENSIVE METABOLIC PANEL     Status: Abnormal   Collection Time    08/08/13  5:30 AM      Result Value Ref Range   Sodium 144  137 - 147 mEq/L   Potassium 3.5 (*) 3.7 - 5.3 mEq/L   Chloride 107  96 - 112 mEq/L   CO2 21  19 - 32 mEq/L   Glucose, Bld 111 (*) 70 - 99 mg/dL   BUN 41 (*) 6 - 23 mg/dL   Creatinine, Ser 0.90  0.50 - 1.35 mg/dL  Calcium 7.3 (*) 8.4 - 10.5 mg/dL   Total Protein 4.7 (*) 6.0 - 8.3 g/dL   Albumin 1.5 (*) 3.5 - 5.2 g/dL   AST 15  0 - 37 U/L   ALT 18  0 - 53 U/L   Alkaline  Phosphatase 67  39 - 117 U/L   Total Bilirubin 0.8  0.3 - 1.2 mg/dL   GFR calc non Af Amer >90  >90 mL/min   GFR calc Af Amer >90  >90 mL/min   Anion gap 16 (*) 5 - 15  CBC WITH DIFFERENTIAL     Status: Abnormal   Collection Time    08/08/13  5:30 AM      Result Value Ref Range   WBC 27.7 (*) 4.0 - 10.5 K/uL   RBC 2.43 (*) 4.22 - 5.81 MIL/uL   Hemoglobin 6.5 (*) 13.0 - 17.0 g/dL   HCT 20.7 (*) 39.0 - 52.0 %   MCV 85.2  78.0 - 100.0 fL   MCH 26.7  26.0 - 34.0 pg   MCHC 31.4  30.0 - 36.0 g/dL   RDW 16.8 (*) 11.5 - 15.5 %   Platelets 611 (*) 150 - 400 K/uL   Neutrophils Relative % 95 (*) 43 - 77 %   Lymphocytes Relative 2 (*) 12 - 46 %   Monocytes Relative 3  3 - 12 %   Eosinophils Relative 0  0 - 5 %   Basophils Relative 0  0 - 1 %   Neutro Abs 26.3 (*) 1.7 - 7.7 K/uL   Lymphs Abs 0.6 (*) 0.7 - 4.0 K/uL   Monocytes Absolute 0.8  0.1 - 1.0 K/uL   Eosinophils Absolute 0.0  0.0 - 0.7 K/uL   Basophils Absolute 0.0  0.0 - 0.1 K/uL   RBC Morphology POLYCHROMASIA PRESENT     WBC Morphology INCREASED BANDS (>20% BANDS)       Dg Chest 2 View  08/07/2013   CLINICAL DATA:  Rule out infection  EXAM: CHEST  2 VIEW  COMPARISON:  01/27/2012.  FINDINGS: Low volume film with airspace disease in the right mid lung compatible with pneumonia. Left lung is clear. Left upper lobe nodular density seen on the previous study is not evident on today's exam. The cardio pericardial silhouette is enlarged. No edema or pleural effusion. Bones are diffusely demineralized.  IMPRESSION: Right midlung airspace disease, suspicious for pneumonia.   Electronically Signed   By: Misty Stanley M.D.   On: 08/07/2013 21:04   Dg Chest Port 1 View  08/08/2013   CLINICAL DATA:  Central line placement.  EXAM: PORTABLE CHEST - 1 VIEW  COMPARISON:  Chest radiograph performed 08/07/2013  FINDINGS: The patient's left subclavian line is seen ending about the cavoatrial junction.  Lung expansion is improved, though evaluation is  suboptimal due to patient rotation. Patchy right-sided airspace opacities raise concern for pneumonia, perhaps slightly more confluent than on the prior study. The left lung appears clear. No pleural effusion or pneumothorax is seen.  The cardiomediastinal silhouette is normal in size. No acute osseous abnormalities are identified.  IMPRESSION: 1. Left subclavian line seen ending about the cavoatrial junction. 2. Patchy right-sided airspace opacities raise concern for pneumonia, perhaps slightly more confluent than on the prior study.   Electronically Signed   By: Garald Balding M.D.   On: 08/08/2013 01:49    ROS:  As stated above in the HPI otherwise negative.  Blood pressure 89/59, pulse 99, temperature 99.2 F (37.3  C), temperature source Axillary, resp. rate 31, height 5\' 3"  (1.6 m), weight 133 lb 6.1 oz (60.5 kg), SpO2 93.00%.    PE: Gen: NAD, Alert, contracted position HEENT:  [place]/AT, EOMI Neck: Supple, no LAD Lungs: CTA Bilaterally CV: RRR without M/G/R ABM: Soft, NTND, +BS Ext: No C/C/E  Assessment/Plan: 1) Severe anemia with heme positive stool. 2) Chronic NSAID use. 3) Mental retardation. 4) Weight loss and anorexia. 5) Thrombocytosis. 6) Leukocytosis.   With his profound anemia further evaluation with an EGD/Colonoscopy is required.  His thrombocytosis can be as a result of a severe iron deficiency anemia, but I do not have any iron study results available at this time.  I am presuming the blood was drawn before the blood transfusion.  Interestingly, his MCV is not very low.  Plan: 1) EGD/Colonoscopy tomorrow with Dr. Fuller Plan. 2) NG tube to administer golytely.  Tacuma Graffam D 08/08/2013, 7:45 AM

## 2013-08-08 NOTE — Plan of Care (Signed)
Problem: Phase I Progression Outcomes Goal: Voiding-avoid urinary catheter unless indicated Outcome: Completed/Met Date Met:  08/08/13 Condom cath placed to help with incontinence (this is baseline)

## 2013-08-09 ENCOUNTER — Inpatient Hospital Stay (HOSPITAL_COMMUNITY): Payer: Medicare Other

## 2013-08-09 DIAGNOSIS — E43 Unspecified severe protein-calorie malnutrition: Secondary | ICD-10-CM | POA: Diagnosis present

## 2013-08-09 DIAGNOSIS — R609 Edema, unspecified: Secondary | ICD-10-CM

## 2013-08-09 DIAGNOSIS — J189 Pneumonia, unspecified organism: Secondary | ICD-10-CM

## 2013-08-09 LAB — GLUCOSE, CAPILLARY
Glucose-Capillary: 90 mg/dL (ref 70–99)
Glucose-Capillary: 98 mg/dL (ref 70–99)
Glucose-Capillary: 91 mg/dL (ref 70–99)
Glucose-Capillary: 88 mg/dL (ref 70–99)
Glucose-Capillary: 86 mg/dL (ref 70–99)
Glucose-Capillary: 114 mg/dL — ABNORMAL HIGH (ref 70–99)

## 2013-08-09 LAB — CBC
HEMATOCRIT: 30.9 % — AB (ref 39.0–52.0)
Hemoglobin: 10.3 g/dL — ABNORMAL LOW (ref 13.0–17.0)
MCH: 28.1 pg (ref 26.0–34.0)
MCHC: 33.3 g/dL (ref 30.0–36.0)
MCV: 84.2 fL (ref 78.0–100.0)
PLATELETS: 422 10*3/uL — AB (ref 150–400)
RBC: 3.67 MIL/uL — ABNORMAL LOW (ref 4.22–5.81)
RDW: 15.6 % — AB (ref 11.5–15.5)
WBC: 17.3 10*3/uL — AB (ref 4.0–10.5)

## 2013-08-09 LAB — BASIC METABOLIC PANEL
ANION GAP: 15 (ref 5–15)
BUN: 22 mg/dL (ref 6–23)
CHLORIDE: 111 meq/L (ref 96–112)
CO2: 21 mEq/L (ref 19–32)
Calcium: 7.5 mg/dL — ABNORMAL LOW (ref 8.4–10.5)
Creatinine, Ser: 0.6 mg/dL (ref 0.50–1.35)
Glucose, Bld: 96 mg/dL (ref 70–99)
POTASSIUM: 2.9 meq/L — AB (ref 3.7–5.3)
SODIUM: 147 meq/L (ref 137–147)

## 2013-08-09 LAB — TYPE AND SCREEN
ABO/RH(D): O POS
Antibody Screen: NEGATIVE
UNIT DIVISION: 0
UNIT DIVISION: 0
UNIT DIVISION: 0
UNIT DIVISION: 0
Unit division: 0
Unit division: 0

## 2013-08-09 LAB — PATHOLOGIST SMEAR REVIEW

## 2013-08-09 LAB — MAGNESIUM: Magnesium: 2.2 mg/dL (ref 1.5–2.5)

## 2013-08-09 MED ORDER — PEG 3350-KCL-NABCB-NACL-NASULF 236 G PO SOLR
2000.0000 mL | Freq: Once | ORAL | Status: AC
  Administered 2013-08-09: 2000 mL
  Filled 2013-08-09: qty 4000

## 2013-08-09 MED ORDER — BISACODYL 10 MG RE SUPP
10.0000 mg | Freq: Four times a day (QID) | RECTAL | Status: AC
  Administered 2013-08-09 – 2013-08-10 (×3): 10 mg via RECTAL
  Filled 2013-08-09 (×3): qty 1

## 2013-08-09 MED ORDER — PEG 3350-KCL-NA BICARB-NACL 420 G PO SOLR
4000.0000 mL | Freq: Once | ORAL | Status: DC
  Filled 2013-08-09: qty 4000

## 2013-08-09 MED ORDER — PANTOPRAZOLE SODIUM 40 MG IV SOLR
40.0000 mg | Freq: Two times a day (BID) | INTRAVENOUS | Status: DC
  Administered 2013-08-09 – 2013-08-11 (×5): 40 mg via INTRAVENOUS
  Filled 2013-08-09 (×7): qty 40

## 2013-08-09 MED ORDER — POTASSIUM CHLORIDE 10 MEQ/100ML IV SOLN: 10.0000 meq | INTRAVENOUS | Status: DC

## 2013-08-09 MED ORDER — ALBUTEROL SULFATE (2.5 MG/3ML) 0.083% IN NEBU
2.5000 mg | INHALATION_SOLUTION | RESPIRATORY_TRACT | Status: DC | PRN
  Administered 2013-08-10: 2.5 mg via RESPIRATORY_TRACT

## 2013-08-09 MED ORDER — BISACODYL 5 MG PO TBEC
5.0000 mg | DELAYED_RELEASE_TABLET | Freq: Four times a day (QID) | ORAL | Status: DC
  Administered 2013-08-09 (×2): 5 mg via ORAL
  Filled 2013-08-09 (×2): qty 1

## 2013-08-09 MED ORDER — POTASSIUM CHLORIDE 10 MEQ/50ML IV SOLN
10.0000 meq | INTRAVENOUS | Status: AC
  Administered 2013-08-09 (×3): 10 meq via INTRAVENOUS
  Filled 2013-08-09: qty 50

## 2013-08-09 MED ORDER — POTASSIUM CHLORIDE 10 MEQ/50ML IV SOLN
10.0000 meq | INTRAVENOUS | Status: AC
  Administered 2013-08-09 (×3): 10 meq via INTRAVENOUS
  Filled 2013-08-09 (×3): qty 50

## 2013-08-09 MED ORDER — PEG 3350-KCL-NABCB-NACL-NASULF 236 G PO SOLR
2000.0000 mL | Freq: Once | ORAL | Status: AC
  Administered 2013-08-10: 2000 mL
  Filled 2013-08-09: qty 4000

## 2013-08-09 MED ORDER — DEXTROSE-NACL 5-0.45 % IV SOLN
INTRAVENOUS | Status: DC
  Administered 2013-08-09 – 2013-08-10 (×2): via INTRAVENOUS

## 2013-08-09 NOTE — Progress Notes (Signed)
Formed dark brown/black stool noted. Gerre Couch, PA, notified of stool characteristics. Orders received to notify endoscopy and ask that the procedure is rescheduled until tomorrow. Benjamine Mola, RN, in endoscopy notified of orders received by Gerre Couch, PA, and will reschedule pt for tomorrow. Family at bedside and updated with new plan of care. 08/09/2013 10:57 AM Steven [name]

## 2013-08-09 NOTE — Progress Notes (Signed)
Passing solid stool.  I am postponing case until tomorrow.  Will need another bowel prep, split dose this time Started IVF at 100 per hour as NGT in place and he is [place] T. Fuller Plan MD Marval Regal

## 2013-08-09 NOTE — Progress Notes (Signed)
VASCULAR LAB PRELIMINARY  PRELIMINARY  PRELIMINARY  PRELIMINARY  Bilateral lower extremity venous Dopplers completed.    Preliminary report:  There is no obvious evidence of DVT noted in the visualized veins of the bilateral lower extremities.   Teosha Casso, RVT 08/09/2013, 3:58 PM

## 2013-08-09 NOTE — Progress Notes (Addendum)
INITIAL NUTRITION ASSESSMENT  DOCUMENTATION CODES Per approved criteria  -Severe malnutrition in the context of chronic illness   INTERVENTION:  Diet advancement as tolerated per MD, recommend regular/soft diet with Ensure Complete PO BID.  NUTRITION DIAGNOSIS: Malnutrition related to inadequate oral intake as evidenced by severe depletion of muscle and subcutaneous fat mass with reported 19% weight loss over the past 6 months.   Goal: Intake to meet >90% of estimated nutrition needs.  Monitor:  TF tolerance/adequacy, weight trend, labs, vent status.  Reason for Assessment: MST  58 y.o. male  Admitting Dx: Poor Appetite  ASSESSMENT: 58 y.o. male with history of mental retardation and hyperlipidemia was urgently brought to the urgent care Center because patient was having poor appetite and not eating well. Per the patient's caregiver and patient's sister patient has been losing weight over the last few weeks and has been hardly eating anything. Chest x-ray shows possible pneumonia.  Plans for endoscopy tomorrow.  Caregiver in patient's room reports that patient usually eats really well up until a few weeks ago. She is unsure of his usual weight, but guesses ~165 lb.  Pt meets criteria for severe MALNUTRITION in the context of chronic illness as evidenced by severe depletion of muscle and subcutaneous fat mass with reported 19% weight loss over the past 6 months.  Nutrition Focused Physical Exam:  Subcutaneous Fat:  Orbital Region: severe depletion Upper Arm Region: WNL Thoracic and Lumbar Region: NA  Muscle:  Temple Region: severe depletion Clavicle Bone Region: severe depletion Clavicle and Acromion Bone Region: severe depletion Scapular Bone Region: NA Dorsal Hand: WNL Patellar Region: WNL Anterior Thigh Region: WNL Posterior Calf Region: WNL  Edema: none  Height: Ht Readings from Last 1 Encounters:  08/07/13 5\' 3"  (1.6 m)    Weight: Wt Readings from Last 1  Encounters:  08/07/13 133 lb 6.1 oz (60.5 kg)    Ideal Body Weight: 56.4 kg  % Ideal Body Weight: 107%  Wt Readings from Last 10 Encounters:  08/07/13 133 lb 6.1 oz (60.5 kg)  02/06/12 171 lb 1.2 oz (77.6 kg)  02/06/12 171 lb 1.2 oz (77.6 kg)  01/27/12 185 lb (83.915 kg)    Usual Body Weight: 171 lb (1.5 years ago)  % Usual Body Weight: 78%  BMI:  Body mass index is 23.63 kg/(m^2).  Estimated Nutritional Needs: Kcal: 1600-1800 Protein: 80-95 gm Fluid: 1.6-1.8 L  Skin: stage 2 pressure ulcer to left foot  Diet Order: NPO  EDUCATION NEEDS: -Education not appropriate at this time   Intake/Output Summary (Last 24 hours) at 08/09/13 1449 Last data filed at 08/09/13 1235  Gross per 24 hour  Intake 5709.17 ml  Output    900 ml  Net 4809.17 ml    Last BM: 7/27   Labs:   Recent Labs Lab 08/07/13 1735 08/08/13 0530 08/09/13 0320 08/09/13 0418  NA 139 144 147  --   K 4.3 3.5* 2.9*  --   CL 99 107 111  --   CO2 25 21 21   --   BUN 46* 41* 22  --   CREATININE 1.32 0.90 0.60  --   CALCIUM 8.4 7.3* 7.5*  --   MG  --   --   --  2.2  GLUCOSE 130* 111* 96  --     CBG (last 3)   Recent Labs  08/09/13 0332 08/09/13 0759 08/09/13 1134  GLUCAP 98 91 88    Scheduled Meds: . antiseptic oral rinse  15  mL Mouth Rinse q12n4p  . bisacodyl  5 mg Oral Q6H  . chlorhexidine  10 mL Mouth/Throat TID  . pantoprazole (PROTONIX) IV  40 mg Intravenous Q12H  . piperacillin-tazobactam (ZOSYN)  IV  3.375 g Intravenous Q8H  . polyethylene glycol-electrolytes  4,000 mL Oral Once  . vancomycin  750 mg Intravenous Q12H    Continuous Infusions: . dextrose 5 % and 0.45% NaCl 100 mL/hr at 08/09/13 1221    Past Medical History  Diagnosis Date  . Hyperlipidemia   . Anxiety   . Mental retardation     ue to temp of 108 aftrer having measles age 39.  . Hx of diaper rash   . Edema of foot 02/06/2012    BILATERAL    Past Surgical History  Procedure Laterality Date  . Orif  ankle fracture  02/06/2012    RIGHT ANKLE  . Orif ankle fracture  02/06/2012    Procedure: OPEN REDUCTION INTERNAL FIXATION (ORIF) ANKLE FRACTURE;  Surgeon: Wylene Simmer, MD;  Location: [place];  Service: Orthopedics;  Laterality: Right;  . Syndesmosis repair  02/06/2012    Procedure: SYNDESMOSIS REPAIR;  Surgeon: Wylene Simmer, MD;  Location: [place];  Service: Orthopedics;  Laterality: Right;  tri maleolar    Molli Barrows, RD, LDN, [place] Pager [phone] After Hours Pager [phone]

## 2013-08-09 NOTE — Progress Notes (Signed)
PROGRESS NOTE  Steven [name] GDJ:[id number] DOB: 1955-03-22 DOA: 08/07/2013 PCP: [name], MD  Interim history  58 year old male with a history of mental retardation, hyperlipidemia was taken to urgent care because of poor by mouth intake. Apparently the patient has lost a significant amount of weight over the last few weeks. The patient was transferred to the ED and noted to be hypotensive and have leukocytosis with WBC 18.1. His hemoglobin was 5.3 at the time of presentation. With intravenous fluids, his hemoglobin dropped to 3.9. Stool guaiac was positive in the emergency department, and he was provided for history of the patient is on chronic diclofenac for chronic pain. Dr. Benson Norway was consulted and plans for EGD and colonscopy. The patient has received 6 units PRBCs total. The patient was started on intravenous antibiotics for sepsis secondary to healthcare associated pneumonia and possible aspiration. Multiple discussions were held with the family which have confirmed his DO NOT RESUSCITATE status. Assessment/Plan:  Acute blood loss anemia  -Secondary to GI bleed  -received 6 units PRBCs total -Hgb stable presently -appreciate GI evaluation--plans noted for EGD/colonscopy  -Continue Protonix drip  GI bleed  -As discussed above  Sepsis  -secondary to pneumonia in pt with tachycardia, hypotension, elevated WBC  -broaden abx coverage pending culture data  -procalcitionin--6.78 -check cortisol--pending Pneumonia  -Concerned about aspiration  -Broaden antibiotic coverage pending culture data  -Blood cultures are pending  -Urinalysis did not show pyuria  -Increase WBC partly due to transfusion  Acute kidney injury  -Baseline creatinine 0.5-0.8  -Serum creatinine 1.32 at the time of admission  -Improving with IV fluids  Lower extremity edema  -Venous duplex pending  Thrombocytosis  -Secondary to iron deficiency anemia  -Await iron studies that were obtained prior  to transfusion  Family Communication: No family present  Disposition Plan: Remain in stepdown      Procedures/Studies: Dg Chest 2 View  08/07/2013   CLINICAL DATA:  Rule out infection  EXAM: CHEST  2 VIEW  COMPARISON:  01/27/2012.  FINDINGS: Low volume film with airspace disease in the right mid lung compatible with pneumonia. Left lung is clear. Left upper lobe nodular density seen on the previous study is not evident on today's exam. The cardio pericardial silhouette is enlarged. No edema or pleural effusion. Bones are diffusely demineralized.  IMPRESSION: Right midlung airspace disease, suspicious for pneumonia.   Electronically Signed   By: Misty Stanley M.D.   On: 08/07/2013 21:04   Dg Chest Port 1 View  08/09/2013   CLINICAL DATA:  Decreased O2 saturation.  EXAM: PORTABLE CHEST - 1 VIEW 01:03 a.m.  COMPARISON:  08/08/2013 and 08/07/2013  FINDINGS: The patient has developed and increased infiltrate at the right lung base with new atelectasis and an infiltrate in the left perihilar region as well as new small right pleural effusion. I suspect the findings represent pulmonary edema.  NG tube tip is below the diaphragm. Central venous catheter tip is at the cavoatrial junction.  IMPRESSION: Progressive infiltrate at the right base with new small right effusion.  New atelectasis in faint infiltrate in the left. I suspect the findings represent pulmonary edema.   Electronically Signed   By: [name] M.D.   On: 08/09/2013 01:26   Dg Chest Port 1 View  08/08/2013   CLINICAL DATA:  Central line placement.  EXAM: PORTABLE CHEST - 1 VIEW  COMPARISON:  Chest radiograph performed 08/07/2013  FINDINGS: The patient's left  subclavian line is seen ending about the cavoatrial junction.  Lung expansion is improved, though evaluation is suboptimal due to patient rotation. Patchy right-sided airspace opacities raise concern for pneumonia, perhaps slightly more confluent than on the prior study. The left lung  appears clear. No pleural effusion or pneumothorax is seen.  The cardiomediastinal silhouette is normal in size. No acute osseous abnormalities are identified.  IMPRESSION: 1. Left subclavian line seen ending about the cavoatrial junction. 2. Patchy right-sided airspace opacities raise concern for pneumonia, perhaps slightly more confluent than on the prior study.   Electronically Signed   By: Garald Balding M.D.   On: 08/08/2013 01:49   Dg Abd Portable 1v  08/08/2013   CLINICAL DATA:  NG tube placement.  EXAM: PORTABLE ABDOMEN - 1 VIEW  COMPARISON:  None.  FINDINGS: An NG tube is identified with tip overlying the proximal-mid stomach.  The bowel gas pattern is unremarkable.  IMPRESSION: NG tube with tip overlying the proximal -mid stomach.   Electronically Signed   By: [name] M.D.   On: 08/08/2013 15:55         Subjective: The patient is nonverbal. No overnight events. No reports of vomiting, respiratory distress. The patient had multiple stools from his bowel prep.  Objective: Filed Vitals:   08/09/13 0445 08/09/13 0455 08/09/13 0630 08/09/13 0810  BP:    104/54  Pulse:    62  Temp:    97.8 F (36.6 C)  TempSrc:    Axillary  Resp: 24 17 18 24   Height:      Weight:      SpO2: 97% 97% 99% 97%    Intake/Output Summary (Last 24 hours) at 08/09/13 1018 Last data filed at 08/09/13 0745  Gross per 24 hour  Intake 5863.75 ml  Output    900 ml  Net 4963.75 ml   Weight change:  Exam:   General:  Pt is alert, does not follow commands appropriately, not in acute distress  HEENT: No icterus, No thrush,  [place]/AT  Cardiovascular: RRR, S1/S2, no rubs, no gallops  Respiratory: Bilateral crackles, right greater than left. No wheezing.  Abdomen: Soft/+BS, non tender, non distended, no guarding  Extremities: 2+LE edema, No lymphangitis, No petechiae, No rashes, no synovitis  Data Reviewed: Basic Metabolic Panel:  Recent Labs Lab 08/07/13 1735 08/08/13 0530 08/09/13 0320  08/09/13 0418  NA 139 144 147  --   K 4.3 3.5* 2.9*  --   CL 99 107 111  --   CO2 25 21 21   --   GLUCOSE 130* 111* 96  --   BUN 46* 41* 22  --   CREATININE 1.32 0.90 0.60  --   CALCIUM 8.4 7.3* 7.5*  --   MG  --   --   --  2.2   Liver Function Tests:  Recent Labs Lab 08/07/13 1735 08/08/13 0530  AST 18 15  ALT 24 18  ALKPHOS 85 67  BILITOT 0.2* 0.8  PROT 6.1 4.7*  ALBUMIN 2.0* 1.5*   No results found for this basename: LIPASE, AMYLASE,  in the last 168 hours No results found for this basename: AMMONIA,  in the last 168 hours CBC:  Recent Labs Lab 08/07/13 1735 08/07/13 1901 08/08/13 0530 08/08/13 1230 08/09/13 0320  WBC 18.1* 15.3* 27.7* 20.9* 17.3*  NEUTROABS 15.8*  --  26.3* 19.0*  --   HGB 5.3* 3.9* 6.5* 7.9* 10.3*  HCT 17.8* 13.1* 20.7* 23.9* 30.9*  MCV 79.1 78.4  85.2 83.6 84.2  PLT 992* 951* 611* 498* 422*   Cardiac Enzymes: No results found for this basename: CKTOTAL, CKMB, CKMBINDEX, TROPONINI,  in the last 168 hours BNP: No components found with this basename: POCBNP,  CBG:  Recent Labs Lab 08/08/13 1532 08/08/13 2014 08/08/13 2300 08/09/13 0332 08/09/13 0759  GLUCAP 116* 113* 90 98 91    Recent Results (from the past 240 hour(s))  CULTURE, BLOOD (ROUTINE X 2)     Status: None   Collection Time    08/07/13  7:37 PM      Result Value Ref Range Status   Specimen Description BLOOD LEFT ARM   Final   Special Requests BOTTLES DRAWN AEROBIC AND ANAEROBIC 6ML   Final   Culture  Setup Time     Final   Value: 08/08/2013 14:49     Performed at [facility]   Culture     Final   Value:        BLOOD CULTURE RECEIVED NO GROWTH TO DATE CULTURE WILL BE HELD FOR 5 DAYS BEFORE ISSUING A FINAL NEGATIVE REPORT     Performed at [facility]   Report Status PENDING   Incomplete  CULTURE, BLOOD (ROUTINE X 2)     Status: None   Collection Time    08/07/13  9:45 PM      Result Value Ref Range Status   Specimen Description BLOOD LEFT HAND    Final   Special Requests BOTTLES DRAWN AEROBIC AND ANAEROBIC 5 CC   Final   Culture  Setup Time     Final   Value: 08/08/2013 14:49     Performed at [facility]   Culture     Final   Value:        BLOOD CULTURE RECEIVED NO GROWTH TO DATE CULTURE WILL BE HELD FOR 5 DAYS BEFORE ISSUING A FINAL NEGATIVE REPORT     Performed at [facility]   Report Status PENDING   Incomplete  MRSA PCR SCREENING     Status: None   Collection Time    08/07/13  9:52 PM      Result Value Ref Range Status   MRSA by PCR NEGATIVE  NEGATIVE Final   Comment:            The GeneXpert MRSA Assay (FDA     approved for NASAL specimens     only), is one component of a     comprehensive MRSA colonization     surveillance program. It is not     intended to diagnose MRSA     infection nor to guide or     monitor treatment for     MRSA infections.     Scheduled Meds: . antiseptic oral rinse  15 mL Mouth Rinse q12n4p  . chlorhexidine  10 mL Mouth/Throat TID  . piperacillin-tazobactam (ZOSYN)  IV  3.375 g Intravenous Q8H  . vancomycin  750 mg Intravenous Q12H   Continuous Infusions: . pantoprozole (PROTONIX) infusion 8 mg/hr (08/09/13 1001)     Vincy Feliz, DO  [facility] Pager [phone]  If 7PM-7AM, please contact night-coverage [email or web address] Password [email or web address] 08/09/2013, 10:18 AM   LOS: 2 days

## 2013-08-10 ENCOUNTER — Inpatient Hospital Stay (HOSPITAL_COMMUNITY): Payer: Medicare Other

## 2013-08-10 DIAGNOSIS — J9600 Acute respiratory failure, unspecified whether with hypoxia or hypercapnia: Secondary | ICD-10-CM

## 2013-08-10 DIAGNOSIS — D509 Iron deficiency anemia, unspecified: Secondary | ICD-10-CM | POA: Diagnosis present

## 2013-08-10 DIAGNOSIS — R195 Other fecal abnormalities: Secondary | ICD-10-CM

## 2013-08-10 LAB — BASIC METABOLIC PANEL
Anion gap: 12 (ref 5–15)
BUN: 12 mg/dL (ref 6–23)
CALCIUM: 7.7 mg/dL — AB (ref 8.4–10.5)
CO2: 20 mEq/L (ref 19–32)
CREATININE: 0.57 mg/dL (ref 0.50–1.35)
Chloride: 114 mEq/L — ABNORMAL HIGH (ref 96–112)
Glucose, Bld: 113 mg/dL — ABNORMAL HIGH (ref 70–99)
POTASSIUM: 3 meq/L — AB (ref 3.7–5.3)
Sodium: 146 mEq/L (ref 137–147)

## 2013-08-10 LAB — PRO B NATRIURETIC PEPTIDE: Pro B Natriuretic peptide (BNP): 1887 pg/mL — ABNORMAL HIGH (ref 0–125)

## 2013-08-10 LAB — GLUCOSE, CAPILLARY
GLUCOSE-CAPILLARY: 83 mg/dL (ref 70–99)
Glucose-Capillary: 102 mg/dL — ABNORMAL HIGH (ref 70–99)
Glucose-Capillary: 86 mg/dL (ref 70–99)
Glucose-Capillary: 109 mg/dL — ABNORMAL HIGH (ref 70–99)
Glucose-Capillary: 93 mg/dL (ref 70–99)

## 2013-08-10 LAB — CBC
HCT: 33.8 % — ABNORMAL LOW (ref 39.0–52.0)
Hemoglobin: 10.9 g/dL — ABNORMAL LOW (ref 13.0–17.0)
MCH: 26.9 pg (ref 26.0–34.0)
MCHC: 32.2 g/dL (ref 30.0–36.0)
MCV: 83.5 fL (ref 78.0–100.0)
Platelets: 482 10*3/uL — ABNORMAL HIGH (ref 150–400)
RBC: 4.05 MIL/uL — ABNORMAL LOW (ref 4.22–5.81)
RDW: 16.7 % — ABNORMAL HIGH (ref 11.5–15.5)
WBC: 17.9 10*3/uL — ABNORMAL HIGH (ref 4.0–10.5)

## 2013-08-10 LAB — CORTISOL-AM, BLOOD: Cortisol - AM: 23.5 ug/dL — ABNORMAL HIGH (ref 4.3–22.4)

## 2013-08-10 LAB — PROCALCITONIN: Procalcitonin: 2.47 ng/mL

## 2013-08-10 MED ORDER — POTASSIUM CHLORIDE 10 MEQ/50ML IV SOLN
10.0000 meq | INTRAVENOUS | Status: AC
  Administered 2013-08-10 (×5): 10 meq via INTRAVENOUS
  Filled 2013-08-10: qty 50

## 2013-08-10 MED ORDER — PEG-KCL-NACL-NASULF-NA ASC-C 100 G PO SOLR
1.0000 | Freq: Once | ORAL | Status: AC
  Administered 2013-08-10: 200 g via ORAL
  Filled 2013-08-10: qty 1

## 2013-08-10 MED ORDER — FUROSEMIDE 10 MG/ML IJ SOLN
20.0000 mg | Freq: Once | INTRAMUSCULAR | Status: AC
  Administered 2013-08-10: 20 mg via INTRAVENOUS
  Filled 2013-08-10: qty 2

## 2013-08-10 NOTE — Progress Notes (Signed)
Spoke with RN.  Pt still passing small pieces formed stool  Assessment/plan: *  Severe constipation: Give entire movi prep over 3 to 4 hours today.   *  GI bleed/anemia.  S/p transfusions. hgb 10.9  *  Weight loss, anorexia:  Plan Colon egd when bowel is clear. It is again rescheduled for tomorrow afternoon.   Steven [name] [facility]   Continue bowel prep per NGT until clear. Reschedule Colonoscopy and EGD for tomorrow with Dr. Carlean Purl for heme + stool, Fe deficiency anemia, wt loss, constipation and anorexia.    Pricilla Riffle. Fuller Plan MD

## 2013-08-10 NOTE — Progress Notes (Addendum)
PROGRESS NOTE  Steven [name] DJS:[id number] DOB: [birth year] DOA: 08/07/2013 PCP: [name], MD   Interim history  58 year old male with a history of mental retardation, hyperlipidemia was taken to urgent care because of poor by mouth intake. Apparently the patient has lost a significant amount of weight over the last few weeks. The patient was transferred to the ED and noted to be hypotensive and have leukocytosis with WBC 18.1. His hemoglobin was 5.3 at the time of presentation. With intravenous fluids, his hemoglobin dropped to 3.9. Stool guaiac was positive in the emergency department, and he was provided for history of the patient is on chronic diclofenac for chronic pain. Dr. Benson Norway was consulted and plans for EGD and colonscopy. The patient has received 6 units PRBCs total. The patient was started on intravenous antibiotics for sepsis secondary to healthcare associated pneumonia and possible aspiration. Multiple discussions were held with the family which have confirmed his DO NOT RESUSCITATE status.  Assessment/Plan:  Acute blood loss anemia  -Secondary to GI bleed  -received 6 units PRBCs total --now hgb is stable -Hgb stable presently  -appreciate GI evaluation--plans noted for EGD/colonscopy  -Continue Protonix drip  GI bleed  -As discussed above  -continue prep for colonscopy--pt still intermittenly having solid stool Sepsis  -SBP now stablized in upper 90s, low 100s -secondary to pneumonia in pt with tachycardia, hypotension, elevated WBC  -Continue Zosyn, d/c vancomycin -procalcitionin--6.78-->2.47 -check cortisol--23.5 Pneumonia  -Concerned about aspiration  -Broaden antibiotic coverage pending culture data  -Blood cultures are pending  -Urinalysis did not show pyuria  -Increase WBC partly due to transfusion  Acute Hypoxemic Respiratory Failure -CXR -proBNP--1887 -give dose of lasix IV as pt is 15L positive Acute kidney injury  -Baseline creatinine  0.5-0.8  -Serum creatinine 1.32 at the time of admission  -Improved with IVF Lower extremity edema  -Venous duplex--neg  Thrombocytosis  -Secondary to iron deficiency anemia  -iron sat <10% -start iron when able to take po Family Communication: sisters updated at bedside Disposition Plan: Remain in stepdown  Antibiotics:  Vancomycin 08/08/13>>>08/10/13  Zosyn 08/08/13>>>      Procedures/Studies: Dg Chest 2 View  08/07/2013   CLINICAL DATA:  Rule out infection  EXAM: CHEST  2 VIEW  COMPARISON:  01/27/2012.  FINDINGS: Low volume film with airspace disease in the right mid lung compatible with pneumonia. Left lung is clear. Left upper lobe nodular density seen on the previous study is not evident on today's exam. The cardio pericardial silhouette is enlarged. No edema or pleural effusion. Bones are diffusely demineralized.  IMPRESSION: Right midlung airspace disease, suspicious for pneumonia.   Electronically Signed   By: Misty Stanley M.D.   On: 08/07/2013 21:04   Dg Chest Port 1 View  08/09/2013   CLINICAL DATA:  Decreased O2 saturation.  EXAM: PORTABLE CHEST - 1 VIEW 01:03 a.m.  COMPARISON:  08/08/2013 and 08/07/2013  FINDINGS: The patient has developed and increased infiltrate at the right lung base with new atelectasis and an infiltrate in the left perihilar region as well as new small right pleural effusion. I suspect the findings represent pulmonary edema.  NG tube tip is below the diaphragm. Central venous catheter tip is at the cavoatrial junction.  IMPRESSION: Progressive infiltrate at the right base with new small right effusion.  New atelectasis in faint infiltrate in the left. I suspect the findings represent pulmonary edema.   Electronically Signed   By: [name]  M.D.   On: 08/09/2013 01:26   Dg Chest Port 1 View  08/08/2013   CLINICAL DATA:  Central line placement.  EXAM: PORTABLE CHEST - 1 VIEW  COMPARISON:  Chest radiograph performed 08/07/2013  FINDINGS: The patient's  left subclavian line is seen ending about the cavoatrial junction.  Lung expansion is improved, though evaluation is suboptimal due to patient rotation. Patchy right-sided airspace opacities raise concern for pneumonia, perhaps slightly more confluent than on the prior study. The left lung appears clear. No pleural effusion or pneumothorax is seen.  The cardiomediastinal silhouette is normal in size. No acute osseous abnormalities are identified.  IMPRESSION: 1. Left subclavian line seen ending about the cavoatrial junction. 2. Patchy right-sided airspace opacities raise concern for pneumonia, perhaps slightly more confluent than on the prior study.   Electronically Signed   By: Garald Balding M.D.   On: 08/08/2013 01:49   Dg Abd Portable 1v  08/08/2013   CLINICAL DATA:  NG tube placement.  EXAM: PORTABLE ABDOMEN - 1 VIEW  COMPARISON:  None.  FINDINGS: An NG tube is identified with tip overlying the proximal-mid stomach.  The bowel gas pattern is unremarkable.  IMPRESSION: NG tube with tip overlying the proximal -mid stomach.   Electronically Signed   By: [name] M.D.   On: 08/08/2013 15:55         Subjective: Pt is more alert.  Still having occasional solid stool without hematochezia or melena.  +SOB without respiratory distress.  No vomiting.  Follows command.  Limited ROS due to mental retardation  Objective: Filed Vitals:   08/10/13 0355 08/10/13 0406 08/10/13 0739 08/10/13 1122  BP:   106/53 102/51  Pulse:   72 71  Temp:   97.2 F (36.2 C) 97.5 F (36.4 C)  TempSrc:   Oral Axillary  Resp: 46  31 31  Height:      Weight:      SpO2: 82% 92% 98% 93%    Intake/Output Summary (Last 24 hours) at 08/10/13 1212 Last data filed at 08/10/13 0500  Gross per 24 hour  Intake 5744.58 ml  Output      0 ml  Net 5744.58 ml   Weight change:  Exam:   General:  Pt is alert, does not follow commands appropriately, not in acute distress  HEENT: No icterus, No thrush,   [place]/AT  Cardiovascular: RRR, S1/S2, no rubs, no gallops  Respiratory: bilateral scattered crackles without wheeze  Abdomen: Soft/+BS, non tender, non distended, no guarding  Extremities: 2+LE edema, No lymphangitis, No petechiae, No rashes, no synovitis  Data Reviewed: Basic Metabolic Panel:  Recent Labs Lab 08/07/13 1735 08/08/13 0530 08/09/13 0320 08/09/13 0418 08/10/13 0415  NA 139 144 147  --  146  K 4.3 3.5* 2.9*  --  3.0*  CL 99 107 111  --  114*  CO2 $Re'25 21 21  'lVU$ --  20  GLUCOSE 130* 111* 96  --  113*  BUN 46* 41* 22  --  12  CREATININE 1.32 0.90 0.60  --  0.57  CALCIUM 8.4 7.3* 7.5*  --  7.7*  MG  --   --   --  2.2  --    Liver Function Tests:  Recent Labs Lab 08/07/13 1735 08/08/13 0530  AST 18 15  ALT 24 18  ALKPHOS 85 67  BILITOT 0.2* 0.8  PROT 6.1 4.7*  ALBUMIN 2.0* 1.5*   No results found for this basename: LIPASE, AMYLASE,  in the  last 168 hours No results found for this basename: AMMONIA,  in the last 168 hours CBC:  Recent Labs Lab 08/07/13 1735 08/07/13 1901 08/08/13 0530 08/08/13 1230 08/09/13 0320 08/10/13 0415  WBC 18.1* 15.3* 27.7* 20.9* 17.3* 17.9*  NEUTROABS 15.8*  --  26.3* 19.0*  --   --   HGB 5.3* 3.9* 6.5* 7.9* 10.3* 10.9*  HCT 17.8* 13.1* 20.7* 23.9* 30.9* 33.8*  MCV 79.1 78.4 85.2 83.6 84.2 83.5  PLT 992* 951* 611* 498* 422* 482*   Cardiac Enzymes: No results found for this basename: CKTOTAL, CKMB, CKMBINDEX, TROPONINI,  in the last 168 hours BNP: No components found with this basename: POCBNP,  CBG:  Recent Labs Lab 08/09/13 1134 08/09/13 1611 08/09/13 1933 08/10/13 0340 08/10/13 0737  GLUCAP 88 86 114* 102* 86    Recent Results (from the past 240 hour(s))  CULTURE, BLOOD (ROUTINE X 2)     Status: None   Collection Time    08/07/13  7:37 PM      Result Value Ref Range Status   Specimen Description BLOOD LEFT ARM   Final   Special Requests BOTTLES DRAWN AEROBIC AND ANAEROBIC 6ML   Final   Culture  Setup Time      Final   Value: 08/08/2013 14:49     Performed at [facility]   Culture     Final   Value:        BLOOD CULTURE RECEIVED NO GROWTH TO DATE CULTURE WILL BE HELD FOR 5 DAYS BEFORE ISSUING A FINAL NEGATIVE REPORT     Performed at [facility]   Report Status PENDING   Incomplete  CULTURE, BLOOD (ROUTINE X 2)     Status: None   Collection Time    08/07/13  9:45 PM      Result Value Ref Range Status   Specimen Description BLOOD LEFT HAND   Final   Special Requests BOTTLES DRAWN AEROBIC AND ANAEROBIC 5 CC   Final   Culture  Setup Time     Final   Value: 08/08/2013 14:49     Performed at [facility]   Culture     Final   Value:        BLOOD CULTURE RECEIVED NO GROWTH TO DATE CULTURE WILL BE HELD FOR 5 DAYS BEFORE ISSUING A FINAL NEGATIVE REPORT     Performed at [facility]   Report Status PENDING   Incomplete  MRSA PCR SCREENING     Status: None   Collection Time    08/07/13  9:52 PM      Result Value Ref Range Status   MRSA by PCR NEGATIVE  NEGATIVE Final   Comment:            The GeneXpert MRSA Assay (FDA     approved for NASAL specimens     only), is one component of a     comprehensive MRSA colonization     surveillance program. It is not     intended to diagnose MRSA     infection nor to guide or     monitor treatment for     MRSA infections.     Scheduled Meds: . antiseptic oral rinse  15 mL Mouth Rinse q12n4p  . chlorhexidine  10 mL Mouth/Throat TID  . pantoprazole (PROTONIX) IV  40 mg Intravenous Q12H  . peg 3350 powder  1 kit Oral Once  . piperacillin-tazobactam (ZOSYN)  IV  3.375 g Intravenous Q8H  .  potassium chloride  10 mEq Intravenous Q1 Hr x 5  . vancomycin  750 mg Intravenous Q12H   Continuous Infusions:    Mykeal Carrick, DO  [facility] Pager [phone]  If 7PM-7AM, please contact night-coverage [email or web address] Password [email or web address] 08/10/2013, 12:12 PM   LOS: 3 days

## 2013-08-11 ENCOUNTER — Encounter (HOSPITAL_COMMUNITY)
Admission: EM | Disposition: A | Discharge status: Home Health | Payer: Self-pay | Source: Home / Self Care | Attending: Internal Medicine

## 2013-08-11 ENCOUNTER — Encounter (HOSPITAL_COMMUNITY): Payer: Self-pay | Admitting: *Deleted

## 2013-08-11 DIAGNOSIS — K257 Chronic gastric ulcer without hemorrhage or perforation: Secondary | ICD-10-CM | POA: Diagnosis present

## 2013-08-11 DIAGNOSIS — K2981 Duodenitis with bleeding: Secondary | ICD-10-CM | POA: Diagnosis present

## 2013-08-11 DIAGNOSIS — I369 Nonrheumatic tricuspid valve disorder, unspecified: Secondary | ICD-10-CM

## 2013-08-11 DIAGNOSIS — K269 Duodenal ulcer, unspecified as acute or chronic, without hemorrhage or perforation: Secondary | ICD-10-CM | POA: Diagnosis present

## 2013-08-11 HISTORY — PX: COLONOSCOPY: SHX5424

## 2013-08-11 HISTORY — PX: ESOPHAGOGASTRODUODENOSCOPY: SHX5428

## 2013-08-11 LAB — CBC WITH DIFFERENTIAL/PLATELET
Basophils Absolute: 0 10*3/uL (ref 0.0–0.1)
Basophils Relative: 0 % (ref 0–1)
EOS PCT: 1 % (ref 0–5)
Eosinophils Absolute: 0.1 10*3/uL (ref 0.0–0.7)
HEMATOCRIT: 34.7 % — AB (ref 39.0–52.0)
HEMOGLOBIN: 11.2 g/dL — AB (ref 13.0–17.0)
Lymphocytes Relative: 13 % (ref 12–46)
Lymphs Abs: 1.6 10*3/uL (ref 0.7–4.0)
MCH: 27.1 pg (ref 26.0–34.0)
MCHC: 32.3 g/dL (ref 30.0–36.0)
MCV: 83.8 fL (ref 78.0–100.0)
MONO ABS: 0.6 10*3/uL (ref 0.1–1.0)
Monocytes Relative: 5 % (ref 3–12)
Neutro Abs: 9.9 10*3/uL — ABNORMAL HIGH (ref 1.7–7.7)
Neutrophils Relative %: 81 % — ABNORMAL HIGH (ref 43–77)
Platelets: 501 10*3/uL — ABNORMAL HIGH (ref 150–400)
RBC: 4.14 MIL/uL — ABNORMAL LOW (ref 4.22–5.81)
RDW: 17 % — AB (ref 11.5–15.5)
WBC: 12.3 10*3/uL — AB (ref 4.0–10.5)

## 2013-08-11 LAB — BASIC METABOLIC PANEL
Anion gap: 17 — ABNORMAL HIGH (ref 5–15)
BUN: 8 mg/dL (ref 6–23)
CALCIUM: 7.7 mg/dL — AB (ref 8.4–10.5)
CHLORIDE: 112 meq/L (ref 96–112)
CO2: 20 meq/L (ref 19–32)
CREATININE: 0.54 mg/dL (ref 0.50–1.35)
GFR calc Af Amer: >90 mL/min (ref >90)
GFR calc non Af Amer: >90 mL/min (ref >90)
GLUCOSE: 80 mg/dL (ref 70–99)
Potassium: 2.8 mEq/L — CL (ref 3.7–5.3)
SODIUM: 149 meq/L — AB (ref 137–147)

## 2013-08-11 LAB — GLUCOSE, CAPILLARY
GLUCOSE-CAPILLARY: 77 mg/dL (ref 70–99)
GLUCOSE-CAPILLARY: 72 mg/dL (ref 70–99)
Glucose-Capillary: 114 mg/dL — ABNORMAL HIGH (ref 70–99)
Glucose-Capillary: 72 mg/dL (ref 70–99)
Glucose-Capillary: 78 mg/dL (ref 70–99)
Glucose-Capillary: 80 mg/dL (ref 70–99)
Glucose-Capillary: 85 mg/dL (ref 70–99)
Glucose-Capillary: 86 mg/dL (ref 70–99)
Glucose-Capillary: 91 mg/dL (ref 70–99)

## 2013-08-11 SURGERY — COLONOSCOPY
Anesthesia: Moderate Sedation

## 2013-08-11 MED ORDER — FENTANYL CITRATE 0.05 MG/ML IJ SOLN
INTRAMUSCULAR | Status: DC | PRN
  Administered 2013-08-11: 25 ug via INTRAVENOUS
  Administered 2013-08-11 (×2): 12.5 ug via INTRAVENOUS
  Administered 2013-08-11: 25 ug via INTRAVENOUS

## 2013-08-11 MED ORDER — POLYETHYLENE GLYCOL 3350 17 G PO PACK
17.0000 g | PACK | Freq: Two times a day (BID) | ORAL | Status: DC
  Administered 2013-08-11 – 2013-08-14 (×5): 17 g via ORAL
  Filled 2013-08-11 (×7): qty 1

## 2013-08-11 MED ORDER — MIDAZOLAM HCL 10 MG/2ML IJ SOLN
INTRAMUSCULAR | Status: DC | PRN
  Administered 2013-08-11: 2 mg via INTRAVENOUS
  Administered 2013-08-11: 1 mg via INTRAVENOUS
  Administered 2013-08-11: 2 mg via INTRAVENOUS
  Administered 2013-08-11: 1 mg via INTRAVENOUS

## 2013-08-11 MED ORDER — DIPHENHYDRAMINE HCL 50 MG/ML IJ SOLN
INTRAMUSCULAR | Status: AC
  Filled 2013-08-11: qty 1

## 2013-08-11 MED ORDER — MIDAZOLAM HCL 5 MG/ML IJ SOLN
INTRAMUSCULAR | Status: AC
  Filled 2013-08-11: qty 2

## 2013-08-11 MED ORDER — VANCOMYCIN HCL 10 G IV SOLR
1250.0000 mg | Freq: Once | INTRAVENOUS | Status: AC
  Administered 2013-08-11: 1250 mg via INTRAVENOUS
  Filled 2013-08-11: qty 1250

## 2013-08-11 MED ORDER — VANCOMYCIN HCL IN DEXTROSE 1-5 GM/200ML-% IV SOLN
1000.0000 mg | Freq: Two times a day (BID) | INTRAVENOUS | Status: DC
  Administered 2013-08-11 – 2013-08-12 (×2): 1000 mg via INTRAVENOUS
  Filled 2013-08-11 (×5): qty 200

## 2013-08-11 MED ORDER — POTASSIUM CHLORIDE 10 MEQ/50ML IV SOLN
10.0000 meq | INTRAVENOUS | Status: AC
  Administered 2013-08-11 (×6): 10 meq via INTRAVENOUS
  Filled 2013-08-11 (×2): qty 50

## 2013-08-11 MED ORDER — FENTANYL CITRATE 0.05 MG/ML IJ SOLN
INTRAMUSCULAR | Status: AC
  Filled 2013-08-11: qty 2

## 2013-08-11 NOTE — Op Note (Addendum)
[facility] [place], [address]   ENDOSCOPY PROCEDURE REPORT  PATIENT: Steven [name], Steven [name]  MR#: [id number] BIRTHDATE: [birth year] , 86  yrs. old GENDER: Male ENDOSCOPIST: Gatha Mayer, MD, [facility] PROCEDURE DATE:  08/11/2013 PROCEDURE:  EGD w/ biopsy ASA CLASS:     Class III INDICATIONS:  Heme positive stool.   Iron deficiency anemia. MEDICATIONS: Fentanyl-Detailed 50 mcg IV and Versed 4 mg IV TOPICAL ANESTHETIC: Cetacaine Spray  DESCRIPTION OF PROCEDURE: After the risks benefits and alternatives of the procedure were thoroughly explained, informed consent was obtained.  The Pentax Gastroscope [id number] endoscope was introduced through the mouth and advanced to the second portion of the duodenum. Without limitations.  The instrument was slowly withdrawn as the mucosa was fully examined.    STOMACH: A large round and clean-based ulcer was found in the prepyloric region of the stomach.  Biopsies were taken at edge of the ulcer and at the center of the ulcer.  DUODENUM: Multiple small medium sized non-bleeding round and clean-based ulcers were found in the duodenal bulb.  The remainder of the upper endoscopy exam was otherwise normal. Retroflexed views revealed no abnormalities.     The scope was then withdrawn from the patient and the procedure completed.  COMPLICATIONS: There were no complications. ENDOSCOPIC IMPRESSION: 1.   Large ulcer was found in the prepyloric region of the stomach 2.   Multiple small medium sized non-bleeding ulcers were found in the duodenal bulb 3.   The remainder of the upper endoscopy exam was otherwise normal  RECOMMENDATIONS: 1.  Avoid NSAIDS - think diclofenac caused the ulcers - may eventually be able to use again once healed but would need PPI chronically (probably will anyway) 2.  Continue PPI bid 3.  Await biopsy results - will most likely need repeat EGD to show healing in a few months 4.  Proceed  with a Colonoscopy.  cc: Lucio Edward, MD eSigned:  Gatha Mayer, MD, [facility] 08/11/2013 5:04 PMRevised: 08/11/2013 5:04 PM

## 2013-08-11 NOTE — Progress Notes (Addendum)
PROGRESS NOTE  Steven [name] JYN:[id number] DOB: 1955/01/15 DOA: 08/07/2013 PCP: [name], MD   Interim history  58 year old male with a history of mental retardation, hyperlipidemia was taken to urgent care because of poor by mouth intake. Apparently the patient has lost a significant amount of weight over the last few weeks. The patient was transferred to the ED and noted to be hypotensive and have leukocytosis with WBC 18.1. His hemoglobin was 5.3 at the time of presentation. With intravenous fluids, his hemoglobin dropped to 3.9. Stool guaiac was positive in the emergency department, and he was provided for history of the patient is on chronic diclofenac for chronic pain. Dr. Benson Norway was consulted and plans for EGD and colonscopy. The patient has received 6 units PRBCs total. The patient was started on intravenous antibiotics for sepsis secondary to healthcare associated pneumonia and possible aspiration. Multiple discussions were held with the family which have confirmed his DO NOT RESUSCITATE status.  Assessment/Plan:  Acute blood loss anemia  -Secondary to GI bleed  -received 6 units PRBCs total --now hgb is stable -Hgb stable presently  -Colonoscopy performed on 08/11/2013 showing normal colon, EGD showed large ulcer in the prepyloric region as well as multiple non-bleeding ulcers in the duodenal bulb -NSAID therapy likely cause -GI recommending PPI BID Sepsis  -SBP now stablized in upper 90s, low 100s -secondary to pneumonia in pt with tachycardia, hypotension, elevated WBC  -Continue Zosyn, d/c vancomycin -procalcitionin--6.78-->2.47 -check cortisol--23.5 Pneumonia  -Concerned about aspiration, with CXR done on 08/10/2013 showing persistent bibasilar changes slightly worse on right -Continue Zosyn -Blood cultures drawn on 08/08/2013 remain negative -Urinalysis did not show pyuria  -Increase WBC partly due to transfusion  Acute Hypoxemic Respiratory Failure -CXR  done on 08/10/2013 showing persistent bibasilar changes slightly worse on right -proBNP--1887 -give dose of lasix IV as pt is 15L positive Hypokalemia -AM labs showing potassium level of 2.8 -IV potassium replacement ordered this morning Acute kidney injury  -Baseline creatinine 0.5-0.8  -Serum creatinine 1.32 at the time of admission  -Improved with IVF Lower extremity edema  -Venous duplex--neg  Thrombocytosis  -Secondary to iron deficiency anemia  -iron sat <10% -start iron when able to take po Family Communication: sisters updated at bedside Disposition Plan: Remain in stepdown  Antibiotics:  Vancomycin 08/08/13>>>08/10/13  Zosyn 08/08/13>>>      Procedures/Studies: Dg Chest 2 View  08/07/2013   CLINICAL DATA:  Rule out infection  EXAM: CHEST  2 VIEW  COMPARISON:  01/27/2012.  FINDINGS: Low volume film with airspace disease in the right mid lung compatible with pneumonia. Left lung is clear. Left upper lobe nodular density seen on the previous study is not evident on today's exam. The cardio pericardial silhouette is enlarged. No edema or pleural effusion. Bones are diffusely demineralized.  IMPRESSION: Right midlung airspace disease, suspicious for pneumonia.   Electronically Signed   By: Misty Stanley M.D.   On: 08/07/2013 21:04   Dg Chest Port 1 View  08/09/2013   CLINICAL DATA:  Decreased O2 saturation.  EXAM: PORTABLE CHEST - 1 VIEW 01:03 a.m.  COMPARISON:  08/08/2013 and 08/07/2013  FINDINGS: The patient has developed and increased infiltrate at the right lung base with new atelectasis and an infiltrate in the left perihilar region as well as new small right pleural effusion. I suspect the findings represent pulmonary edema.  NG tube tip is below the diaphragm. Central venous catheter tip is at the cavoatrial  junction.  IMPRESSION: Progressive infiltrate at the right base with new small right effusion.  New atelectasis in faint infiltrate in the left. I suspect the findings  represent pulmonary edema.   Electronically Signed   By: [name] M.D.   On: 08/09/2013 01:26   Dg Chest Port 1 View  08/08/2013   CLINICAL DATA:  Central line placement.  EXAM: PORTABLE CHEST - 1 VIEW  COMPARISON:  Chest radiograph performed 08/07/2013  FINDINGS: The patient's left subclavian line is seen ending about the cavoatrial junction.  Lung expansion is improved, though evaluation is suboptimal due to patient rotation. Patchy right-sided airspace opacities raise concern for pneumonia, perhaps slightly more confluent than on the prior study. The left lung appears clear. No pleural effusion or pneumothorax is seen.  The cardiomediastinal silhouette is normal in size. No acute osseous abnormalities are identified.  IMPRESSION: 1. Left subclavian line seen ending about the cavoatrial junction. 2. Patchy right-sided airspace opacities raise concern for pneumonia, perhaps slightly more confluent than on the prior study.   Electronically Signed   By: Garald Balding M.D.   On: 08/08/2013 01:49   Dg Abd Portable 1v  08/08/2013   CLINICAL DATA:  NG tube placement.  EXAM: PORTABLE ABDOMEN - 1 VIEW  COMPARISON:  None.  FINDINGS: An NG tube is identified with tip overlying the proximal-mid stomach.  The bowel gas pattern is unremarkable.  IMPRESSION: NG tube with tip overlying the proximal -mid stomach.   Electronically Signed   By: [name] M.D.   On: 08/08/2013 15:55         Subjective: Pt is more alert. Limited ROS due to mental retardation  Objective: Filed Vitals:   08/11/13 1655 08/11/13 1700 08/11/13 1705 08/11/13 1710  BP: 124/76     Pulse: 87 85 83 81  Temp:      TempSrc:      Resp: 29 24 24 26   Height:      Weight:      SpO2: 96% 97% 93% 92%    Intake/Output Summary (Last 24 hours) at 08/11/13 1826 Last data filed at 08/10/13 2017  Gross per 24 hour  Intake     50 ml  Output      1 ml  Net     49 ml   Weight change:  Exam:   General:  Pt is alert, does not follow  commands appropriately, not in acute distress  HEENT: No icterus, No thrush,  [place]/AT  Cardiovascular: RRR, S1/S2, no rubs, no gallops  Respiratory: bilateral scattered crackles without wheeze  Abdomen: Soft/+BS, non tender, non distended, no guarding  Extremities: 2+LE edema, No lymphangitis, No petechiae, No rashes, no synovitis  Data Reviewed: Basic Metabolic Panel:  Recent Labs Lab 08/07/13 1735 08/08/13 0530 08/09/13 0320 08/09/13 0418 08/10/13 0415 08/11/13 0450  NA 139 144 147  --  146 149*  K 4.3 3.5* 2.9*  --  3.0* 2.8*  CL 99 107 111  --  114* 112  CO2 25 21 21   --  20 20  GLUCOSE 130* 111* 96  --  113* 80  BUN 46* 41* 22  --  12 8  CREATININE 1.32 0.90 0.60  --  0.57 0.54  CALCIUM 8.4 7.3* 7.5*  --  7.7* 7.7*  MG  --   --   --  2.2  --   --    Liver Function Tests:  Recent Labs Lab 08/07/13 1735 08/08/13 0530  AST 18 15  ALT 24 18  ALKPHOS 85 67  BILITOT 0.2* 0.8  PROT 6.1 4.7*  ALBUMIN 2.0* 1.5*   No results found for this basename: LIPASE, AMYLASE,  in the last 168 hours No results found for this basename: AMMONIA,  in the last 168 hours CBC:  Recent Labs Lab 08/07/13 1735  08/08/13 0530 08/08/13 1230 08/09/13 0320 08/10/13 0415 08/11/13 0450  WBC 18.1*  < > 27.7* 20.9* 17.3* 17.9* 12.3*  NEUTROABS 15.8*  --  26.3* 19.0*  --   --  9.9*  HGB 5.3*  < > 6.5* 7.9* 10.3* 10.9* 11.2*  HCT 17.8*  < > 20.7* 23.9* 30.9* 33.8* 34.7*  MCV 79.1  < > 85.2 83.6 84.2 83.5 83.8  PLT 992*  < > 611* 498* 422* 482* 501*  < > = values in this interval not displayed. Cardiac Enzymes: No results found for this basename: CKTOTAL, CKMB, CKMBINDEX, TROPONINI,  in the last 168 hours BNP: No components found with this basename: POCBNP,  CBG:  Recent Labs Lab 08/11/13 0739 08/11/13 1126 08/11/13 1227 08/11/13 1440 08/11/13 1520  GLUCAP 80 72 77 72 78    Recent Results (from the past 240 hour(s))  CULTURE, BLOOD (ROUTINE X 2)     Status: None    Collection Time    08/07/13  7:37 PM      Result Value Ref Range Status   Specimen Description BLOOD LEFT ARM   Final   Special Requests BOTTLES DRAWN AEROBIC AND ANAEROBIC 6ML   Final   Culture  Setup Time     Final   Value: 08/08/2013 14:49     Performed at [facility]   Culture     Final   Value: GRAM POSITIVE COCCI IN CLUSTERS        BLOOD CULTURE RECEIVED NO GROWTH TO DATE CULTURE WILL BE HELD FOR 5 DAYS BEFORE ISSUING A FINAL NEGATIVE REPORT     Note: Gram Stain Report Called to,Read Back By and Verified With: LEE WORLEY RN [id number]     Performed at [facility]   Report Status PENDING   Incomplete  CULTURE, BLOOD (ROUTINE X 2)     Status: None   Collection Time    08/07/13  9:45 PM      Result Value Ref Range Status   Specimen Description BLOOD LEFT HAND   Final   Special Requests BOTTLES DRAWN AEROBIC AND ANAEROBIC 5 CC   Final   Culture  Setup Time     Final   Value: 08/08/2013 14:49     Performed at [facility]   Culture     Final   Value:        BLOOD CULTURE RECEIVED NO GROWTH TO DATE CULTURE WILL BE HELD FOR 5 DAYS BEFORE ISSUING A FINAL NEGATIVE REPORT     Performed at [facility]   Report Status PENDING   Incomplete  MRSA PCR SCREENING     Status: None   Collection Time    08/07/13  9:52 PM      Result Value Ref Range Status   MRSA by PCR NEGATIVE  NEGATIVE Final   Comment:            The GeneXpert MRSA Assay (FDA     approved for NASAL specimens     only), is one component of a     comprehensive MRSA colonization     surveillance program. It is not  intended to diagnose MRSA     infection nor to guide or     monitor treatment for     MRSA infections.     Scheduled Meds: . antiseptic oral rinse  15 mL Mouth Rinse q12n4p  . chlorhexidine  10 mL Mouth/Throat TID  . pantoprazole (PROTONIX) IV  40 mg Intravenous Q12H  . piperacillin-tazobactam (ZOSYN)  IV  3.375 g Intravenous Q8H  . vancomycin  1,000 mg Intravenous Q12H    Continuous Infusions:    Kelvin Cellar, DO  [facility] Pager [phone]  If 7PM-7AM, please contact night-coverage [email or web address] Password [email or web address] 08/11/2013, 6:26 PM   LOS: 4 days

## 2013-08-11 NOTE — Progress Notes (Signed)
Echocardiogram 2D Echocardiogram has been performed.  Joelene Millin 08/11/2013, 1:59 PM

## 2013-08-11 NOTE — Clinical Social Work Psychosocial (Signed)
Clinical Social Work Department BRIEF PSYCHOSOCIAL ASSESSMENT 08/11/2013  Patient:  Steven [name], Steven [name]     Account Number:  [id number]     Admit date:  08/07/2013  Clinical Social Worker:  Lovey Newcomer  Date/Time:  08/10/2013 09:52 AM  Referred by:  Physician  Date Referred:  08/10/2013 Referred for  SNF Placement   Other Referral:   Interview type:  Family Other interview type:    PSYCHOSOCIAL DATA Living Status:  FACILITY Admitted from facility:  Other Level of care:  Group Home Primary support name:  [name] Primary support relationship to patient:  SIBLING Degree of support available:   Support is good.    CURRENT CONCERNS Current Concerns  Post-Acute Placement   Other Concerns:    SOCIAL WORK ASSESSMENT / PLAN CSW met with group home administrator to complete assessment. Per administrator the patient has been at [facility] group home for over 20 years. Per administrator the patient is welcome to return to the group home as long as he can be managed there. CSW assist as appropriate.   Assessment/plan status:  Psychosocial Support/Ongoing Assessment of Needs Other assessment/ plan:   Complete Fl2, Fax, PASRR if SNF is needed.   Information/referral to community resources:   CSW contact information given to Scientist, physiological.    PATIENT'S/FAMILY'S RESPONSE TO PLAN OF CARE: Plan is for patient to return to group home when medically stable, unless other recommendation is made. CSW will assist.       Liz Beach MSW, [place], [place], [phone]

## 2013-08-11 NOTE — Care Management Note (Signed)
    Page 1 of 2   08/13/2013     4:10:28 PM CARE MANAGEMENT NOTE 08/13/2013  Patient:  Steven [name], Steven [name]   Account Number:  [id number]  Date Initiated:  08/11/2013  Documentation initiated by:  Steven [name]  Subjective/Objective Assessment:   Pt admitted with severe anemia - GI following for EGD/colonoscopy     Action/Plan:   PTA pt was from [facility]- will need PT/OT evals to assist in determining if pt can return to group home vs needing higher level of care-   Anticipated DC Date:  08/14/2013   Anticipated DC Plan:  GROUP HOME  In-house referral  Clinical Social Worker      DC Forensic scientist  CM consult      Steven [name]  HOME HEALTH   Choice offered to / List presented to:  C-2 HC POA / Guardian   DME arranged  OTHER - SEE COMMENT      DME agency  Steven [name] arranged  Steven [name].   Status of service:  Completed, signed off Medicare Important Message given?  YES (If response is "NO", the following Medicare IM given date fields will be blank) Date Medicare IM given:  08/11/2013 Medicare IM given by:  Steven [name] Date Additional Medicare IM given:  08/13/2013 Additional Medicare IM given by:  Steven [name]  Discharge Disposition:  Steven [name]  Per UR Regulation:  Reviewed for med. necessity/level of care/duration of stay  If discussed at Steven [name] of Stay Meetings, dates discussed:   08/12/2013    Comments:  08/13/13 Steven [name], BSN [phone] NCM left message for patients sister additional IM given to patient today , left in his room.  Pt has MR.  Per physical therapy rec snf, patient's sister refuses snf and would like patient to go back to group home, rep from group home also here in the room, Steven [name] she will be coordinating the delivery of the hoyer lift , will need to be delivered to Steven [name] at Steven [name]. Steven [name] Steven [name] [phone]. Also they chose  Steven [name] for HHPT, referal made to [name], [place] notified.  Soc will begin 24-48 hrs post dc.  08/11/13- 1110- Steven Gibbons RN, BSN [phone] Spoke with pt's sister at bedside- copy of admission Medicare IM given to sister (pt has hx of MR)- pt to have EGD/colonoscopy today- will need to have PT/OT evals to assist with d/c plans- MD please place order for PT/OT

## 2013-08-11 NOTE — Clinical Documentation Improvement (Signed)
  Abnormal findings (laboratory, x-ray, MRI/CT scans, and other diagnostic results) are not coded and reported unless the physician indicates their clinical significance. The medical record reflects the following abnormal potassium level.  If possible, please help by clarifying the diagnostic and/or clinical significance of this abnormal level. Please take into consideration potassium chloride 10 mEq in 50 mL IVPB was given 3 times  on 08/11/13.   Component Potassium  Latest Ref Rng 3.7 - 5.3 mEq/L  08/07/2013 4.3  08/08/2013 3.5 (L)  08/09/2013 2.9 (LL)  08/10/2013 3.0 (L)  08/11/2013 2.8 (LL)   Possible Clinical Conditions?   - Hypokalemia                               -Other Condition                -Not Clinically Significant     Thank You, Hartley Barefoot ,RN Clinical Documentation Specialist:  [facility]

## 2013-08-11 NOTE — Progress Notes (Signed)
ANTIBIOTIC CONSULT NOTE - INITIAL  Pharmacy Consult for Vancomcyin Indication: bacteremia  Allergies  Allergen Reactions  . Haldol [Haloperidol Lactate] Palpitations    Sleeps for days    Patient Measurements: Height: 5\' 3"  (160 cm) (estimate given by caregivers) Weight: 133 lb 6.1 oz (60.5 kg) IBW/kg (Calculated) : 56.9  Vital Signs: Temp: 98.1 F (36.7 C) (07/28 2337) Temp src: Axillary (07/28 2337) BP: 109/58 mmHg (07/28 2337) Pulse Rate: 72 (07/28 2337) Intake/Output from previous day: 07/28 0701 - 07/29 0700 In: 3300 [NG/GT:3000; IV Piggyback:300] Out: 1 [Urine:1] Intake/Output from this shift: Total I/O In: 50 [IV Piggyback:50] Out: 1 [Urine:1]  Labs:  Recent Labs  08/08/13 0530 08/08/13 1230 08/09/13 0320 08/10/13 0415  WBC 27.7* 20.9* 17.3* 17.9*  HGB 6.5* 7.9* 10.3* 10.9*  PLT 611* 498* 422* 482*  CREATININE 0.90  --  0.60 0.57   Estimated Creatinine Clearance: 80 ml/min (by C-G formula based on Cr of 0.57). No results found for this basename: VANCOTROUGH, VANCOPEAK, VANCORANDOM, GENTTROUGH, GENTPEAK, GENTRANDOM, TOBRATROUGH, TOBRAPEAK, TOBRARND, AMIKACINPEAK, AMIKACINTROU, AMIKACIN,  in the last 72 hours   Microbiology: Recent Results (from the past 720 hour(s))  CULTURE, BLOOD (ROUTINE X 2)     Status: None   Collection Time    08/07/13  7:37 PM      Result Value Ref Range Status   Specimen Description BLOOD LEFT ARM   Final   Special Requests BOTTLES DRAWN AEROBIC AND ANAEROBIC 6ML   Final   Culture  Setup Time     Final   Value: 08/08/2013 14:49     Performed at [facility]   Culture     Final   Value: [place] IN CLUSTERS        BLOOD CULTURE RECEIVED NO GROWTH TO DATE CULTURE WILL BE HELD FOR 5 DAYS BEFORE ISSUING A FINAL NEGATIVE REPORT     Note: Gram Stain Report Called to,Read Back By and Verified With: LEE WORLEY RN [id number]     Performed at [facility]   Report Status PENDING   Incomplete  CULTURE, BLOOD  (ROUTINE X 2)     Status: None   Collection Time    08/07/13  9:45 PM      Result Value Ref Range Status   Specimen Description BLOOD LEFT HAND   Final   Special Requests BOTTLES DRAWN AEROBIC AND ANAEROBIC 5 CC   Final   Culture  Setup Time     Final   Value: 08/08/2013 14:49     Performed at [facility]   Culture     Final   Value:        BLOOD CULTURE RECEIVED NO GROWTH TO DATE CULTURE WILL BE HELD FOR 5 DAYS BEFORE ISSUING A FINAL NEGATIVE REPORT     Performed at [facility]   Report Status PENDING   Incomplete  MRSA PCR SCREENING     Status: None   Collection Time    08/07/13  9:52 PM      Result Value Ref Range Status   MRSA by PCR NEGATIVE  NEGATIVE Final   Comment:            The GeneXpert MRSA Assay (FDA     approved for NASAL specimens     only), is one component of a     comprehensive MRSA colonization     surveillance program. It is not     intended to diagnose MRSA  infection nor to guide or     monitor treatment for     MRSA infections.    Medical History: Past Medical History  Diagnosis Date  . Hyperlipidemia   . Anxiety   . Mental retardation     ue to temp of 108 aftrer having measles age 32.  . Hx of diaper rash   . Edema of foot 02/06/2012    BILATERAL    Medications:  Scheduled:  . antiseptic oral rinse  15 mL Mouth Rinse q12n4p  . chlorhexidine  10 mL Mouth/Throat TID  . pantoprazole (PROTONIX) IV  40 mg Intravenous Q12H  . piperacillin-tazobactam (ZOSYN)  IV  3.375 g Intravenous Q8H   Assessment: 58 yo male with 1/2 blood cultures growing GPC for empiric antibiotics  Goal of Therapy:  Vancomycin trough level 15-20 mcg/ml  Plan:  Vancomycin 1250 mg IV now, then 1 g IV q12h  Lezley Bedgood, Bronson Curb 08/11/2013,2:12 AM

## 2013-08-11 NOTE — Op Note (Signed)
[facility] [place], [address]   COLONOSCOPY PROCEDURE REPORT  PATIENT: [name], Steven  MR#: [id number] BIRTHDATE: 01/03/1956 , 18  yrs. old GENDER: Male ENDOSCOPIST: Gatha Mayer, MD, [facility] PROCEDURE DATE:  08/11/2013 PROCEDURE:   Colonoscopy, diagnostic ASA CLASS:   Class III INDICATIONS:Iron Deficiency Anemia and heme-positive stool. MEDICATIONS: There was residual sedation effect present from prior procedure, Fentanyl 25 mcg IV, and Versed 2 mg IV  DESCRIPTION OF PROCEDURE:   After the risks benefits and alternatives of the procedure were thoroughly explained, informed consent was obtained.  A digital rectal exam revealed no rectal mass.   The EC-[id number] ([id number])  endoscope was introduced through the anus and advanced to the cecum, which was identified by both the appendix and ileocecal valve. No adverse events experienced. The quality of the prep was adequate, using MoviPrep - 3 days of prep with NG tube  The instrument was then slowly withdrawn as the colon was fully examined.      COLON FINDINGS: A normal appearing cecum, ileocecal valve, and appendiceal orifice were identified.  The ascending, hepatic flexure, transverse, splenic flexure, descending, sigmoid colon and rectum appeared unremarkable.  No polyps or cancers were seen. Retroflexed views revealed no abnormalities.  The scope was withdrawn and the procedure completed. COMPLICATIONS: There were no complications.  ENDOSCOPIC IMPRESSION: Normal colon  RECOMMENDATIONS: MiraLax BID May need periodic enema also Iron Tx Await EGD bxs of ulcers - think NSAID's were problem   eSigned:  Gatha Mayer, MD, [facility] 08/11/2013 5:02 PM

## 2013-08-12 ENCOUNTER — Inpatient Hospital Stay (HOSPITAL_COMMUNITY): Payer: Medicare Other

## 2013-08-12 ENCOUNTER — Encounter (HOSPITAL_COMMUNITY): Payer: Self-pay | Admitting: Internal Medicine

## 2013-08-12 DIAGNOSIS — D500 Iron deficiency anemia secondary to blood loss (chronic): Secondary | ICD-10-CM

## 2013-08-12 DIAGNOSIS — D649 Anemia, unspecified: Secondary | ICD-10-CM

## 2013-08-12 LAB — PROCALCITONIN: Procalcitonin: 0.42 ng/mL

## 2013-08-12 LAB — BASIC METABOLIC PANEL
Anion gap: 14 (ref 5–15)
BUN: 6 mg/dL (ref 6–23)
CO2: 21 mEq/L (ref 19–32)
Calcium: 7.6 mg/dL — ABNORMAL LOW (ref 8.4–10.5)
Chloride: 112 mEq/L (ref 96–112)
Creatinine, Ser: 0.47 mg/dL — ABNORMAL LOW (ref 0.50–1.35)
Glucose, Bld: 98 mg/dL (ref 70–99)
Potassium: 3.2 mEq/L — ABNORMAL LOW (ref 3.7–5.3)
Sodium: 147 mEq/L (ref 137–147)

## 2013-08-12 LAB — CULTURE, BLOOD (ROUTINE X 2)

## 2013-08-12 LAB — CBC
HEMATOCRIT: 34.8 % — AB (ref 39.0–52.0)
Hemoglobin: 11.1 g/dL — ABNORMAL LOW (ref 13.0–17.0)
MCH: 26.9 pg (ref 26.0–34.0)
MCHC: 31.9 g/dL (ref 30.0–36.0)
MCV: 84.5 fL (ref 78.0–100.0)
Platelets: 488 10*3/uL — ABNORMAL HIGH (ref 150–400)
RBC: 4.12 MIL/uL — ABNORMAL LOW (ref 4.22–5.81)
RDW: 17.3 % — AB (ref 11.5–15.5)
WBC: 12.8 10*3/uL — ABNORMAL HIGH (ref 4.0–10.5)

## 2013-08-12 LAB — GLUCOSE, CAPILLARY
GLUCOSE-CAPILLARY: 178 mg/dL — AB (ref 70–99)
Glucose-Capillary: 113 mg/dL — ABNORMAL HIGH (ref 70–99)
Glucose-Capillary: 120 mg/dL — ABNORMAL HIGH (ref 70–99)
Glucose-Capillary: 177 mg/dL — ABNORMAL HIGH (ref 70–99)
Glucose-Capillary: 187 mg/dL — ABNORMAL HIGH (ref 70–99)

## 2013-08-12 MED ORDER — POTASSIUM CHLORIDE CRYS ER 20 MEQ PO TBCR
40.0000 meq | EXTENDED_RELEASE_TABLET | Freq: Four times a day (QID) | ORAL | Status: AC
Start: 2013-08-12 — End: 2013-08-13
  Administered 2013-08-12 (×2): 40 meq via ORAL
  Filled 2013-08-12 (×2): qty 2

## 2013-08-12 MED ORDER — PANTOPRAZOLE SODIUM 40 MG PO TBEC
40.0000 mg | DELAYED_RELEASE_TABLET | Freq: Two times a day (BID) | ORAL | Status: DC
  Administered 2013-08-12 – 2013-08-14 (×3): 40 mg via ORAL
  Filled 2013-08-12 (×4): qty 1

## 2013-08-12 MED ORDER — AMOXICILLIN-POT CLAVULANATE 875-125 MG PO TABS
1.0000 | ORAL_TABLET | Freq: Two times a day (BID) | ORAL | Status: DC
  Administered 2013-08-13 – 2013-08-14 (×2): 1 via ORAL
  Filled 2013-08-12 (×5): qty 1

## 2013-08-12 MED ORDER — FERUMOXYTOL INJECTION 510 MG/17 ML
510.0000 mg | Freq: Once | INTRAVENOUS | Status: AC
  Administered 2013-08-12: 510 mg via INTRAVENOUS
  Filled 2013-08-12: qty 17

## 2013-08-12 MED ORDER — VANCOMYCIN HCL IN DEXTROSE 1-5 GM/200ML-% IV SOLN
1000.0000 mg | Freq: Two times a day (BID) | INTRAVENOUS | Status: DC
  Filled 2013-08-12 (×2): qty 200

## 2013-08-12 NOTE — Progress Notes (Signed)
NUTRITION FOLLOW UP  Intervention:   - Downgrade diet dysphagia 3/thin as pt requires chopped meats per caregiver report - [employer] Breakfast TID - RD to continue to monitor   Nutrition Dx:   Malnutrition related to inadequate oral intake as evidenced by severe depletion of muscle and subcutaneous fat mass with reported 19% weight loss over the past 6 months - ongoing    Goal:   Intake to meet >90% of estimated nutrition needs - not met   Monitor:   Weights, labs, intake   Assessment:   58 y.o. male with history of mental retardation and hyperlipidemia was urgently brought to the urgent care Center because patient was having poor appetite and not eating well. Per the patient's caregiver and patient's sister patient has been losing weight over the last few weeks and has been hardly eating anything. Chest x-ray shows possible pneumonia.   7/27: -Plans for endoscopy tomorrow.  -Caregiver in patient's room reports that patient usually eats really well up until a few weeks ago. She is unsure of his usual weight, but guesses ~165 lb.  -Pt meets criteria for severe MALNUTRITION in the context of chronic illness as evidenced by severe depletion of muscle and subcutaneous fat mass with reported 19% weight loss over the past 6 months.  7/30: - Had EGD yesterday that showed large ulcer in prepyloric region and multiple small medium sized non-bleeding ulcers in duodenal bulb - Colonoscopy yesterday showed normal colon  - Per GI, pt did not like most of the breakfast this morning, ate the grits but not the eggs or home fries - Met with pt, caregiver, and family - pt was awaiting lunch - Caregiver states that pt's diet PTA changed from 1800 calorie no salt added to regular diet with chopped meats and [employer] Breakfast TID, will order  Potassium low, getting oral replacement   Height: Ht Readings from Last 1 Encounters:  08/07/13 $RemoveB'5\' 3"'YfEqhtAP$  (1.6 m)    Weight Status:   Wt  Readings from Last 1 Encounters:  08/12/13 151 lb 0.2 oz (68.5 kg)  Admit wt         133 lb 6.1 oz (60.5 kg)  Net I/Os: +18L  Re-estimated needs:  Kcal: 1610-9604 Protein: 75-90g Fluid: per MD  Skin: +3 RLE, LLE edema, +2 sacral edema, stage 2 left foot pressure ulcer   Diet Order: Criss Rosales   Intake/Output Summary (Last 24 hours) at 08/12/13 1331 Last data filed at 08/11/13 2025  Gross per 24 hour  Intake    250 ml  Output      0 ml  Net    250 ml    Last BM: 7/28   Labs:   Recent Labs Lab 08/09/13 0418 08/10/13 0415 08/11/13 0450 08/12/13 0400  NA  --  146 149* 147  K  --  3.0* 2.8* 3.2*  CL  --  114* 112 112  CO2  --  $R'20 20 21  'gL$ BUN  --  $R'12 8 6  'km$ CREATININE  --  0.57 0.54 0.47*  CALCIUM  --  7.7* 7.7* 7.6*  MG 2.2  --   --   --   GLUCOSE  --  113* 80 98    CBG (last 3)   Recent Labs  08/12/13 0314 08/12/13 0734 08/12/13 1238  GLUCAP 113* 120* 178*    Scheduled Meds: . pantoprazole  40 mg Oral BID  . piperacillin-tazobactam (ZOSYN)  IV  3.375 g Intravenous Q8H  . polyethylene glycol  17 g Oral BID  . potassium chloride  40 mEq Oral Q6H  . vancomycin  1,000 mg Intravenous Q12H     Carlis Stable MS, RD, LDN [phone] Pager [phone] Weekend/After Hours Pager

## 2013-08-12 NOTE — Progress Notes (Signed)
PT Cancellation Note  Patient Details Name: Steven [name] MRN: [id number] DOB: 12-Apr-1955   Cancelled Treatment:    Reason Eval/Treat Not Completed: Patient declined, no reason specified Pt been waiting for meal tray all day from lunch. Tray finally arrived upon PT arrival. Per discussion with family/caregiver, pt requires 2 person assist for mobility. Will follow up on 7/31.   Candy Sledge A 08/12/2013, 3:39 PM Candy Sledge, [place], DPT [phone]

## 2013-08-12 NOTE — Clinical Documentation Improvement (Signed)
   Clinical Indicators:  Per Registered Dietician assessment dated 08/09/13 at 14:49 "Skin: stage 2 pressure ulcer to left foot" Per doc flowsheets, nursing documents blister on left posterior great toe.    No WOC assessment this admission.   Possible Clinical Conditions including Present On Admission (POA) if applicable:  Stage  I  Pressure Ulcer   (reddening of the skin)  Stage  II Pressure Ulcer  (blister open or unopened) Stage  III Pressure Ulcer (through all layers skin) Stage IV Pressure Ulcer   (through skin & underlying  muscle, tendons, and bones) Other Condition  Thank You, Erling Conte ,RN Clinical Documentation Specialist:  [phone] [place] [employer] [facility]

## 2013-08-12 NOTE — Progress Notes (Signed)
Daily Rounding Note  08/12/2013, 9:21 AM  LOS: 5 days   SUBJECTIVE:       Did not like  Most of the breakfast.  Ate the grits but not the eggs or home fries.   OBJECTIVE:         Vital signs in last 24 hours:    Temp:  [97.8 F (36.6 C)-99.1 F (37.3 C)] 98.4 F (36.9 C) (07/30 0735) Pulse Rate:  [29-91] 82 (07/30 0735) Resp:  [18-51] 30 (07/30 0735) BP: (101-138)/(25-106) 123/69 mmHg (07/30 0735) SpO2:  [92 %-97 %] 93 % (07/30 0735) Last BM Date: 08/10/13 General: pleasant, alert,    Heart: RRR Chest: clear bil.  Some cough.  No dyspnea Abdomen: soft, NT, active BS  Extremities: mild edema in legs.  ulcer Neuro/Psych:  Pleasant, limited speech but appropriate and follows commands  Intake/Output from previous day: 07/29 0701 - 07/30 0700 In: 250 [IV Piggyback:250] Out: -   Intake/Output this shift:    Lab Results:  Recent Labs  08/10/13 0415 08/11/13 0450 08/12/13 0400  WBC 17.9* 12.3* 12.8*  HGB 10.9* 11.2* 11.1*  HCT 33.8* 34.7* 34.8*  PLT 482* 501* 488*   BMET  Recent Labs  08/10/13 0415 08/11/13 0450 08/12/13 0400  NA 146 149* 147  K 3.0* 2.8* 3.2*  CL 114* 112 112  CO2 20 20 21   GLUCOSE 113* 80 98  BUN 12 8 6   CREATININE 0.57 0.54 0.47*  CALCIUM 7.7* 7.7* 7.6*    Studies/Results: Dg Chest Port 1 View  08/10/2013   CLINICAL DATA:  Dyspnea  EXAM: PORTABLE CHEST - 1 VIEW  COMPARISON:  08/09/2013  FINDINGS: A left-sided central venous line and nasogastric catheter are again noted in satisfactory position. The cardiac shadow remains enlarged. Patient is significantly rotated to the right accentuating the mediastinal markings. Bibasilar infiltrative changes are again seen slightly worse on the right than the left. No acute bony abnormality is noted.  IMPRESSION: Persistent bibasilar changes.   Electronically Signed   By: Inez Catalina M.D.   On: 08/10/2013 12:59   Scheduled Meds: .  antiseptic oral rinse  15 mL Mouth Rinse q12n4p  . chlorhexidine  10 mL Mouth/Throat TID  . pantoprazole (PROTONIX) IV  40 mg Intravenous Q12H  . piperacillin-tazobactam (ZOSYN)  IV  3.375 g Intravenous Q8H  . polyethylene glycol  17 g Oral BID  . potassium chloride  40 mEq Oral Q6H  . vancomycin  1,000 mg Intravenous Q12H   Continuous Infusions:  PRN Meds:.acetaminophen, acetaminophen, albuterol, ondansetron (ZOFRAN) IV, ondansetron    ASSESMENT:   *  Anemia (Hgb 3.9), weight loss, anorexia Iron deficient. S/p 6 PRBCs Chronic Diclofenac EGD 7/29: large pre-pyloric ulcer, many medium ulcer in duodenal bulb.  This is source of blood loss and likely source anorexia and weight loss Colonoscopy 7/29: normal study.   *  Constipation.  Took 3 days and 3 separate full bowel preps to clear colon, even then still had some retained stool.   *  Pneumonia.  On Zosyn, Vanc.   *  Hypokalemia.    PLAN   *  Avoid NSAIDs, Diclofenac likely cause of ulcers.  If restarted prn in a few months, should be on PPI. *  BID PPI for one month. Then switch to daily.   *  May need EGD in several weeks to assure healing.  *  Laxative regimen.  For now will stay with BID Miralax.  He may require senna or other stimulant laxative.  *  Would not start po Iron given degree of constipation.   Consider parenteral iron, though if anemia resolves with healing of ulcers and return of adequate po intake, may be able to avoid iron supplements altogether.  *  HOB at 40 degrees or more, best would be up into chair during day.  This will allow for gravity to assist stomach emptying, reduce risk of aspiration.  *  Follow up on biopsies, treat if H Pylori +.  *  Defer potassium supplementation to ttending MD   Azucena Freed  08/12/2013, 9:21 AM Pager: [phone]  [facility] Attending  I have also seen and assessed the patient and agree with the above note.  Will Tx w/ IV iron Call if ?  Gatha Mayer, MD,  [facility] [phone] (pager) 08/12/2013 5:06 PM

## 2013-08-12 NOTE — Progress Notes (Signed)
Utilization review completed.  

## 2013-08-12 NOTE — Progress Notes (Signed)
Pt tx to [facility] per MD order, pt VSS, pt tol well, family called, family directed to room from [address] report called to receiving RN all questions answered, pt tol tx well,

## 2013-08-12 NOTE — Progress Notes (Signed)
PROGRESS NOTE  Steven [name] JKD:[id number] DOB: Aug 05, 1955 DOA: 08/07/2013 PCP: [name], MD   Interim history  58 year old male with a history of mental retardation, hyperlipidemia was taken to urgent care because of poor by mouth intake. Apparently the patient has lost a significant amount of weight over the last few weeks. The patient was transferred to the ED and noted to be hypotensive and have leukocytosis with WBC 18.1. His hemoglobin was 5.3 at the time of presentation. With intravenous fluids, his hemoglobin dropped to 3.9. Stool guaiac was positive in the emergency department, and he was provided for history of the patient is on chronic diclofenac for chronic pain. Dr. Benson Norway was consulted and plans for EGD and colonscopy. The patient has received 6 units PRBCs total. The patient was started on intravenous antibiotics for sepsis secondary to healthcare associated pneumonia and possible aspiration. Multiple discussions were held with the family which have confirmed his DO NOT RESUSCITATE status.  Assessment/Plan:  Acute blood loss anemia  -Secondary to GI bleed  -received 6 units PRBCs total  -Am labs showing Hgb of 11.1 -Colonoscopy performed on 08/11/2013 showing normal colon, EGD showed large ulcer in the prepyloric region as well as multiple non-bleeding ulcers in the duodenal bulb -NSAID therapy likely cause -GI recommending PPI BID Sepsis  -SBP now stablized in upper 90s, low 100s -secondary to pneumonia in pt with tachycardia, hypotension, elevated WBC  -Continue Zosyn, d/c vancomycin -procalcitionin--6.78-->2.47 -check cortisol--23.5 Pneumonia  -Concerned about aspiration, with CXR done on 08/10/2013 showing persistent bibasilar changes slightly worse on right -Patient remaining afebrile, will stop IV Zosyn and start Augmentin -Blood cultures drawn on 08/08/2013 remain negative -Urinalysis did not show pyuria  -Increase WBC partly due to transfusion    Suspected Aspiration -Patient with a history of MR, having right sided infiltrate on CXR -Speech Path was consulted who recommended Dysphagia 3 with thin liquid Acute Hypoxemic Respiratory Failure -CXR done on 08/10/2013 showing persistent bibasilar changes slightly worse on right -proBNP--1887 -give dose of lasix IV as pt is 15L positive Hypokalemia -AM labs showing improved  potassium level of 3.2 -Will provide additional potassium replacement Acute kidney injury  -Resolved Lower extremity edema  -Venous duplex--neg  Thrombocytosis  -Secondary to iron deficiency anemia  -iron sat <10% -start iron when able to take po Family Communication: sisters updated at bedside Disposition Plan: Remain in stepdown  Antibiotics:  Vancomycin 08/08/13>>>08/10/13  Zosyn 08/08/13>>>      Procedures/Studies: Dg Chest 2 View  08/07/2013   CLINICAL DATA:  Rule out infection  EXAM: CHEST  2 VIEW  COMPARISON:  01/27/2012.  FINDINGS: Low volume film with airspace disease in the right mid lung compatible with pneumonia. Left lung is clear. Left upper lobe nodular density seen on the previous study is not evident on today's exam. The cardio pericardial silhouette is enlarged. No edema or pleural effusion. Bones are diffusely demineralized.  IMPRESSION: Right midlung airspace disease, suspicious for pneumonia.   Electronically Signed   By: Misty Stanley M.D.   On: 08/07/2013 21:04   Dg Chest Port 1 View  08/09/2013   CLINICAL DATA:  Decreased O2 saturation.  EXAM: PORTABLE CHEST - 1 VIEW 01:03 a.m.  COMPARISON:  08/08/2013 and 08/07/2013  FINDINGS: The patient has developed and increased infiltrate at the right lung base with new atelectasis and an infiltrate in the left perihilar region as well as new small right pleural effusion. I suspect the findings  represent pulmonary edema.  NG tube tip is below the diaphragm. Central venous catheter tip is at the cavoatrial junction.  IMPRESSION: Progressive  infiltrate at the right base with new small right effusion.  New atelectasis in faint infiltrate in the left. I suspect the findings represent pulmonary edema.   Electronically Signed   By: [name] M.D.   On: 08/09/2013 01:26   Dg Chest Port 1 View  08/08/2013   CLINICAL DATA:  Central line placement.  EXAM: PORTABLE CHEST - 1 VIEW  COMPARISON:  Chest radiograph performed 08/07/2013  FINDINGS: The patient's left subclavian line is seen ending about the cavoatrial junction.  Lung expansion is improved, though evaluation is suboptimal due to patient rotation. Patchy right-sided airspace opacities raise concern for pneumonia, perhaps slightly more confluent than on the prior study. The left lung appears clear. No pleural effusion or pneumothorax is seen.  The cardiomediastinal silhouette is normal in size. No acute osseous abnormalities are identified.  IMPRESSION: 1. Left subclavian line seen ending about the cavoatrial junction. 2. Patchy right-sided airspace opacities raise concern for pneumonia, perhaps slightly more confluent than on the prior study.   Electronically Signed   By: Garald Balding M.D.   On: 08/08/2013 01:49   Dg Abd Portable 1v  08/08/2013   CLINICAL DATA:  NG tube placement.  EXAM: PORTABLE ABDOMEN - 1 VIEW  COMPARISON:  None.  FINDINGS: An NG tube is identified with tip overlying the proximal-mid stomach.  The bowel gas pattern is unremarkable.  IMPRESSION: NG tube with tip overlying the proximal -mid stomach.   Electronically Signed   By: [name] M.D.   On: 08/08/2013 15:55         Subjective: Pt is more alert. Limited ROS due to mental retardation  Objective: Filed Vitals:   08/12/13 0310 08/12/13 0735 08/12/13 1118 08/12/13 1423  BP: 132/73 123/69 120/69 125/82  Pulse: 73 82 80 83  Temp: 99.1 F (37.3 C) 98.4 F (36.9 C) 98.5 F (36.9 C) 99.7 F (37.6 C)  TempSrc: Oral Axillary Axillary Axillary  Resp: 30 30 20 20   Height:      Weight:   68.5 kg (151 lb 0.2  oz)   SpO2: 94% 93% 94% 96%    Intake/Output Summary (Last 24 hours) at 08/12/13 1754 Last data filed at 08/11/13 2025  Gross per 24 hour  Intake     50 ml  Output      0 ml  Net     50 ml   Weight change:  Exam:   General:  Pt is alert, does not follow commands appropriately, not in acute distress  HEENT: No icterus, No thrush,  [place]/AT  Cardiovascular: RRR, S1/S2, no rubs, no gallops  Respiratory: bilateral scattered crackles without wheeze  Abdomen: Soft/+BS, non tender, non distended, no guarding  Extremities: 2+LE edema, No lymphangitis, No petechiae, No rashes, no synovitis  Data Reviewed: Basic Metabolic Panel:  Recent Labs Lab 08/08/13 0530 08/09/13 0320 08/09/13 0418 08/10/13 0415 08/11/13 0450 08/12/13 0400  NA 144 147  --  146 149* 147  K 3.5* 2.9*  --  3.0* 2.8* 3.2*  CL 107 111  --  114* 112 112  CO2 21 21  --  20 20 21   GLUCOSE 111* 96  --  113* 80 98  BUN 41* 22  --  12 8 6   CREATININE 0.90 0.60  --  0.57 0.54 0.47*  CALCIUM 7.3* 7.5*  --  7.7* 7.7*  7.6*  MG  --   --  2.2  --   --   --    Liver Function Tests:  Recent Labs Lab 08/07/13 1735 08/08/13 0530  AST 18 15  ALT 24 18  ALKPHOS 85 67  BILITOT 0.2* 0.8  PROT 6.1 4.7*  ALBUMIN 2.0* 1.5*   No results found for this basename: LIPASE, AMYLASE,  in the last 168 hours No results found for this basename: AMMONIA,  in the last 168 hours CBC:  Recent Labs Lab 08/07/13 1735  08/08/13 0530 08/08/13 1230 08/09/13 0320 08/10/13 0415 08/11/13 0450 08/12/13 0400  WBC 18.1*  < > 27.7* 20.9* 17.3* 17.9* 12.3* 12.8*  NEUTROABS 15.8*  --  26.3* 19.0*  --   --  9.9*  --   HGB 5.3*  < > 6.5* 7.9* 10.3* 10.9* 11.2* 11.1*  HCT 17.8*  < > 20.7* 23.9* 30.9* 33.8* 34.7* 34.8*  MCV 79.1  < > 85.2 83.6 84.2 83.5 83.8 84.5  PLT 992*  < > 611* 498* 422* 482* 501* 488*  < > = values in this interval not displayed. Cardiac Enzymes: No results found for this basename: CKTOTAL, CKMB, CKMBINDEX,  TROPONINI,  in the last 168 hours BNP: No components found with this basename: POCBNP,  CBG:  Recent Labs Lab 08/11/13 2358 08/12/13 0314 08/12/13 0734 08/12/13 1238 08/12/13 1651  GLUCAP 114* 113* 120* 178* 177*    Recent Results (from the past 240 hour(s))  CULTURE, BLOOD (ROUTINE X 2)     Status: None   Collection Time    08/07/13  7:37 PM      Result Value Ref Range Status   Specimen Description BLOOD LEFT ARM   Final   Special Requests BOTTLES DRAWN AEROBIC AND ANAEROBIC 6ML   Final   Culture  Setup Time     Final   Value: 08/08/2013 14:49     Performed at [facility]   Culture     Final   Value: STAPHYLOCOCCUS SPECIES (COAGULASE NEGATIVE)     Note: THE SIGNIFICANCE OF ISOLATING THIS ORGANISM FROM A SINGLE SET OF BLOOD CULTURES WHEN MULTIPLE SETS ARE DRAWN IS UNCERTAIN. PLEASE NOTIFY THE MICROBIOLOGY DEPARTMENT WITHIN ONE WEEK IF SPECIATION AND SENSITIVITIES ARE REQUIRED.     Note: Gram Stain Report Called to,Read Back By and Verified With: Roderic Ovens RN [id number]     Performed at [facility]   Report Status 08/12/2013 FINAL   Final  CULTURE, BLOOD (ROUTINE X 2)     Status: None   Collection Time    08/07/13  9:45 PM      Result Value Ref Range Status   Specimen Description BLOOD LEFT HAND   Final   Special Requests BOTTLES DRAWN AEROBIC AND ANAEROBIC 5 CC   Final   Culture  Setup Time     Final   Value: 08/08/2013 14:49     Performed at [facility]   Culture     Final   Value:        BLOOD CULTURE RECEIVED NO GROWTH TO DATE CULTURE WILL BE HELD FOR 5 DAYS BEFORE ISSUING A FINAL NEGATIVE REPORT     Performed at [facility]   Report Status PENDING   Incomplete  MRSA PCR SCREENING     Status: None   Collection Time    08/07/13  9:52 PM      Result Value Ref Range Status   MRSA by PCR NEGATIVE  NEGATIVE Final   Comment:            The GeneXpert MRSA Assay (FDA     approved for NASAL specimens     only), is one component of a      comprehensive MRSA colonization     surveillance program. It is not     intended to diagnose MRSA     infection nor to guide or     monitor treatment for     MRSA infections.     Scheduled Meds: . ferumoxytol  510 mg Intravenous Once  . pantoprazole  40 mg Oral BID  . piperacillin-tazobactam (ZOSYN)  IV  3.375 g Intravenous Q8H  . polyethylene glycol  17 g Oral BID  . potassium chloride  40 mEq Oral Q6H  . vancomycin  1,000 mg Intravenous Q12H   Continuous Infusions:    Kelvin Cellar, DO  [facility] Pager [phone]  If 7PM-7AM, please contact night-coverage [email or web address] Password [email or web address] 08/12/2013, 5:54 PM   LOS: 5 days

## 2013-08-12 NOTE — Progress Notes (Signed)
MEDICATION RELATED CONSULT NOTE - INITIAL   Pharmacy Consult for IV iron Indication: iron deficiency anemia  Allergies  Allergen Reactions  . Haldol [Haloperidol Lactate] Palpitations    Sleeps for days    Patient Measurements: Height: 5\' 3"  (160 cm) (estimate given by caregivers) Weight: 151 lb 0.2 oz (68.5 kg) IBW/kg (Calculated) : 56.9  Vital Signs: Temp: 99.7 F (37.6 C) (07/30 1423) Temp src: Axillary (07/30 1423) BP: 125/82 mmHg (07/30 1423) Pulse Rate: 83 (07/30 1423) Intake/Output from previous day: 07/29 0701 - 07/30 0700 In: 250 [IV Piggyback:250] Out: -  Intake/Output from this shift:    Labs:  Recent Labs  08/10/13 0415 08/11/13 0450 08/12/13 0400  WBC 17.9* 12.3* 12.8*  HGB 10.9* 11.2* 11.1*  HCT 33.8* 34.7* 34.8*  PLT 482* 501* 488*  CREATININE 0.57 0.54 0.47*   Estimated Creatinine Clearance: 86.5 ml/min (by C-G formula based on Cr of 0.47).  Assessment: 40 YOM with acute blood loss anemia d/t GIB, s/p 6 units PRBC, iron level was low, per lab on 7/25: Iron < 10 mcg/dl. Pharmacy is consulted to start IV iron supplement. Currently hgb stable at 11.1  Goal of Therapy:  Correct iron deficiency  Plan:  Feraheme 510 mg IV x 1 Plan communicated with Dr. Carlean Purl, will f/u iron level lab after discharge. Pharmacy sign off.  Thanks.  Maryanna Shape, PharmD, BCPS  Clinical Pharmacist  Pager: [phone]   08/12/2013,5:26 PM

## 2013-08-12 NOTE — Evaluation (Signed)
Clinical/Bedside Swallow Evaluation Patient Details  Name: Steven [name] MRN: [id number] Date of Birth: [birth year]  Today's Date: 08/12/2013 Time: 1120-1140 SLP Time Calculation (min): 20 min  Past Medical History:  Past Medical History  Diagnosis Date  . Hyperlipidemia   . Anxiety   . Mental retardation     ue to temp of 108 aftrer having measles age 58.  . Hx of diaper rash   . Edema of foot 02/06/2012    BILATERAL   Past Surgical History:  Past Surgical History  Procedure Laterality Date  . Orif ankle fracture  02/06/2012    RIGHT ANKLE  . Orif ankle fracture  02/06/2012    Procedure: OPEN REDUCTION INTERNAL FIXATION (ORIF) ANKLE FRACTURE;  Surgeon: Wylene Simmer, MD;  Location: [place];  Service: Orthopedics;  Laterality: Right;  . Syndesmosis repair  02/06/2012    Procedure: SYNDESMOSIS REPAIR;  Surgeon: Wylene Simmer, MD;  Location: [place];  Service: Orthopedics;  Laterality: Right;  tri maleolar  . Colonoscopy N/A 08/11/2013    Procedure: COLONOSCOPY;  Surgeon: Gatha Mayer, MD;  Location: [place];  Service: Endoscopy;  Laterality: N/A;  . Esophagogastroduodenoscopy N/A 08/11/2013    Procedure: ESOPHAGOGASTRODUODENOSCOPY (EGD);  Surgeon: Gatha Mayer, MD;  Location: [facility] ENDOSCOPY;  Service: Endoscopy;  Laterality: N/A;   HPI:  58 year old male with a history of mental retardation, hyperlipidemia was taken to urgent care because of poor by mouth intake. Apparently the patient has lost a significant amount of weight over the last few weeks. The patient was transferred to the ED and noted to be hypotensive and have leukocytosis with WBC 18.1.  He was seen by GI, determined to have a bleed and EGD showed large ulcers, suspected to be caused by NSAID.  The patient was started on intravenous antibiotics for sepsis secondary to healthcare associated pneumonia and possible aspiration (right base infiltrate).    Assessment / Plan / Recommendation Clinical Impression  Pt demonstrates  minimal signs of aspiration with PO trials (cough x1 with impulsive intake). Pt otherwise appears normal even with mastication. Given concern for aspiration pna and family report of concern for CVA, will f/u with objective swallow eval. MBS this pm.     Aspiration Risk  Mild    Diet Recommendation Thin liquid;Dysphagia 3 (Mechanical Soft)        Other  Recommendations     Follow Up Recommendations       Frequency and Duration        Pertinent Vitals/Pain NA    SLP Swallow Goals     Swallow Study Prior Functional Status       General HPI: 58 year old male with a history of mental retardation, hyperlipidemia was taken to urgent care because of poor by mouth intake. Apparently the patient has lost a significant amount of weight over the last few weeks. The patient was transferred to the ED and noted to be hypotensive and have leukocytosis with WBC 18.1.  He was seen by GI, determined to have a bleed and EGD showed large ulcers, suspected to be caused by NSAID.  The patient was started on intravenous antibiotics for sepsis secondary to healthcare associated pneumonia and possible aspiration (right base infiltrate).  Type of Study: Bedside swallow evaluation Previous Swallow Assessment: none Diet Prior to this Study:  (soft) Temperature Spikes Noted: No Respiratory Status: Room air History of Recent Intubation: No Behavior/Cognition: Alert;Cooperative;Pleasant mood;Doesn't follow directions Oral Cavity - Dentition: Missing dentition Self-Feeding Abilities: Able to feed self;Needs assist Patient  Positioning: Upright in bed Baseline Vocal Quality: Clear Volitional Cough: Cognitively unable to elicit Volitional Swallow: Unable to elicit    Oral/Motor/Sensory Function Overall Oral Motor/Sensory Function: Other (comment) (does not follow commands, appears symmetrical. )   Ice Chips     Thin Liquid Thin Liquid: Impaired Presentation: Cup;Straw Pharyngeal  Phase Impairments: Cough -  Immediate    Nectar Thick Nectar Thick Liquid: Not tested   Honey Thick Honey Thick Liquid: Not tested   Puree Puree: Within functional limits   Solid   GO    Solid: Within functional limits      [facility], MA CCC-SLP [phone]    Shealynn Saulnier, Katherene Ponto 08/12/2013,2:36 PM

## 2013-08-12 NOTE — Progress Notes (Signed)
Pt transferred from 3S#1 via bed, sisters at bedside, instructed on call light/phone usage and surroundings of room. Pt comfortable per sisters only requesting coffee.

## 2013-08-12 NOTE — Procedures (Signed)
Objective Swallowing Evaluation: Modified Barium Swallowing Study  Patient Details  Name: Steven [name] MRN: [id number] Date of Birth: [birth year]  Today's Date: 08/12/2013 Time: 1305-1340 SLP Time Calculation (min): 35 min  Past Medical History:  Past Medical History  Diagnosis Date  . Hyperlipidemia   . Anxiety   . Mental retardation     ue to temp of 108 aftrer having measles age 58.  . Hx of diaper rash   . Edema of foot 02/06/2012    BILATERAL   Past Surgical History:  Past Surgical History  Procedure Laterality Date  . Orif ankle fracture  02/06/2012    RIGHT ANKLE  . Orif ankle fracture  02/06/2012    Procedure: OPEN REDUCTION INTERNAL FIXATION (ORIF) ANKLE FRACTURE;  Surgeon: Wylene Simmer, MD;  Location: [place];  Service: Orthopedics;  Laterality: Right;  . Syndesmosis repair  02/06/2012    Procedure: SYNDESMOSIS REPAIR;  Surgeon: Wylene Simmer, MD;  Location: [place];  Service: Orthopedics;  Laterality: Right;  tri maleolar  . Colonoscopy N/A 08/11/2013    Procedure: COLONOSCOPY;  Surgeon: Gatha Mayer, MD;  Location: [place];  Service: Endoscopy;  Laterality: N/A;  . Esophagogastroduodenoscopy N/A 08/11/2013    Procedure: ESOPHAGOGASTRODUODENOSCOPY (EGD);  Surgeon: Gatha Mayer, MD;  Location: [facility] ENDOSCOPY;  Service: Endoscopy;  Laterality: N/A;   HPI:  58 year old male with a history of mental retardation, hyperlipidemia was taken to urgent care because of poor by mouth intake. Apparently the patient has lost a significant amount of weight over the last few weeks. The patient was transferred to the ED and noted to be hypotensive and have leukocytosis with WBC 18.1.  He was seen by GI, determined to have a bleed and EGD showed large ulcers, suspected to be caused by NSAID.  The patient was started on intravenous antibiotics for sepsis secondary to healthcare associated pneumonia and possible aspiration (right base infiltrate).      Assessment / Plan /  Recommendation Clinical Impression  Dysphagia Diagnosis: Mild oral phase dysphagia;Mild pharyngeal phase dysphagia Clinical impression: Pt presents with a mild oral dysphagia with tongue pumping, anterior mastication due to missing posterior dentition. Is able to manage soft solids well. Pt does have a delayed swallow response, though with consecutive boluses pt is able to maintain hyolaryngeal elevation and airway protection. There was one instance of trace penetration above the cords with mild pyriform sinuses residuals present post swallow with large sips. At this time, aspiration risk appears quite low. Pt is recommended to consume mechanical soft diet with thin liquids with normal PO intake habits (self feeding though somewhat impulsive). If family notices possible signs of aspiration, recommend full supervision with POs for small single sips, restricting straws. No SLP f/u needed at this time.     Treatment Recommendation  No treatment recommended at this time    Diet Recommendation Dysphagia 3 (Mechanical Soft);Thin liquid   Liquid Administration via: Cup;Straw Medication Administration: Whole meds with puree Supervision: Staff to assist with self feeding Compensations: Slow rate;Small sips/bites Postural Changes and/or Swallow Maneuvers: Seated upright 90 degrees    Other  Recommendations Oral Care Recommendations: Oral care BID   Follow Up Recommendations  Other (comment) (group home)    Frequency and Duration        Pertinent Vitals/Pain NA    SLP Swallow Goals     General HPI: 58 year old male with a history of mental retardation, hyperlipidemia was taken to urgent care because of poor by mouth intake. Apparently the  patient has lost a significant amount of weight over the last few weeks. The patient was transferred to the ED and noted to be hypotensive and have leukocytosis with WBC 18.1.  He was seen by GI, determined to have a bleed and EGD showed large ulcers, suspected  to be caused by NSAID.  The patient was started on intravenous antibiotics for sepsis secondary to healthcare associated pneumonia and possible aspiration (right base infiltrate).  Type of Study: Modified Barium Swallowing Study Reason for Referral: Objectively evaluate swallowing function Previous Swallow Assessment: none Diet Prior to this Study: Other (Comment) (SOFT) Temperature Spikes Noted: No Respiratory Status: Room air History of Recent Intubation: No Behavior/Cognition: Alert;Cooperative;Pleasant mood;Doesn't follow directions Oral Cavity - Dentition: Missing dentition Oral Motor / Sensory Function: Within functional limits Self-Feeding Abilities: Able to feed self;Needs assist Patient Positioning: Upright in chair Baseline Vocal Quality: Clear Volitional Cough: Cognitively unable to elicit Volitional Swallow: Unable to elicit Anatomy: Within functional limits Pharyngeal Secretions: Not observed secondary MBS    Reason for Referral Objectively evaluate swallowing function   Oral Phase Oral Preparation/Oral Phase Oral Phase: Impaired Oral - Thin Oral - Thin Cup: Lingual pumping Oral - Thin Straw: Lingual pumping Oral - Solids Oral - Puree: Lingual pumping Oral - Mechanical Soft: Impaired mastication (only anterior dentition)   Pharyngeal Phase Pharyngeal Phase Pharyngeal Phase: Impaired Pharyngeal - Thin Pharyngeal - Thin Cup: Delayed swallow initiation;Pharyngeal residue - pyriform sinuses Pharyngeal - Thin Straw: Delayed swallow initiation;Pharyngeal residue - pyriform sinuses;Penetration/Aspiration after swallow Penetration/Aspiration details (thin straw): Material enters airway, remains ABOVE vocal cords then ejected out Pharyngeal - Solids Pharyngeal - Puree: Delayed swallow initiation Pharyngeal - Mechanical Soft: Delayed swallow initiation  Cervical Esophageal Phase    GO    Cervical Esophageal Phase Cervical Esophageal Phase: WFL (appearance of possible  hiatal hernia, no radiologist present)        Herbie Baltimore, MA CCC-SLP [phone]  Kaitlynd Phillips, Katherene Ponto 08/12/2013, 2:57 PM

## 2013-08-13 ENCOUNTER — Inpatient Hospital Stay (HOSPITAL_COMMUNITY): Payer: Medicare Other

## 2013-08-13 DIAGNOSIS — E876 Hypokalemia: Secondary | ICD-10-CM

## 2013-08-13 LAB — BASIC METABOLIC PANEL
Anion gap: 12 (ref 5–15)
BUN: 4 mg/dL — ABNORMAL LOW (ref 6–23)
CHLORIDE: 106 meq/L (ref 96–112)
CO2: 23 mEq/L (ref 19–32)
Calcium: 7.8 mg/dL — ABNORMAL LOW (ref 8.4–10.5)
Creatinine, Ser: 0.4 mg/dL — ABNORMAL LOW (ref 0.50–1.35)
GFR calc non Af Amer: >90 mL/min (ref >90)
Glucose, Bld: 90 mg/dL (ref 70–99)
Potassium: 3.9 mEq/L (ref 3.7–5.3)
Sodium: 141 mEq/L (ref 137–147)

## 2013-08-13 LAB — GLUCOSE, CAPILLARY
GLUCOSE-CAPILLARY: 101 mg/dL — AB (ref 70–99)
Glucose-Capillary: 83 mg/dL (ref 70–99)
Glucose-Capillary: 82 mg/dL (ref 70–99)
Glucose-Capillary: 101 mg/dL — ABNORMAL HIGH (ref 70–99)

## 2013-08-13 MED ORDER — WHITE PETROLATUM GEL
Status: AC
  Filled 2013-08-13: qty 5

## 2013-08-13 MED ORDER — SODIUM CHLORIDE 0.9 % IJ SOLN: 10.0000 mL | INTRAMUSCULAR | Status: DC | PRN

## 2013-08-13 NOTE — Clinical Social Work Note (Signed)
CSW was informed that patient may require SNF placement. CSW contacted patient's sister and the Ivin Booty with facility who states that the group home is arranged to have a lift brought into the group home so the patient will be able to return once this is in place. CSW has updated RNCM. Will leave report for weekend CSW.   Liz Beach MSW, [place], [place], [phone]

## 2013-08-13 NOTE — Progress Notes (Signed)
PROGRESS NOTE  Steven [name] WNI:[id number] DOB: 1955/11/13 DOA: 08/07/2013 PCP: [name], MD   Interim history  58 year old male with a history of mental retardation, hyperlipidemia was taken to urgent care because of poor by mouth intake. Apparently the patient has lost a significant amount of weight over the last few weeks. The patient was transferred to the ED and noted to be hypotensive and have leukocytosis with WBC 18.1. His hemoglobin was 5.3 at the time of presentation. With intravenous fluids, his hemoglobin dropped to 3.9. Stool guaiac was positive in the emergency department, and he was provided for history of the patient is on chronic diclofenac for chronic pain. Dr. Benson Norway was consulted and plans for EGD and colonscopy. The patient has received 6 units PRBCs total. The patient was started on intravenous antibiotics for sepsis secondary to healthcare associated pneumonia and possible aspiration. Multiple discussions were held with the family which have confirmed his DO NOT RESUSCITATE status.  Assessment/Plan:  Acute blood loss anemia  -Secondary to GI bleed  -received 6 units PRBCs total  -Am labs showing Hgb of 11.1 -Colonoscopy performed on 08/11/2013 showing normal colon, EGD showed large ulcer in the prepyloric region as well as multiple non-bleeding ulcers in the duodenal bulb -NSAID therapy likely cause -GI recommending PPI BID -Hg stable Sepsis  -SBP now stablized in upper 90s, low 100s -secondary to pneumonia in pt with tachycardia, hypotension, elevated WBC  -Transitioned to Augmentin Pneumonia  -Concerned about aspiration, with CXR done on 08/10/2013 showing persistent bibasilar changes slightly worse on right -Patient remaining afebrile, will stop IV Zosyn and start Augmentin Suspected Aspiration -Patient with a history of MR, having right sided infiltrate on CXR -Speech Path was consulted who recommended Dysphagia 3 with thin liquid Acute  Hypoxemic Respiratory Failure -CXR done on 08/10/2013 showing persistent bibasilar changes slightly worse on right -Resolving Hypokalemia -AM labs showing improved  potassium level of 3.2 -Repeat Potassium 3.9 Acute kidney injury  -Resolved Lower extremity edema  -Venous duplex--neg  Thrombocytosis  -Secondary to iron deficiency anemia  -iron sat <10% -start iron when able to take po Family Communication: sisters updated at bedside Disposition Plan: PT recommending SNF placement. I spoke with sister and caregiver from the group home, patient may be a better fit for SNF rather than Group Home. SW consulted.   Antibiotics:  Vancomycin 08/08/13>>>08/10/13  Zosyn 08/08/13>>>08/12/2013  Augmentin 08/12/2013   Procedures/Studies: Dg Chest 2 View  08/07/2013   CLINICAL DATA:  Rule out infection  EXAM: CHEST  2 VIEW  COMPARISON:  01/27/2012.  FINDINGS: Low volume film with airspace disease in the right mid lung compatible with pneumonia. Left lung is clear. Left upper lobe nodular density seen on the previous study is not evident on today's exam. The cardio pericardial silhouette is enlarged. No edema or pleural effusion. Bones are diffusely demineralized.  IMPRESSION: Right midlung airspace disease, suspicious for pneumonia.   Electronically Signed   By: Misty Stanley M.D.   On: 08/07/2013 21:04   Dg Chest Port 1 View  08/09/2013   CLINICAL DATA:  Decreased O2 saturation.  EXAM: PORTABLE CHEST - 1 VIEW 01:03 a.m.  COMPARISON:  08/08/2013 and 08/07/2013  FINDINGS: The patient has developed and increased infiltrate at the right lung base with new atelectasis and an infiltrate in the left perihilar region as well as new small right pleural effusion. I suspect the findings represent pulmonary edema.  NG tube tip is below  the diaphragm. Central venous catheter tip is at the cavoatrial junction.  IMPRESSION: Progressive infiltrate at the right base with new small right effusion.  New atelectasis in faint  infiltrate in the left. I suspect the findings represent pulmonary edema.   Electronically Signed   By: [name] M.D.   On: 08/09/2013 01:26   Dg Chest Port 1 View  08/08/2013   CLINICAL DATA:  Central line placement.  EXAM: PORTABLE CHEST - 1 VIEW  COMPARISON:  Chest radiograph performed 08/07/2013  FINDINGS: The patient's left subclavian line is seen ending about the cavoatrial junction.  Lung expansion is improved, though evaluation is suboptimal due to patient rotation. Patchy right-sided airspace opacities raise concern for pneumonia, perhaps slightly more confluent than on the prior study. The left lung appears clear. No pleural effusion or pneumothorax is seen.  The cardiomediastinal silhouette is normal in size. No acute osseous abnormalities are identified.  IMPRESSION: 1. Left subclavian line seen ending about the cavoatrial junction. 2. Patchy right-sided airspace opacities raise concern for pneumonia, perhaps slightly more confluent than on the prior study.   Electronically Signed   By: Garald Balding M.D.   On: 08/08/2013 01:49   Dg Abd Portable 1v  08/08/2013   CLINICAL DATA:  NG tube placement.  EXAM: PORTABLE ABDOMEN - 1 VIEW  COMPARISON:  None.  FINDINGS: An NG tube is identified with tip overlying the proximal-mid stomach.  The bowel gas pattern is unremarkable.  IMPRESSION: NG tube with tip overlying the proximal -mid stomach.   Electronically Signed   By: [name] M.D.   On: 08/08/2013 15:55         Subjective: Pt is more alert. Limited ROS due to mental retardation  Objective: Filed Vitals:   08/12/13 1423 08/12/13 2048 08/12/13 2206 08/13/13 0515  BP: 125/82 136/88  129/82  Pulse: 83 101  84  Temp: 99.7 F (37.6 C) 100.8 F (38.2 C) 98.5 F (36.9 C) 98.3 F (36.8 C)  TempSrc: Axillary Axillary Axillary Oral  Resp: 20 20  18   Height:      Weight:      SpO2: 96% 96%  94%    Intake/Output Summary (Last 24 hours) at 08/13/13 1329 Last data filed at 08/13/13  1000  Gross per 24 hour  Intake    770 ml  Output      0 ml  Net    770 ml   Weight change:  Exam:   General:  Pt is alert, does not follow commands appropriately, sitting up eating his lunch  HEENT: No icterus, No thrush,  [place]/AT  Cardiovascular: RRR, S1/S2, no rubs, no gallops  Respiratory: bilateral scattered crackles without wheeze, normal inspiratory effort  Abdomen: Soft/+BS, non tender, non distended, no guarding  Extremities: 1+LE edema, No lymphangitis, No petechiae, No rashes, no synovitis  Data Reviewed: Basic Metabolic Panel:  Recent Labs Lab 08/09/13 0320 08/09/13 0418 08/10/13 0415 08/11/13 0450 08/12/13 0400 08/13/13 0828  NA 147  --  146 149* 147 141  K 2.9*  --  3.0* 2.8* 3.2* 3.9  CL 111  --  114* 112 112 106  CO2 21  --  20 20 21 23   GLUCOSE 96  --  113* 80 98 90  BUN 22  --  12 8 6  4*  CREATININE 0.60  --  0.57 0.54 0.47* 0.40*  CALCIUM 7.5*  --  7.7* 7.7* 7.6* 7.8*  MG  --  2.2  --   --   --   --  Liver Function Tests:  Recent Labs Lab 08/07/13 1735 08/08/13 0530  AST 18 15  ALT 24 18  ALKPHOS 85 67  BILITOT 0.2* 0.8  PROT 6.1 4.7*  ALBUMIN 2.0* 1.5*   No results found for this basename: LIPASE, AMYLASE,  in the last 168 hours No results found for this basename: AMMONIA,  in the last 168 hours CBC:  Recent Labs Lab 08/07/13 1735  08/08/13 0530 08/08/13 1230 08/09/13 0320 08/10/13 0415 08/11/13 0450 08/12/13 0400  WBC 18.1*  < > 27.7* 20.9* 17.3* 17.9* 12.3* 12.8*  NEUTROABS 15.8*  --  26.3* 19.0*  --   --  9.9*  --   HGB 5.3*  < > 6.5* 7.9* 10.3* 10.9* 11.2* 11.1*  HCT 17.8*  < > 20.7* 23.9* 30.9* 33.8* 34.7* 34.8*  MCV 79.1  < > 85.2 83.6 84.2 83.5 83.8 84.5  PLT 992*  < > 611* 498* 422* 482* 501* 488*  < > = values in this interval not displayed. Cardiac Enzymes: No results found for this basename: CKTOTAL, CKMB, CKMBINDEX, TROPONINI,  in the last 168 hours BNP: No components found with this basename: POCBNP,    CBG:  Recent Labs Lab 08/12/13 1238 08/12/13 1651 08/12/13 2108 08/13/13 0759 08/13/13 1216  GLUCAP 178* 177* 187* 83 82    Recent Results (from the past 240 hour(s))  CULTURE, BLOOD (ROUTINE X 2)     Status: None   Collection Time    08/07/13  7:37 PM      Result Value Ref Range Status   Specimen Description BLOOD LEFT ARM   Final   Special Requests BOTTLES DRAWN AEROBIC AND ANAEROBIC 6ML   Final   Culture  Setup Time     Final   Value: 08/08/2013 14:49     Performed at [facility]   Culture     Final   Value: STAPHYLOCOCCUS SPECIES (COAGULASE NEGATIVE)     Note: THE SIGNIFICANCE OF ISOLATING THIS ORGANISM FROM A SINGLE SET OF BLOOD CULTURES WHEN MULTIPLE SETS ARE DRAWN IS UNCERTAIN. PLEASE NOTIFY THE MICROBIOLOGY DEPARTMENT WITHIN ONE WEEK IF SPECIATION AND SENSITIVITIES ARE REQUIRED.     Note: Gram Stain Report Called to,Read Back By and Verified With: Roderic Ovens RN [id number]     Performed at [facility]   Report Status 08/12/2013 FINAL   Final  CULTURE, BLOOD (ROUTINE X 2)     Status: None   Collection Time    08/07/13  9:45 PM      Result Value Ref Range Status   Specimen Description BLOOD LEFT HAND   Final   Special Requests BOTTLES DRAWN AEROBIC AND ANAEROBIC 5 CC   Final   Culture  Setup Time     Final   Value: 08/08/2013 14:49     Performed at [facility]   Culture     Final   Value:        BLOOD CULTURE RECEIVED NO GROWTH TO DATE CULTURE WILL BE HELD FOR 5 DAYS BEFORE ISSUING A FINAL NEGATIVE REPORT     Performed at [facility]   Report Status PENDING   Incomplete  MRSA PCR SCREENING     Status: None   Collection Time    08/07/13  9:52 PM      Result Value Ref Range Status   MRSA by PCR NEGATIVE  NEGATIVE Final   Comment:            The GeneXpert MRSA Assay (  FDA     approved for NASAL specimens     only), is one component of a     comprehensive MRSA colonization     surveillance program. It is not     intended to diagnose  MRSA     infection nor to guide or     monitor treatment for     MRSA infections.     Scheduled Meds: . amoxicillin-clavulanate  1 tablet Oral Q12H  . pantoprazole  40 mg Oral BID  . polyethylene glycol  17 g Oral BID   Continuous Infusions:    Kelvin Cellar, DO  [facility] Pager [phone]  If 7PM-7AM, please contact night-coverage [email or web address] Password [email or web address] 08/13/2013, 1:29 PM   LOS: 6 days

## 2013-08-13 NOTE — Evaluation (Signed)
Physical Therapy Evaluation Patient Details Name: Steven [name] MRN: [id number] DOB: 02/11/1955 Today's Date: 08/13/2013   History of Present Illness  58 y/o male with history of mental retardation admitted with GI bleed.  Clinical Impression  Pt admitted with GI bleed. Pt currently with functional limitations due to the deficits listed below (see PT Problem List). Pt will benefit from skilled PT to increase their independence and safety with mobility to allow discharge to the venue listed below. Recommend SNF unless group home can provide level of A that pt requires.  Pt currently TOT A of 2 with scoot transfer by rehab staff from bed to chair and recommended lift for nursing staff.  PT called group home but was unable to reach anyone regarding what level of A they can provide.  Also of note, when PT came into room, pt's breakfast tray was spilled all over the end of the bed and oatmeal on the floor.  Sister, via phone, states pt is able to feed self.  Another tray was ordered to see if pt was able to do better with eating while sitting up.     Follow Up Recommendations SNF;Home health PT (If group can provide +2 TOT A for transfers then HHPT, if not then SNF)    Equipment Recommendations  None recommended by PT    Recommendations for Other Services       Precautions / Restrictions Precautions Precautions: Fall Restrictions Weight Bearing Restrictions: No      Mobility  Bed Mobility Overal bed mobility: +2 for physical assistance;Needs Assistance Bed Mobility: Supine to Sit     Supine to sit: +2 for physical assistance;Total assist     General bed mobility comments: Use of pads to get pt's hips turned and to EOB.  Transfers Overall transfer level: Needs assistance   Transfers: Lateral/Scoot Transfers          Lateral/Scoot Transfers: +2 physical assistance;Total assist General transfer comment: Used drop arm recliner for scoot transfer and pt given cues to use UE to  A but pt did not appear to give any effort in transfer.   Ambulation/Gait                Stairs            Wheelchair Mobility    Modified Rankin (Stroke Patients Only)       Balance Overall balance assessment: Needs assistance Sitting-balance support: Bilateral upper extremity supported Sitting balance-Leahy Scale: Poor       Standing balance-Leahy Scale: Zero                               Pertinent Vitals/Pain Denies pain    Home Living Family/patient expects to be discharged to:: Skilled nursing facility                      Prior Function Level of Independence: Needs assistance   Gait / Transfers Assistance Needed: Pt w/c bound but up until he got sick was able to A in standing and SPT with1-2 people depending on the day per his sister.           Hand Dominance        Extremity/Trunk Assessment   Upper Extremity Assessment: Defer to OT evaluation           Lower Extremity Assessment: LLE deficits/detail;RLE deficits/detail RLE Deficits / Details: ankle plantarflexion and knee contracture  LLE  Deficits / Details: ankle contracture     Communication      Cognition Arousal/Alertness: Awake/alert Behavior During Therapy: WFL for tasks assessed/performed Overall Cognitive Status: History of cognitive impairments - at baseline                      General Comments General comments (skin integrity, edema, etc.): Wound on L heel with bandage present.    Exercises        Assessment/Plan    PT Assessment Patient needs continued PT services  PT Diagnosis     PT Problem List Decreased strength;Decreased range of motion;Decreased balance;Decreased mobility  PT Treatment Interventions Functional mobility training;Therapeutic exercise;Therapeutic activities   PT Goals (Current goals can be found in the Care Plan section) Acute Rehab PT Goals Patient Stated Goal: Get stronger PT Goal Formulation: With  family Time For Goal Achievement: 08/20/13 Potential to Achieve Goals: Fair    Frequency Min 3X/week   Barriers to discharge        Co-evaluation               End of Session Equipment Utilized During Treatment: Gait belt Activity Tolerance: Patient tolerated treatment well Patient left: in chair;with chair alarm set Nurse Communication: Mobility status;Need for lift equipment (spoke with NT re: transfers and lift)         Time: 4332-9518 PT Time Calculation (min): 29 min   Charges:   PT Evaluation $Initial PT Evaluation Tier I: 1 Procedure PT Treatments $Therapeutic Activity: 23-37 mins   PT G Codes:          Steven [name] 08/13/2013, 11:34 AM

## 2013-08-13 NOTE — Evaluation (Signed)
Occupational Therapy Evaluation Patient Details Name: Steven [name] MRN: [id number] DOB: [birth year] Today's Date: 08/13/2013    History of Present Illness 58 y/o male with history of mental retardation admitted with GI bleed.   Clinical Impression   This pt was admitted for the above.  PLOF is unknown; he resides at a group home.  Pt participated with grooming task, but all ADLs are at total A level, A x 2 needed for LB.  Pt may need STSNF unless Group Home can manage him at this level.      Follow Up Recommendations  SNF (unless group home can manage him at total A level; A x 2 for mobility)   Equipment Recommendations  None recommended by OT    Recommendations for Other Services       Precautions / Restrictions Precautions Precautions: Fall      Mobility Bed Mobility              Transfers performed by PT: Overall transfer level: Needs assistance   Transfers: Lateral/Scoot Transfers          Lateral/Scoot Transfers: +2 physical assistance;Total assist General transfer comment: Used drop arm recliner for scoot transfer and pt given cues to use UE to A but pt did not appear to give any effort in transfer.     Balance Overall balance assessment: Needs assistance Sitting-balance support: Bilateral upper extremity supported Sitting balance-Leahy Scale: Poor                                     ADL Overall ADL's : Needs assistance/impaired                                       General ADL Comments: uncertain of baseline:  pt participated with washing face and brushing teeth but was still total A (pt 10% teeth and 20% face).  pt needed total A x 2 to transfer with PT.  Did not attempt to stand with A x 1; did not assist with leaning forward away from back of chair     Vision                     Perception     Praxis      Pertinent Vitals/Pain No signs of pain.     Hand Dominance Right    Extremity/Trunk Assessment Upper Extremity Assessment Upper Extremity Assessment: Difficult to assess due to impaired cognition  Appears to have generalized weakness          Communication Communication Communication:  (very little verbalization.  Asked for "Rick".  nods/shakes ) smiles at times   Cognition Arousal/Alertness: Awake/alert Behavior During Therapy: WFL for tasks assessed/performed Overall Cognitive Status: History of cognitive impairments - at baseline                     General Comments       Exercises       Shoulder Instructions      Home Living Family/patient expects to be discharged to:: Skilled nursing facility                                        Prior Functioning/Environment  Level of Independence: Needs assistance  Gait / Transfers Assistance Needed: Pt w/c bound but up until he got sick was able to A in standing and SPT with1-2 people depending on the day per his sister.          OT Diagnosis: Generalized weakness   OT Problem List: Decreased strength;Decreased activity tolerance;Impaired balance (sitting and/or standing);Decreased cognition   OT Treatment/Interventions:      OT Goals(Current goals can be found in the care plan section) Acute Rehab OT Goals Patient Stated Goal: Get stronger  OT Frequency:     Barriers to D/C:            Co-evaluation              End of Session Nurse Communication:  (pt rubbed stomach; wanted something to eat)  Activity Tolerance: Patient tolerated treatment well Patient left: in chair;with call bell/phone within reach   Time: 1054-1108 OT Time Calculation (min): 14 min Charges:  OT General Charges $OT Visit: 1 Procedure OT Evaluation $Initial OT Evaluation Tier I: 1 Procedure OT Treatments $Self Care/Home Management : 8-22 mins G-Codes:    Danzig Macgregor 08-23-13, 12:33 PM Lesle Chris, OTR/L [phone] Aug 23, 2013

## 2013-08-14 LAB — CULTURE, BLOOD (ROUTINE X 2): CULTURE: NO GROWTH

## 2013-08-14 LAB — GLUCOSE, CAPILLARY: GLUCOSE-CAPILLARY: 107 mg/dL — AB (ref 70–99)

## 2013-08-14 MED ORDER — AMOXICILLIN-POT CLAVULANATE 875-125 MG PO TABS: 1.0000 | ORAL_TABLET | Freq: Two times a day (BID) | ORAL | Status: DC

## 2013-08-14 MED ORDER — ACETAMINOPHEN 325 MG PO TABS
650.0000 mg | ORAL_TABLET | Freq: Four times a day (QID) | ORAL | Status: AC | PRN
Start: 2013-08-14 — End: ?

## 2013-08-14 MED ORDER — PANTOPRAZOLE SODIUM 40 MG PO TBEC: 40.0000 mg | DELAYED_RELEASE_TABLET | Freq: Two times a day (BID) | ORAL | Status: DC

## 2013-08-14 MED ORDER — POLYETHYLENE GLYCOL 3350 17 G PO PACK: 17.0000 g | PACK | Freq: Two times a day (BID) | ORAL | Status: DC

## 2013-08-14 NOTE — Progress Notes (Signed)
Pt prepared for d/c to Group Home. Central line disconnected. Skin intact except as most recently charted. Vitals are stable. Report called to receiving facility. Pt to be transported by ambulance service.

## 2013-08-14 NOTE — ED Provider Notes (Signed)
I saw and evaluated the patient, reviewed the resident's note and I agree with the findings and plan.   EKG Interpretation None      Patient seen and evaluated. Describes GI bleeding. Has symptomatic anemia.  Agree with transfusion and admission.  Tanna Furry, MD 08/14/13 2003

## 2013-08-14 NOTE — Discharge Planning (Addendum)
Pt will be dc'd back to [facility]  RN to: -coordinate transportation with group home administrator, Ivin Booty [phone] or [phone] -provide group home administrator with dc summary at time of dc. -contact family to inform of transfer  RN contacted CSW and stated agreeable with the above.  CSW remains available for assistance with this transfer.  Nonnie Done, [place] [phone] Clinical Social Work

## 2013-08-14 NOTE — Discharge Summary (Signed)
Physician Discharge Summary  Steven [name] GQQ:[id number] DOB: [birth year] DOA: 08/07/2013  PCP: [name], MD  Admit date: 08/07/2013 Discharge date: 08/14/2013  Time spent: 35 minutes  Recommendations for Outpatient Follow-up:  1. Please follow up on a CBC/BMP in 1 week 2. Continue antibiotic therapy with Augment 875 for 4 more days  Discharge Diagnoses:  Active Problems:   GI bleed   Community acquired pneumonia   Leucocytosis   Thrombocytosis   Anemia   Acute blood loss anemia   Sepsis   HCAP (healthcare-associated pneumonia)   Protein-calorie malnutrition, severe   Acute respiratory failure   Iron deficiency anemia, unspecified   Nonspecific abnormal finding in stool contents   Gastric ulcer, chronic   Multiple duodenal ulcers   Discharge Condition: Stable  Diet recommendation: Dysphagia 3 (Mechanical Soft with Thin Liquid) as recommended by Speech/Path  Filed Weights   08/07/13 2200 08/12/13 1118  Weight: 60.5 kg (133 lb 6.1 oz) 68.5 kg (151 lb 0.2 oz)    History of present illness:  Steven [name] is a 58 y.o. male with history of mental retardation and hyperlipidemia was urgently brought to the urgent care Center because patient was having poor appetite and not eating well. As per the patient's caregiver and patient's sister with whom I spoke patient has been losing weight over the last few weeks and has been hardly eating anything. Patient was referred to the ER and in the ER patient was initially found to be mildly hypertensive and blood work showed leukocytosis with thrombocytosis and severe anemia. Stool for blood was positive though not melanotic as per the ER physician. Patient is on diclofenac for chronic pain. Patient was given fluid bolus following which at this time PRBC transfusion has been arranged. I have discussed with on-call gastroenterologist Dr. Benson Norway who will be seeing patient in consult. Patient is not very communicative and does not provide  much history. As per the caregiver and patient's sister patient has not had any nausea vomiting abdominal pain diarrhea chest pain cough fever chills. Patient chest x-ray shows possible pneumonia.  Hospital Course:  58 year old male with a history of mental retardation, hyperlipidemia was taken to urgent care because of poor by mouth intake. Apparently the patient has lost a significant amount of weight over the last few weeks. The patient was transferred to the ED and noted to be hypotensive and have leukocytosis with WBC 18.1. His hemoglobin was 5.3 at the time of presentation. With intravenous fluids, his hemoglobin dropped to 3.9. Stool guaiac was positive in the emergency department, and he was provided for history of the patient is on chronic diclofenac for chronic pain. Dr. Benson Norway was consulted and plans for EGD and colonscopy. The patient has received 6 units PRBCs total. The patient was started on intravenous antibiotics for sepsis secondary to healthcare associated pneumonia and possible aspiration. Multiple discussions were held with the family which have confirmed his DO NOT RESUSCITATE status. On 08/11/2013 he underwent upper endoscopy, procedure performed by Dr. Silvano Rusk that showed a large ulcer in the prepyloric region of the stomach as well as multiple small medium sized nonbleeding ulcers found in the duodenal bulb. Rest of the endoscopy was otherwise unremarkable. It was felt NSAID therapy with diclofenac was the probable cause of his ulcers. GI recommending the discontinuation of all NSAIDs and placing him on Protonix 40 mg by mouth twice daily. Colonoscopy performed on the same date showed a normal-appearing cecum, it was cecal valve, appendiceal orifice. The ascending,  hepatic flexure, transverse, splenic flexure, sigmoid colon and rectum appear unremarkable. Patient showed gradual improvement as his hemoglobin remained stable in the 11 range. Other issues addressed during this  hospitalization included the development of healthcare associated pneumonia versus aspiration pneumonia. Chest x-ray done on 08/10/2013 showing persistent bibasilar changes slightly worse on the right. He did undergo a speech evaluation, as any dysphasia 3 (mechanical soft diet) was recommended. Initially he had been treated with Zosyn that was transitioned to oral Augmentin.  Physical therapy had recommended skilled nursing facility placement for rehabilitation. Discussion held with family members to express wishes for him to return back to his group home feeling that he would do better in his familiar setting. Family expressing concerns for SNF placement as this unfamiliar environment could be traumatic for him or lead to clinical deterioration. Social services consulted, will your lift to be delivered to his group home to assist with transfers. Home health services were arranged as patient will receive physical therapy at his group home. He was discharged in stable condition on 08/14/2013.   Procedures:  Colonoscopy performed on 08/11/2013 Impression: Normal colon   EGD performed on 08/11/2013 Impression: 1. Large ulcer was found in the prepyloric region of the stomach  2. Multiple small medium sized non-bleeding ulcers were found in  the duodenal bulb  3. The remainder of the upper endoscopy exam was otherwise normal  Consultations:  GI  Physical therapy  Social work  Discharge Exam: Filed Vitals:   08/14/13 0500  BP: 135/77  Pulse: 83  Temp: 98.6 F (37 C)  Resp: 18    General: Pt is alert, does not follow commands appropriately, not in acute distress  HEENT: No icterus, No thrush, [place]/AT  Cardiovascular: RRR, S1/S2, no rubs, no gallops  Respiratory: bilateral scattered crackles without wheeze  Abdomen: Soft/+BS, non tender, non distended, no guarding  Extremities: 2+LE edema, No lymphangitis, No petechiae, No rashes, no synovitis   Discharge Instructions You were  cared for by a hospitalist during your hospital stay. If you have any questions about your discharge medications or the care you received while you were in the hospital after you are discharged, you can call the unit and asked to speak with the hospitalist on call if the hospitalist that took care of you is not available. Once you are discharged, your primary care physician will handle any further medical issues. Please note that NO REFILLS for any discharge medications will be authorized once you are discharged, as it is imperative that you return to your primary care physician (or establish a relationship with a primary care physician if you do not have one) for your aftercare needs so that they can reassess your need for medications and monitor your lab values.  Discharge Instructions   Call MD for:  difficulty breathing, headache or visual disturbances    Complete by:  As directed      Call MD for:  extreme fatigue    Complete by:  As directed      Call MD for:  persistant dizziness or light-headedness    Complete by:  As directed      Call MD for:  persistant nausea and vomiting    Complete by:  As directed      Call MD for:  redness, tenderness, or signs of infection (pain, swelling, redness, odor or green/yellow discharge around incision site)    Complete by:  As directed      Diet - low sodium heart healthy  Complete by:  As directed      Increase activity slowly    Complete by:  As directed             Medication List    STOP taking these medications       aspirin 325 MG EC tablet     diclofenac 75 MG EC tablet  Commonly known as:  VOLTAREN     furosemide 40 MG tablet  Commonly known as:  LASIX      TAKE these medications       acetaminophen 325 MG tablet  Commonly known as:  TYLENOL  Take 2 tablets (650 mg total) by mouth every 6 (six) hours as needed for mild pain (or Fever >/= 101).     amoxicillin-clavulanate 875-125 MG per tablet  Commonly known as:  AUGMENTIN   Take 1 tablet by mouth every 12 (twelve) hours.     atorvastatin 10 MG tablet  Commonly known as:  LIPITOR  Take 10 mg by mouth daily.     chlorhexidine 0.12 % solution  Commonly known as:  PERIDEX  Use as directed 10 mLs in the mouth or throat 3 (three) times daily. Apply to teeth and gums with toothbrush (staff hand over hand)     cholecalciferol 1000 UNITS tablet  Commonly known as:  VITAMIN D  Take 2,000 Units by mouth daily.     pantoprazole 40 MG tablet  Commonly known as:  PROTONIX  Take 1 tablet (40 mg total) by mouth 2 (two) times daily.     polyethylene glycol packet  Commonly known as:  MIRALAX / GLYCOLAX  Take 17 g by mouth 2 (two) times daily.     psyllium 58.6 % powder  Commonly known as:  METAMUCIL  Take 1 packet by mouth daily.       Allergies  Allergen Reactions  . Haldol [Haloperidol Lactate] Palpitations    Sleeps for days       Follow-up Information   Follow up with BADGER,MICHAEL C, MD In 1 week.   Specialty:  Family Medicine   Contact information:   [place] [address] [phone]       Follow up with Silvano Rusk, MD In 2 weeks.   Specialty:  Gastroenterology   Contact information:   [address] [place] [address] [phone]        The results of significant diagnostics from this hospitalization (including imaging, microbiology, ancillary and laboratory) are listed below for reference.    Significant Diagnostic Studies: Dg Chest 2 View  08/07/2013   CLINICAL DATA:  Rule out infection  EXAM: CHEST  2 VIEW  COMPARISON:  01/27/2012.  FINDINGS: Low volume film with airspace disease in the right mid lung compatible with pneumonia. Left lung is clear. Left upper lobe nodular density seen on the previous study is not evident on today's exam. The cardio pericardial silhouette is enlarged. No edema or pleural effusion. Bones are diffusely demineralized.  IMPRESSION: Right midlung airspace disease, suspicious  for pneumonia.   Electronically Signed   By: Misty Stanley M.D.   On: 08/07/2013 21:04   Dg Ankle 2 Views Left  08/13/2013   CLINICAL DATA:  Left ankle pain  EXAM: LEFT ANKLE - 2 VIEW  COMPARISON:  None  FINDINGS: Three views of the left ankle submitted. No acute fracture or subluxation. No radiopaque foreign body.  IMPRESSION: Negative.   Electronically Signed   By: Orlean Bradford.D.  On: 08/13/2013 16:09   Dg Chest Port 1 View  08/10/2013   CLINICAL DATA:  Dyspnea  EXAM: PORTABLE CHEST - 1 VIEW  COMPARISON:  08/09/2013  FINDINGS: A left-sided central venous line and nasogastric catheter are again noted in satisfactory position. The cardiac shadow remains enlarged. Patient is significantly rotated to the right accentuating the mediastinal markings. Bibasilar infiltrative changes are again seen slightly worse on the right than the left. No acute bony abnormality is noted.  IMPRESSION: Persistent bibasilar changes.   Electronically Signed   By: Inez Catalina M.D.   On: 08/10/2013 12:59   Dg Chest Port 1 View  08/09/2013   CLINICAL DATA:  Decreased O2 saturation.  EXAM: PORTABLE CHEST - 1 VIEW 01:03 a.m.  COMPARISON:  08/08/2013 and 08/07/2013  FINDINGS: The patient has developed and increased infiltrate at the right lung base with new atelectasis and an infiltrate in the left perihilar region as well as new small right pleural effusion. I suspect the findings represent pulmonary edema.  NG tube tip is below the diaphragm. Central venous catheter tip is at the cavoatrial junction.  IMPRESSION: Progressive infiltrate at the right base with new small right effusion.  New atelectasis in faint infiltrate in the left. I suspect the findings represent pulmonary edema.   Electronically Signed   By: [name] M.D.   On: 08/09/2013 01:26   Dg Chest Port 1 View  08/08/2013   CLINICAL DATA:  Central line placement.  EXAM: PORTABLE CHEST - 1 VIEW  COMPARISON:  Chest radiograph performed 08/07/2013  FINDINGS: The  patient's left subclavian line is seen ending about the cavoatrial junction.  Lung expansion is improved, though evaluation is suboptimal due to patient rotation. Patchy right-sided airspace opacities raise concern for pneumonia, perhaps slightly more confluent than on the prior study. The left lung appears clear. No pleural effusion or pneumothorax is seen.  The cardiomediastinal silhouette is normal in size. No acute osseous abnormalities are identified.  IMPRESSION: 1. Left subclavian line seen ending about the cavoatrial junction. 2. Patchy right-sided airspace opacities raise concern for pneumonia, perhaps slightly more confluent than on the prior study.   Electronically Signed   By: Garald Balding M.D.   On: 08/08/2013 01:49   Dg Abd Portable 1v  08/08/2013   CLINICAL DATA:  NG tube placement.  EXAM: PORTABLE ABDOMEN - 1 VIEW  COMPARISON:  None.  FINDINGS: An NG tube is identified with tip overlying the proximal-mid stomach.  The bowel gas pattern is unremarkable.  IMPRESSION: NG tube with tip overlying the proximal -mid stomach.   Electronically Signed   By: [name] M.D.   On: 08/08/2013 15:55   Dg Swallowing Func-speech Pathology  08/12/2013   Katherene Ponto Deblois, CCC-SLP     08/12/2013  2:59 PM Objective Swallowing Evaluation: Modified Barium Swallowing Study   Patient Details  Name: JAVEON MACMURRAY MRN: [id number] Date of Birth: 05/08/1955  Today's Date: 08/12/2013 Time: 1305-1340 SLP Time Calculation (min): 35 min  Past Medical History:  Past Medical History  Diagnosis Date  . Hyperlipidemia   . Anxiety   . Mental retardation     ue to temp of 108 aftrer having measles age 39.  . Hx of diaper rash   . Edema of foot 02/06/2012    BILATERAL   Past Surgical History:  Past Surgical History  Procedure Laterality Date  . Orif ankle fracture  02/06/2012    RIGHT ANKLE  . Orif ankle fracture  02/06/2012  Procedure: OPEN REDUCTION INTERNAL FIXATION (ORIF) ANKLE  FRACTURE;  Surgeon: Wylene Simmer, MD;   Location: [place];  Service:  Orthopedics;  Laterality: Right;  . Syndesmosis repair  02/06/2012    Procedure: SYNDESMOSIS REPAIR;  Surgeon: Wylene Simmer, MD;   Location: [place];  Service: Orthopedics;  Laterality: Right;  tri  maleolar  . Colonoscopy N/A 08/11/2013    Procedure: COLONOSCOPY;  Surgeon: Gatha Mayer, MD;   Location: [place];  Service: Endoscopy;  Laterality: N/A;  . Esophagogastroduodenoscopy N/A 08/11/2013    Procedure: ESOPHAGOGASTRODUODENOSCOPY (EGD);  Surgeon: Gatha Mayer, MD;  Location[facility] ENDOSCOPY;  Service: Endoscopy;   Laterality: N/A;   HPI:  58 year old male with a history of mental retardation,  hyperlipidemia was taken to urgent care because of poor by mouth  intake. Apparently the patient has lost a significant amount of  weight over the last few weeks. The patient was transferred to  the ED and noted to be hypotensive and have leukocytosis with WBC  18.1.  He was seen by GI, determined to have a bleed and EGD  showed large ulcers, suspected to be caused by NSAID.  The  patient was started on intravenous antibiotics for sepsis  secondary to healthcare associated pneumonia and possible  aspiration (right base infiltrate).      Assessment / Plan / Recommendation Clinical Impression  Dysphagia Diagnosis: Mild oral phase dysphagia;Mild pharyngeal  phase dysphagia Clinical impression: Pt presents with a mild oral dysphagia with  tongue pumping, anterior mastication due to missing posterior  dentition. Is able to manage soft solids well. Pt does have a  delayed swallow response, though with consecutive boluses pt is  able to maintain hyolaryngeal elevation and airway protection.  There was one instance of trace penetration above the cords with  mild pyriform sinuses residuals present post swallow with large  sips. At this time, aspiration risk appears quite low. Pt is  recommended to consume mechanical soft diet with thin liquids  with normal PO intake habits (self feeding though somewhat   impulsive). If family notices possible signs of aspiration,  recommend full supervision with POs for small single sips,  restricting straws. No SLP f/u needed at this time.     Treatment Recommendation  No treatment recommended at this time    Diet Recommendation Dysphagia 3 (Mechanical Soft);Thin liquid   Liquid Administration via: Cup;Straw Medication Administration: Whole meds with puree Supervision: Staff to assist with self feeding Compensations: Slow rate;Small sips/bites Postural Changes and/or Swallow Maneuvers: Seated upright 90  degrees    Other  Recommendations Oral Care Recommendations: Oral care BID   Follow Up Recommendations  Other (comment) (group home)    Frequency and Duration        Pertinent Vitals/Pain NA    SLP Swallow Goals     General HPI: 58 year old male with a history of mental  retardation, hyperlipidemia was taken to urgent care because of  poor by mouth intake. Apparently the patient has lost a  significant amount of weight over the last few weeks. The patient  was transferred to the ED and noted to be hypotensive and have  leukocytosis with WBC 18.1.  He was seen by GI, determined to  have a bleed and EGD showed large ulcers, suspected to be caused  by NSAID.  The patient was started on intravenous antibiotics for  sepsis secondary to healthcare associated pneumonia and possible  aspiration (right base infiltrate).  Type of Study: Modified Barium Swallowing Study  Reason for Referral: Objectively evaluate swallowing function Previous Swallow Assessment: none Diet Prior to this Study: Other (Comment) (SOFT) Temperature Spikes Noted: No Respiratory Status: Room air History of Recent Intubation: No Behavior/Cognition: Alert;Cooperative;Pleasant mood;Doesn't  follow directions Oral Cavity - Dentition: Missing dentition Oral Motor / Sensory Function: Within functional limits Self-Feeding Abilities: Able to feed self;Needs assist Patient Positioning: Upright in chair Baseline Vocal Quality:  Clear Volitional Cough: Cognitively unable to elicit Volitional Swallow: Unable to elicit Anatomy: Within functional limits Pharyngeal Secretions: Not observed secondary MBS    Reason for Referral Objectively evaluate swallowing function   Oral Phase Oral Preparation/Oral Phase Oral Phase: Impaired Oral - Thin Oral - Thin Cup: Lingual pumping Oral - Thin Straw: Lingual pumping Oral - Solids Oral - Puree: Lingual pumping Oral - Mechanical Soft: Impaired mastication (only anterior  dentition)   Pharyngeal Phase Pharyngeal Phase Pharyngeal Phase: Impaired Pharyngeal - Thin Pharyngeal - Thin Cup: Delayed swallow initiation;Pharyngeal  residue - pyriform sinuses Pharyngeal - Thin Straw: Delayed swallow initiation;Pharyngeal  residue - pyriform sinuses;Penetration/Aspiration after swallow Penetration/Aspiration details (thin straw): Material enters  airway, remains ABOVE vocal cords then ejected out Pharyngeal - Solids Pharyngeal - Puree: Delayed swallow initiation Pharyngeal - Mechanical Soft: Delayed swallow initiation  Cervical Esophageal Phase    GO    Cervical Esophageal Phase Cervical Esophageal Phase: WFL (appearance of possible hiatal  hernia, no radiologist present)        Herbie Baltimore, MA CCC-SLP [phone]  DeBlois, Katherene Ponto 08/12/2013, 2:57 PM     Microbiology: Recent Results (from the past 240 hour(s))  CULTURE, BLOOD (ROUTINE X 2)     Status: None   Collection Time    08/07/13  7:37 PM      Result Value Ref Range Status   Specimen Description BLOOD LEFT ARM   Final   Special Requests BOTTLES DRAWN AEROBIC AND ANAEROBIC 6ML   Final   Culture  Setup Time     Final   Value: 08/08/2013 14:49     Performed at [facility]   Culture     Final   Value: STAPHYLOCOCCUS SPECIES (COAGULASE NEGATIVE)     Note: THE SIGNIFICANCE OF ISOLATING THIS ORGANISM FROM A SINGLE SET OF BLOOD CULTURES WHEN MULTIPLE SETS ARE DRAWN IS UNCERTAIN. PLEASE NOTIFY THE MICROBIOLOGY DEPARTMENT WITHIN ONE WEEK IF  SPECIATION AND SENSITIVITIES ARE REQUIRED.     Note: Gram Stain Report Called to,Read Back By and Verified With: Roderic Ovens RN [phone]     Performed at [facility]   Report Status 08/12/2013 FINAL   Final  CULTURE, BLOOD (ROUTINE X 2)     Status: None   Collection Time    08/07/13  9:45 PM      Result Value Ref Range Status   Specimen Description BLOOD LEFT HAND   Final   Special Requests BOTTLES DRAWN AEROBIC AND ANAEROBIC 5 CC   Final   Culture  Setup Time     Final   Value: 08/08/2013 14:49     Performed at [facility]   Culture     Final   Value:        BLOOD CULTURE RECEIVED NO GROWTH TO DATE CULTURE WILL BE HELD FOR 5 DAYS BEFORE ISSUING A FINAL NEGATIVE REPORT     Performed at [facility]   Report Status PENDING   Incomplete  MRSA PCR SCREENING     Status: None   Collection Time    08/07/13  9:52  PM      Result Value Ref Range Status   MRSA by PCR NEGATIVE  NEGATIVE Final   Comment:            The GeneXpert MRSA Assay (FDA     approved for NASAL specimens     only), is one component of a     comprehensive MRSA colonization     surveillance program. It is not     intended to diagnose MRSA     infection nor to guide or     monitor treatment for     MRSA infections.     Labs: Basic Metabolic Panel:  Recent Labs Lab 08/09/13 0320 08/09/13 0418 08/10/13 0415 08/11/13 0450 08/12/13 0400 08/13/13 0828  NA 147  --  146 149* 147 141  K 2.9*  --  3.0* 2.8* 3.2* 3.9  CL 111  --  114* 112 112 106  CO2 21  --  20 20 21 23   GLUCOSE 96  --  113* 80 98 90  BUN 22  --  12 8 6  4*  CREATININE 0.60  --  0.57 0.54 0.47* 0.40*  CALCIUM 7.5*  --  7.7* 7.7* 7.6* 7.8*  MG  --  2.2  --   --   --   --    Liver Function Tests:  Recent Labs Lab 08/07/13 1735 08/08/13 0530  AST 18 15  ALT 24 18  ALKPHOS 85 67  BILITOT 0.2* 0.8  PROT 6.1 4.7*  ALBUMIN 2.0* 1.5*   No results found for this basename: LIPASE, AMYLASE,  in the last 168 hours No  results found for this basename: AMMONIA,  in the last 168 hours CBC:  Recent Labs Lab 08/07/13 1735  08/08/13 0530 08/08/13 1230 08/09/13 0320 08/10/13 0415 08/11/13 0450 08/12/13 0400  WBC 18.1*  < > 27.7* 20.9* 17.3* 17.9* 12.3* 12.8*  NEUTROABS 15.8*  --  26.3* 19.0*  --   --  9.9*  --   HGB 5.3*  < > 6.5* 7.9* 10.3* 10.9* 11.2* 11.1*  HCT 17.8*  < > 20.7* 23.9* 30.9* 33.8* 34.7* 34.8*  MCV 79.1  < > 85.2 83.6 84.2 83.5 83.8 84.5  PLT 992*  < > 611* 498* 422* 482* 501* 488*  < > = values in this interval not displayed. Cardiac Enzymes: No results found for this basename: CKTOTAL, CKMB, CKMBINDEX, TROPONINI,  in the last 168 hours BNP: BNP (last 3 results)  Recent Labs  08/10/13 0415  PROBNP 1887.0*   CBG:  Recent Labs Lab 08/13/13 0759 08/13/13 1216 08/13/13 1714 08/13/13 2059 08/14/13 0751  GLUCAP 83 82 101* 101* 107*       Signed:  Xaviar Lunn  [facility] 08/14/2013, 10:07 AM

## 2013-08-16 ENCOUNTER — Encounter: Payer: Self-pay | Admitting: Internal Medicine

## 2013-08-16 NOTE — Progress Notes (Signed)
Quick Note:  Ulcers benign and no infection He is mentally retarded - FYI Call sisters - likely needs a repeat EGD in 3 months I think to document healing - though given his overall status could just do office f/u possibly Would need to be at hospital Dr. Fuller Plan is primary GI MD and am ccing ______

## 2013-08-17 ENCOUNTER — Other Ambulatory Visit: Payer: Self-pay

## 2013-08-17 MED ORDER — OMEPRAZOLE 40 MG PO CPDR: DELAYED_RELEASE_CAPSULE | ORAL | Status: DC

## 2013-08-17 MED ORDER — PANTOPRAZOLE SODIUM 40 MG PO TBEC: DELAYED_RELEASE_TABLET | ORAL | Status: DC

## 2013-08-17 MED ORDER — FERROUS SULFATE 325 (65 FE) MG PO TABS: 325.0000 mg | ORAL_TABLET | Freq: Two times a day (BID) | ORAL | Status: DC

## 2013-08-17 NOTE — Telephone Encounter (Signed)
Insurance won't cover protonix, per Dr. Carlean Purl called omeprazole in for him.

## 2013-08-18 MED ORDER — ESOMEPRAZOLE MAGNESIUM 40 MG PO CPDR: 40.0000 mg | DELAYED_RELEASE_CAPSULE | Freq: Two times a day (BID) | ORAL | Status: DC

## 2013-08-18 NOTE — Addendum Note (Signed)
Addended by: Marlon Pel on: 08/18/2013 11:40 AM   Modules accepted: Orders, Medications

## 2013-08-18 NOTE — Telephone Encounter (Signed)
Ins won't cover omeprazole either.  Will change to nexium 40 BID.  Rx orally given to [facility] at [facility]

## 2013-08-26 ENCOUNTER — Emergency Department (HOSPITAL_COMMUNITY): Payer: Medicare Other

## 2013-08-26 ENCOUNTER — Encounter (HOSPITAL_COMMUNITY): Payer: Self-pay | Admitting: Emergency Medicine

## 2013-08-26 ENCOUNTER — Emergency Department (HOSPITAL_COMMUNITY)
Admission: EM | Admit: 2013-08-26 | Discharge: 2013-08-26 | Disposition: A | Discharge status: Home / Self Care | Payer: Medicare Other | Attending: Emergency Medicine | Admitting: Emergency Medicine

## 2013-08-26 DIAGNOSIS — Z872 Personal history of diseases of the skin and subcutaneous tissue: Secondary | ICD-10-CM | POA: Insufficient documentation

## 2013-08-26 DIAGNOSIS — Z9889 Other specified postprocedural states: Secondary | ICD-10-CM | POA: Diagnosis not present

## 2013-08-26 DIAGNOSIS — R630 Anorexia: Secondary | ICD-10-CM | POA: Insufficient documentation

## 2013-08-26 DIAGNOSIS — R638 Other symptoms and signs concerning food and fluid intake: Secondary | ICD-10-CM

## 2013-08-26 DIAGNOSIS — R1110 Vomiting, unspecified: Secondary | ICD-10-CM | POA: Diagnosis present

## 2013-08-26 DIAGNOSIS — Z8659 Personal history of other mental and behavioral disorders: Secondary | ICD-10-CM | POA: Diagnosis not present

## 2013-08-26 DIAGNOSIS — Z79899 Other long term (current) drug therapy: Secondary | ICD-10-CM | POA: Insufficient documentation

## 2013-08-26 DIAGNOSIS — E785 Hyperlipidemia, unspecified: Secondary | ICD-10-CM | POA: Insufficient documentation

## 2013-08-26 DIAGNOSIS — Z8719 Personal history of other diseases of the digestive system: Secondary | ICD-10-CM | POA: Insufficient documentation

## 2013-08-26 DIAGNOSIS — R609 Edema, unspecified: Secondary | ICD-10-CM | POA: Insufficient documentation

## 2013-08-26 DIAGNOSIS — R197 Diarrhea, unspecified: Secondary | ICD-10-CM | POA: Diagnosis not present

## 2013-08-26 DIAGNOSIS — F17200 Nicotine dependence, unspecified, uncomplicated: Secondary | ICD-10-CM | POA: Insufficient documentation

## 2013-08-26 DIAGNOSIS — Z792 Long term (current) use of antibiotics: Secondary | ICD-10-CM | POA: Diagnosis not present

## 2013-08-26 LAB — CBC WITH DIFFERENTIAL/PLATELET
BASOS ABS: 0 10*3/uL (ref 0.0–0.1)
BASOS PCT: 0 % (ref 0–1)
EOS PCT: 1 % (ref 0–5)
Eosinophils Absolute: 0.1 10*3/uL (ref 0.0–0.7)
HCT: 40.3 % (ref 39.0–52.0)
Hemoglobin: 12.9 g/dL — ABNORMAL LOW (ref 13.0–17.0)
LYMPHS ABS: 1.5 10*3/uL (ref 0.7–4.0)
Lymphocytes Relative: 15 % (ref 12–46)
MCH: 27.8 pg (ref 26.0–34.0)
MCHC: 32 g/dL (ref 30.0–36.0)
MCV: 86.9 fL (ref 78.0–100.0)
Monocytes Absolute: 0.6 10*3/uL (ref 0.1–1.0)
Monocytes Relative: 6 % (ref 3–12)
NEUTROS PCT: 78 % — AB (ref 43–77)
Neutro Abs: 7.5 10*3/uL (ref 1.7–7.7)
PLATELETS: 590 10*3/uL — AB (ref 150–400)
RBC: 4.64 MIL/uL (ref 4.22–5.81)
RDW: 18.6 % — AB (ref 11.5–15.5)
WBC: 9.8 10*3/uL (ref 4.0–10.5)

## 2013-08-26 LAB — COMPREHENSIVE METABOLIC PANEL
ALBUMIN: 2.3 g/dL — AB (ref 3.5–5.2)
ALK PHOS: 113 U/L (ref 39–117)
ALT: 11 U/L (ref 0–53)
AST: 12 U/L (ref 0–37)
Anion gap: 15 (ref 5–15)
BUN: 6 mg/dL (ref 6–23)
CO2: 23 mEq/L (ref 19–32)
Calcium: 9.1 mg/dL (ref 8.4–10.5)
Chloride: 98 mEq/L (ref 96–112)
Creatinine, Ser: 0.39 mg/dL — ABNORMAL LOW (ref 0.50–1.35)
GFR calc Af Amer: >90 mL/min (ref >90)
GFR calc non Af Amer: >90 mL/min (ref >90)
Glucose, Bld: 99 mg/dL (ref 70–99)
POTASSIUM: 4.4 meq/L (ref 3.7–5.3)
SODIUM: 136 meq/L — AB (ref 137–147)
Total Bilirubin: 0.3 mg/dL (ref 0.3–1.2)
Total Protein: 6.8 g/dL (ref 6.0–8.3)

## 2013-08-26 LAB — URINALYSIS, ROUTINE W REFLEX MICROSCOPIC
BILIRUBIN URINE: NEGATIVE
Glucose, UA: NEGATIVE mg/dL
HGB URINE DIPSTICK: NEGATIVE
Ketones, ur: NEGATIVE mg/dL
Leukocytes, UA: NEGATIVE
Nitrite: NEGATIVE
PH: 7.5 (ref 5.0–8.0)
Protein, ur: NEGATIVE mg/dL
SPECIFIC GRAVITY, URINE: 1.016 (ref 1.005–1.030)
UROBILINOGEN UA: 1 mg/dL (ref 0.0–1.0)

## 2013-08-26 MED ORDER — ONDANSETRON HCL 4 MG/2ML IJ SOLN: 4.0000 mg | Freq: Once | INTRAMUSCULAR | Status: DC

## 2013-08-26 MED ORDER — ONDANSETRON 4 MG PO TBDP
4.0000 mg | ORAL_TABLET | Freq: Once | ORAL | Status: AC
  Administered 2013-08-26: 4 mg via ORAL
  Filled 2013-08-26: qty 1

## 2013-08-26 NOTE — ED Notes (Signed)
Caregiver states she must leave. Contact information left in case it is needed.  Sister: Fae Pippin [phone] Program Manager: Azzie Roup [phone]

## 2013-08-26 NOTE — Discharge Instructions (Signed)
Follow up with your primary doctor within about a week.  Call sooner or return to the Emergency Department if you develop abdominal pain, fevers, vomiting, or other concerning symptoms.

## 2013-08-26 NOTE — ED Notes (Signed)
Pts belongings left behind. Group home called and informed that his 2 tee-shirts and 1 pair of shorts will be placed in the security office. Group home stated they would inform the daytime manager.

## 2013-08-26 NOTE — ED Notes (Signed)
Patient from [facility], and just discharged from hospital 2 weeks ago for emesis and not eating.   Per caregiver, patient is not eating and has been vomiting since.   Patient cannot verbalize what is wrong but signs for coffee.

## 2013-08-26 NOTE — ED Notes (Signed)
Mechanical soft meal tray ordered for pt.

## 2013-08-26 NOTE — ED Notes (Signed)
Family at bedside. 

## 2013-08-26 NOTE — ED Provider Notes (Signed)
CSN: [id number]     Arrival date & time 08/26/13  1030 History   First MD Initiated Contact with Patient 08/26/13 1140     Chief Complaint  Patient presents with  . Emesis  . not eating      (Consider location/radiation/quality/duration/timing/severity/associated sxs/prior Treatment) Patient is a 58 y.o. male presenting with vomiting.  Emesis Severity:  Moderate Duration: to some degree since leaving the hospital, worse past day. Timing:  Constant Emesis appearance: pink. Chronicity:  Recurrent Relieved by:  Nothing Worsened by:  Nothing tried Associated symptoms: diarrhea (mild, non bloody, not dark colored)   Associated symptoms comment:  Anorexia   Past Medical History  Diagnosis Date  . Hyperlipidemia   . Anxiety   . Mental retardation     ue to temp of 108 aftrer having measles age 21.  . Hx of diaper rash   . Edema of foot 02/06/2012    BILATERAL   Past Surgical History  Procedure Laterality Date  . Orif ankle fracture  02/06/2012    RIGHT ANKLE  . Orif ankle fracture  02/06/2012    Procedure: OPEN REDUCTION INTERNAL FIXATION (ORIF) ANKLE FRACTURE;  Surgeon: Wylene Simmer, MD;  Location: [place];  Service: Orthopedics;  Laterality: Right;  . Syndesmosis repair  02/06/2012    Procedure: SYNDESMOSIS REPAIR;  Surgeon: Wylene Simmer, MD;  Location: [place];  Service: Orthopedics;  Laterality: Right;  tri maleolar  . Colonoscopy N/A 08/11/2013    Procedure: COLONOSCOPY;  Surgeon: Gatha Mayer, MD;  Location: [place];  Service: Endoscopy;  Laterality: N/A;  . Esophagogastroduodenoscopy N/A 08/11/2013    Procedure: ESOPHAGOGASTRODUODENOSCOPY (EGD);  Surgeon: Gatha Mayer, MD;  Location[facility] END[facility]COPY;  Service: Endoscopy;  Laterality: N/A;   Family History  Problem Relation Age of Onset  . Diabetes Mellitus II Mother   . Diabetes Mellitus II Father   . Lung cancer Sister   . Lung cancer Brother   . CAD Brother    History  Substance Use Topics  . Smoking status:  Current Every Day Smoker -- 0.50 packs/day for 15 years    Types: Cigarettes  . Smokeless tobacco: Never Used  . Alcohol Use: No    Review of Systems  Unable to perform ROS: Patient nonverbal  Gastrointestinal: Positive for vomiting and diarrhea (mild, non bloody, not dark colored).      Allergies  Haldol  Home Medications   Prior to Admission medications   Medication Sig Start Date End Date Taking? Authorizing Provider  acetaminophen (TYLENOL) 325 MG tablet Take 2 tablets (650 mg total) by mouth every 6 (six) hours as needed for mild pain (or Fever >/= 101). 08/14/13   Kelvin Cellar, MD  amoxicillin-clavulanate (AUGMENTIN) 875-125 MG per tablet Take 1 tablet by mouth every 12 (twelve) hours. 08/14/13   Kelvin Cellar, MD  atorvastatin (LIPITOR) 10 MG tablet Take 10 mg by mouth daily.    Historical Provider, MD  chlorhexidine (PERIDEX) 0.12 % solution Use as directed 10 mLs in the mouth or throat 3 (three) times daily. Apply to teeth and gums with toothbrush (staff hand over hand)    Historical Provider, MD  cholecalciferol (VITAMIN D) 1000 UNITS tablet Take 2,000 Units by mouth daily.    Historical Provider, MD  esomeprazole (NEXIUM) 40 MG capsule Take 1 capsule (40 mg total) by mouth 2 (two) times daily. 08/18/13   Gatha Mayer, MD  ferrous sulfate (FERROUSUL) 325 (65 FE) MG tablet Take 1 tablet (325 mg total) by  mouth 2 (two) times daily. 08/17/13   Ladene Artist, MD  polyethylene glycol [facility] / Floria Raveling) packet Take 17 g by mouth 2 (two) times daily. 08/14/13   Kelvin Cellar, MD  psyllium (METAMUCIL) 58.6 % powder Take 1 packet by mouth daily.    Historical Provider, MD   BP 139/106  Pulse 92  Temp(Src) 97.4 F (36.3 C) (Oral)  Resp 19  SpO2 98% Physical Exam  Nursing note and vitals reviewed. Constitutional: He is oriented to person, place, and time. He appears well-developed. No distress.  Laying curled up in bed  HENT:  Head: Normocephalic and atraumatic.   Mouth/Throat: Oropharynx is clear and moist.  Eyes: Conjunctivae are normal. Pupils are equal, round, and reactive to light. No scleral icterus.  Neck: Neck supple.  Cardiovascular: Normal rate, regular rhythm, normal heart sounds and intact distal pulses.   No murmur heard. Pulmonary/Chest: Effort normal and breath sounds normal. No stridor. No respiratory distress. He has no wheezes. He has no rales.  Abdominal: Soft. He exhibits no distension. There is no tenderness. There is no rebound and no guarding.  Musculoskeletal: Normal range of motion. He exhibits edema (2+ BLE).  Neurological: He is alert and oriented to person, place, and time.  Skin: Skin is warm and dry. No rash noted.  Psychiatric: He has a normal mood and affect. His behavior is normal.    ED Course  Procedures (including critical care time) Labs Review Labs Reviewed  CBC WITH DIFFERENTIAL - Abnormal; Notable for the following:    Hemoglobin 12.9 (*)    RDW 18.6 (*)    Platelets 590 (*)    Neutrophils Relative % 78 (*)    All other components within normal limits  COMPREHENSIVE METABOLIC PANEL - Abnormal; Notable for the following:    Sodium 136 (*)    Creatinine, Ser 0.39 (*)    Albumin 2.3 (*)    All other components within normal limits  URINALYSIS, ROUTINE W REFLEX MICROSCOPIC    Imaging Review Dg Abd Acute W/chest  08/26/2013   CLINICAL DATA:  Emesis.  Poorly eating.  EXAM: ACUTE ABDOMEN SERIES (ABDOMEN 2 VIEW & CHEST 1 VIEW)  COMPARISON:  Chest x-ray a 08/10/2013. Abdominal radiograph 08/08/2013.  FINDINGS: Lung volumes are low. Slight coarsening of interstitial markings, similar to prior examinations. No consolidative airspace disease. No pleural effusions. No pneumothorax. No pulmonary nodule or mass noted. Pulmonary vasculature and the cardiomediastinal silhouette are within normal limits.  Gas and stool are seen scattered throughout the colon extending to the level of the distal rectum. No pathologic  distension of small bowel is noted. No gross evidence of pneumoperitoneum. Multiple nondilated gas-filled loops of small bowel throughout the central abdomen.  IMPRESSION: 1. Nonspecific, nonobstructive bowel gas pattern. 2. No pneumoperitoneum. 3. Low lung volumes without radiographic evidence of acute cardiopulmonary disease.   Electronically Signed   By: Vinnie Langton M.D.   On: 08/26/2013 18:58  All radiology studies independently viewed by me.      EKG Interpretation   Date/Time:  Thursday August 26 2013 12:45:54 EDT Ventricular Rate:  80 PR Interval:  137 QRS Duration: 72 QT Interval:  349 QTC Calculation: 402 R Axis:   67 Text Interpretation:  Sinus rhythm Artifact in lead(s) II aVR aVF V1 V2 V3  V4 V5 artifact Otherwise no significant change Confirmed by [facility]  MD,  TREY ([id number]) on 08/26/2013 3:45:11 PM      MDM   Final diagnoses:  Decreased oral intake  58 yo male with hx of MR presenting with decreased PO intake.  On initial exam, well appearing, nontoxic, abdomen soft and nontender.  He had a recent hospitalization for severe GI bleed and required multiple units of blood.  Since then his appetite has been poor.  He has been tolerating ensure drinks and other liquids.  His labwork is reassuring.  Hg has improved since discharge.  AAS negative.  Pt drank fluids while in ED without difficulty.  No vomiting in ED. Not dehydrated by exam or labs.  DCd home.  Advised close PCP follow up for monitoring of nutritional status.      Houston Siren III, MD 08/27/13 [phone]

## 2013-08-27 ENCOUNTER — Telehealth: Payer: Self-pay | Admitting: Gastroenterology

## 2013-08-27 NOTE — Telephone Encounter (Signed)
Left message for patient to call back  

## 2013-08-27 NOTE — Telephone Encounter (Signed)
I spoke with the nurse from the group home.  Patient was taken back to the ER yesterday for continued vomiting.  ER note is not complete.  He will come in next Thursday and see Tye Savoy RNP on 09/02/13 3:00

## 2013-09-02 ENCOUNTER — Ambulatory Visit (INDEPENDENT_AMBULATORY_CARE_PROVIDER_SITE_OTHER): Payer: Medicare Other | Admitting: Nurse Practitioner

## 2013-09-02 ENCOUNTER — Encounter: Payer: Self-pay | Admitting: Nurse Practitioner

## 2013-09-02 ENCOUNTER — Inpatient Hospital Stay (HOSPITAL_COMMUNITY): Payer: Medicare Other

## 2013-09-02 ENCOUNTER — Encounter (HOSPITAL_COMMUNITY): Payer: Self-pay | Admitting: General Practice

## 2013-09-02 ENCOUNTER — Inpatient Hospital Stay (HOSPITAL_COMMUNITY)
Admission: AD | Admit: 2013-09-02 | Discharge: 2013-09-13 | DRG: 391 | Disposition: A | Discharge status: Skilled Nursing Facility | Payer: Medicare Other | Source: Ambulatory Visit | Attending: Internal Medicine | Admitting: Internal Medicine

## 2013-09-02 VITALS — BP 130/90 | HR 100

## 2013-09-02 DIAGNOSIS — I071 Rheumatic tricuspid insufficiency: Secondary | ICD-10-CM | POA: Diagnosis present

## 2013-09-02 DIAGNOSIS — D649 Anemia, unspecified: Secondary | ICD-10-CM

## 2013-09-02 DIAGNOSIS — R933 Abnormal findings on diagnostic imaging of other parts of digestive tract: Secondary | ICD-10-CM

## 2013-09-02 DIAGNOSIS — Z8249 Family history of ischemic heart disease and other diseases of the circulatory system: Secondary | ICD-10-CM | POA: Diagnosis not present

## 2013-09-02 DIAGNOSIS — E785 Hyperlipidemia, unspecified: Secondary | ICD-10-CM | POA: Diagnosis present

## 2013-09-02 DIAGNOSIS — R630 Anorexia: Secondary | ICD-10-CM | POA: Diagnosis present

## 2013-09-02 DIAGNOSIS — R195 Other fecal abnormalities: Secondary | ICD-10-CM

## 2013-09-02 DIAGNOSIS — R197 Diarrhea, unspecified: Secondary | ICD-10-CM

## 2013-09-02 DIAGNOSIS — K257 Chronic gastric ulcer without hemorrhage or perforation: Secondary | ICD-10-CM | POA: Diagnosis present

## 2013-09-02 DIAGNOSIS — D75839 Thrombocytosis, unspecified: Secondary | ICD-10-CM

## 2013-09-02 DIAGNOSIS — K259 Gastric ulcer, unspecified as acute or chronic, without hemorrhage or perforation: Secondary | ICD-10-CM | POA: Diagnosis present

## 2013-09-02 DIAGNOSIS — I519 Heart disease, unspecified: Secondary | ICD-10-CM | POA: Diagnosis present

## 2013-09-02 DIAGNOSIS — K209 Esophagitis, unspecified without bleeding: Secondary | ICD-10-CM | POA: Diagnosis present

## 2013-09-02 DIAGNOSIS — R112 Nausea with vomiting, unspecified: Secondary | ICD-10-CM

## 2013-09-02 DIAGNOSIS — R627 Adult failure to thrive: Secondary | ICD-10-CM | POA: Diagnosis present

## 2013-09-02 DIAGNOSIS — IMO0002 Reserved for concepts with insufficient information to code with codable children: Secondary | ICD-10-CM | POA: Diagnosis not present

## 2013-09-02 DIAGNOSIS — J189 Pneumonia, unspecified organism: Secondary | ICD-10-CM

## 2013-09-02 DIAGNOSIS — R633 Feeding difficulties, unspecified: Secondary | ICD-10-CM | POA: Diagnosis present

## 2013-09-02 DIAGNOSIS — Z87891 Personal history of nicotine dependence: Secondary | ICD-10-CM | POA: Diagnosis not present

## 2013-09-02 DIAGNOSIS — Z8711 Personal history of peptic ulcer disease: Secondary | ICD-10-CM

## 2013-09-02 DIAGNOSIS — R1310 Dysphagia, unspecified: Secondary | ICD-10-CM | POA: Diagnosis present

## 2013-09-02 DIAGNOSIS — D72829 Elevated white blood cell count, unspecified: Secondary | ICD-10-CM

## 2013-09-02 DIAGNOSIS — K2289 Other specified disease of esophagus: Secondary | ICD-10-CM | POA: Diagnosis present

## 2013-09-02 DIAGNOSIS — M6250 Muscle wasting and atrophy, not elsewhere classified, unspecified site: Secondary | ICD-10-CM | POA: Diagnosis present

## 2013-09-02 DIAGNOSIS — K279 Peptic ulcer, site unspecified, unspecified as acute or chronic, without hemorrhage or perforation: Secondary | ICD-10-CM

## 2013-09-02 DIAGNOSIS — E876 Hypokalemia: Secondary | ICD-10-CM

## 2013-09-02 DIAGNOSIS — K269 Duodenal ulcer, unspecified as acute or chronic, without hemorrhage or perforation: Secondary | ICD-10-CM

## 2013-09-02 DIAGNOSIS — F79 Unspecified intellectual disabilities: Secondary | ICD-10-CM | POA: Diagnosis present

## 2013-09-02 DIAGNOSIS — Z801 Family history of malignant neoplasm of trachea, bronchus and lung: Secondary | ICD-10-CM

## 2013-09-02 DIAGNOSIS — Z833 Family history of diabetes mellitus: Secondary | ICD-10-CM

## 2013-09-02 DIAGNOSIS — K228 Other specified diseases of esophagus: Secondary | ICD-10-CM | POA: Diagnosis present

## 2013-09-02 DIAGNOSIS — R1313 Dysphagia, pharyngeal phase: Secondary | ICD-10-CM | POA: Diagnosis present

## 2013-09-02 DIAGNOSIS — K449 Diaphragmatic hernia without obstruction or gangrene: Secondary | ICD-10-CM | POA: Diagnosis present

## 2013-09-02 DIAGNOSIS — E43 Unspecified severe protein-calorie malnutrition: Secondary | ICD-10-CM | POA: Diagnosis present

## 2013-09-02 DIAGNOSIS — F411 Generalized anxiety disorder: Secondary | ICD-10-CM | POA: Diagnosis present

## 2013-09-02 DIAGNOSIS — I079 Rheumatic tricuspid valve disease, unspecified: Secondary | ICD-10-CM | POA: Diagnosis present

## 2013-09-02 DIAGNOSIS — K222 Esophageal obstruction: Secondary | ICD-10-CM | POA: Diagnosis present

## 2013-09-02 DIAGNOSIS — K5900 Constipation, unspecified: Secondary | ICD-10-CM | POA: Diagnosis present

## 2013-09-02 DIAGNOSIS — R1311 Dysphagia, oral phase: Secondary | ICD-10-CM | POA: Diagnosis present

## 2013-09-02 DIAGNOSIS — K219 Gastro-esophageal reflux disease without esophagitis: Secondary | ICD-10-CM | POA: Diagnosis present

## 2013-09-02 DIAGNOSIS — D509 Iron deficiency anemia, unspecified: Secondary | ICD-10-CM | POA: Diagnosis present

## 2013-09-02 DIAGNOSIS — E870 Hyperosmolality and hypernatremia: Secondary | ICD-10-CM | POA: Diagnosis present

## 2013-09-02 DIAGNOSIS — R634 Abnormal weight loss: Secondary | ICD-10-CM | POA: Diagnosis present

## 2013-09-02 DIAGNOSIS — E860 Dehydration: Secondary | ICD-10-CM | POA: Diagnosis present

## 2013-09-02 DIAGNOSIS — D473 Essential (hemorrhagic) thrombocythemia: Secondary | ICD-10-CM

## 2013-09-02 DIAGNOSIS — D62 Acute posthemorrhagic anemia: Secondary | ICD-10-CM

## 2013-09-02 DIAGNOSIS — I5189 Other ill-defined heart diseases: Secondary | ICD-10-CM | POA: Diagnosis present

## 2013-09-02 DIAGNOSIS — R911 Solitary pulmonary nodule: Secondary | ICD-10-CM

## 2013-09-02 DIAGNOSIS — K2981 Duodenitis with bleeding: Secondary | ICD-10-CM

## 2013-09-02 DIAGNOSIS — R21 Rash and other nonspecific skin eruption: Secondary | ICD-10-CM

## 2013-09-02 LAB — COMPREHENSIVE METABOLIC PANEL
ALBUMIN: 2.6 g/dL — AB (ref 3.5–5.2)
ALT: 10 U/L (ref 0–53)
AST: 14 U/L (ref 0–37)
Alkaline Phosphatase: 82 U/L (ref 39–117)
Anion gap: 18 — ABNORMAL HIGH (ref 5–15)
BUN: 13 mg/dL (ref 6–23)
CALCIUM: 9.9 mg/dL (ref 8.4–10.5)
CO2: 22 mEq/L (ref 19–32)
Chloride: 108 mEq/L (ref 96–112)
Creatinine, Ser: 0.44 mg/dL — ABNORMAL LOW (ref 0.50–1.35)
GFR calc Af Amer: >90 mL/min (ref >90)
GFR calc non Af Amer: >90 mL/min (ref >90)
Glucose, Bld: 85 mg/dL (ref 70–99)
Potassium: 3.6 mEq/L — ABNORMAL LOW (ref 3.7–5.3)
Sodium: 148 mEq/L — ABNORMAL HIGH (ref 137–147)
TOTAL PROTEIN: 6.3 g/dL (ref 6.0–8.3)
Total Bilirubin: 0.4 mg/dL (ref 0.3–1.2)

## 2013-09-02 LAB — CBC
HCT: 41.8 % (ref 39.0–52.0)
Hemoglobin: 12.7 g/dL — ABNORMAL LOW (ref 13.0–17.0)
MCH: 27.3 pg (ref 26.0–34.0)
MCHC: 30.4 g/dL (ref 30.0–36.0)
MCV: 89.9 fL (ref 78.0–100.0)
Platelets: 383 10*3/uL (ref 150–400)
RBC: 4.65 MIL/uL (ref 4.22–5.81)
RDW: 19 % — ABNORMAL HIGH (ref 11.5–15.5)
WBC: 6.7 10*3/uL (ref 4.0–10.5)

## 2013-09-02 LAB — TSH: TSH: 2.38 u[IU]/mL (ref 0.350–4.500)

## 2013-09-02 MED ORDER — PSYLLIUM 95 % PO PACK
1.0000 | PACK | Freq: Every day | ORAL | Status: DC
  Filled 2013-09-02: qty 1

## 2013-09-02 MED ORDER — VITAMIN D3 25 MCG (1000 UNIT) PO TABS
2000.0000 [IU] | ORAL_TABLET | Freq: Every day | ORAL | Status: DC
  Administered 2013-09-03 – 2013-09-04 (×2): 2000 [IU] via ORAL
  Filled 2013-09-02 (×4): qty 2

## 2013-09-02 MED ORDER — CHLORHEXIDINE GLUCONATE 0.12 % MT SOLN
10.0000 mL | Freq: Three times a day (TID) | OROMUCOSAL | Status: DC
  Administered 2013-09-02 – 2013-09-08 (×7): 10 mL via OROMUCOSAL
  Filled 2013-09-02 (×22): qty 15

## 2013-09-02 MED ORDER — ATORVASTATIN CALCIUM 10 MG PO TABS
10.0000 mg | ORAL_TABLET | Freq: Every day | ORAL | Status: DC
  Administered 2013-09-02 – 2013-09-13 (×10): 10 mg via ORAL
  Filled 2013-09-02 (×12): qty 1

## 2013-09-02 MED ORDER — PSYLLIUM 58.6 % PO POWD
1.0000 | Freq: Every day | ORAL | Status: DC
Start: 2013-09-02 — End: 2013-09-02

## 2013-09-02 MED ORDER — ONDANSETRON HCL 4 MG PO TABS: 4.0000 mg | ORAL_TABLET | Freq: Four times a day (QID) | ORAL | Status: DC | PRN

## 2013-09-02 MED ORDER — ACETAMINOPHEN 325 MG PO TABS: 650.0000 mg | ORAL_TABLET | Freq: Four times a day (QID) | ORAL | Status: DC | PRN

## 2013-09-02 MED ORDER — PANTOPRAZOLE SODIUM 40 MG IV SOLR
40.0000 mg | Freq: Two times a day (BID) | INTRAVENOUS | Status: DC
  Administered 2013-09-02 – 2013-09-06 (×7): 40 mg via INTRAVENOUS
  Filled 2013-09-02 (×11): qty 40

## 2013-09-02 MED ORDER — CETYLPYRIDINIUM CHLORIDE 0.05 % MT LIQD
7.0000 mL | Freq: Two times a day (BID) | OROMUCOSAL | Status: DC
  Administered 2013-09-02 – 2013-09-08 (×5): 7 mL via OROMUCOSAL

## 2013-09-02 MED ORDER — ONDANSETRON HCL 4 MG/2ML IJ SOLN: 4.0000 mg | Freq: Four times a day (QID) | INTRAMUSCULAR | Status: DC | PRN

## 2013-09-02 MED ORDER — FERROUS SULFATE 325 (65 FE) MG PO TABS
325.0000 mg | ORAL_TABLET | Freq: Two times a day (BID) | ORAL | Status: DC
Start: 2013-09-02 — End: 2013-09-03
  Administered 2013-09-02: 325 mg via ORAL
  Filled 2013-09-02 (×3): qty 1

## 2013-09-02 MED ORDER — SODIUM CHLORIDE 0.9 % IV SOLN
INTRAVENOUS | Status: DC
  Administered 2013-09-02 – 2013-09-03 (×2): via INTRAVENOUS

## 2013-09-02 MED ORDER — ENOXAPARIN SODIUM 30 MG/0.3ML ~~LOC~~ SOLN
30.0000 mg | SUBCUTANEOUS | Status: DC
  Administered 2013-09-02 – 2013-09-04 (×2): 30 mg via SUBCUTANEOUS
  Filled 2013-09-02 (×3): qty 0.3

## 2013-09-02 NOTE — H&P (Addendum)
[facility] History and Physical  Steven [name] VQQ:[id number] DOB: 1955-07-25 DOA: 09/02/2013  Referring physician: EDP PCP: [name], MD   Chief Complaint:   HPI: Steven [name] is a 58 y.o. male with past medical history significant for MR and other medical problems as listed below, recently discharged from hospital on 8/1 following similar presentation with poor by mouth intake, weight loss>> was found to be CVA early anemic and workup including EGD revealed Large ulcer was found in the prepyloric region of the stomach and Multiple small medium sized non-bleeding ulcers were found in the duodenal bulb. Colonoscopy was negative. Patient was also found to have a pneumonia. He is mostly non-verbal per his sister's and caregiver were the source of the history. His caregiver states that about a week ago he was brought back to the ED for nausea vomiting, and following evaluation and treatment in the ED he was discharged back home. She states that since then patient has had very poor by mouth intake-maybe a couple of bites the day and that he's not been taking his medications as he spits them out. He was seen in the GI office today for followup visit, and directly admitted for further evaluation and management. Per caregiver is no cough, fevers, bleeding or nausea vomiting noted. They state that he's had some diarrhea-about 3 stools a day.    Review of Systems As per history of present illness, otherwise unobtainable.   Past Medical History  Diagnosis Date  . Hyperlipidemia   . Anxiety   . Mental retardation     ue to temp of 108 aftrer having measles age 36.  . Hx of diaper rash   . Edema of foot 02/06/2012    BILATERAL   Past Surgical History  Procedure Laterality Date  . Orif ankle fracture  02/06/2012    RIGHT ANKLE  . Orif ankle fracture  02/06/2012    Procedure: OPEN REDUCTION INTERNAL FIXATION (ORIF) ANKLE FRACTURE;  Surgeon: Wylene Simmer, MD;  Location: [place];   Service: Orthopedics;  Laterality: Right;  . Syndesmosis repair  02/06/2012    Procedure: SYNDESMOSIS REPAIR;  Surgeon: Wylene Simmer, MD;  Location: [place];  Service: Orthopedics;  Laterality: Right;  tri maleolar  . Colonoscopy N/A 08/11/2013    Procedure: COLONOSCOPY;  Surgeon: Gatha Mayer, MD;  Location: [place];  Service: Endoscopy;  Laterality: N/A;  . Esophagogastroduodenoscopy N/A 08/11/2013    Procedure: ESOPHAGOGASTRODUODENOSCOPY (EGD);  Surgeon: Gatha Mayer, MD;  Location[facility] END[facility];  Service: Endoscopy;  Laterality: N/A;   Social History:  reports that he has quit smoking. His smoking use included Cigarettes. He has a 7.5 pack-year smoking history. He has never used smokeless tobacco. He reports that he does not drink alcohol or use illicit drugs.  Allergies  Allergen Reactions  . Haldol [Haloperidol Lactate] Palpitations    Sleeps for days    Family History  Problem Relation Age of Onset  . Diabetes Mellitus II Mother   . Diabetes Mellitus II Father   . Lung cancer Sister   . Lung cancer Brother   . CAD Brother      Prior to Admission medications   Medication Sig Start Date End Date Taking? Authorizing Provider  acetaminophen (TYLENOL) 325 MG tablet Take 2 tablets (650 mg total) by mouth every 6 (six) hours as needed for mild pain (or Fever >/= 101). 08/14/13  Yes Kelvin Cellar, MD  atorvastatin (LIPITOR) 10 MG tablet Take 10 mg by mouth daily.  Yes Historical Provider, MD  chlorhexidine (PERIDEX) 0.12 % solution Use as directed 10 mLs in the mouth or throat 3 (three) times daily. Apply to teeth and gums with toothbrush (staff hand over hand)   Yes Historical Provider, MD  cholecalciferol (VITAMIN D) 1000 UNITS tablet Take 2,000 Units by mouth daily.   Yes Historical Provider, MD  esomeprazole (NEXIUM) 40 MG capsule Take 1 capsule (40 mg total) by mouth 2 (two) times daily. 08/18/13  Yes Gatha Mayer, MD  ferrous sulfate (FERROUSUL) 325 (65 FE) MG tablet Take 1  tablet (325 mg total) by mouth 2 (two) times daily. 08/17/13  Yes Ladene Artist, MD  psyllium (METAMUCIL) 58.6 % powder Take 1 packet by mouth daily.   Yes Historical Provider, MD  amoxicillin-clavulanate (AUGMENTIN) 875-125 MG per tablet Take 1 tablet by mouth every 12 (twelve) hours. Completed course of medication a week ago from today (09-02-13) 08/14/13   Kelvin Cellar, MD   Physical Exam: Filed Vitals:   09/02/13 1726  BP: 156/87  Pulse: 99  Temp: 98.2 F (36.8 C)  Resp: 20    BP 156/87  Pulse 99  Temp(Src) 98.2 F (36.8 C) (Oral)  Resp 20  Ht 5\' 7"  (1.702 m)  Wt 46.494 kg (102 lb 8 oz)  BMI 16.05 kg/m2  SpO2 100% Constitutional: Vital signs reviewed.  Patient is a well-developed and cachectic appearing  in no acute distress and cooperative with exam. Alert, nonverbal.  Head: Temporal wasting, Normocephalic and atraumatic Mouth: no erythema or exudates, dry MM Eyes: PERRL, EOMI, conjunctivae normal, No scleral icterus.  Neck: Supple, Trachea midline normal ROM, No JVD, mass, thyromegaly, or carotid bruit present.  Cardiovascular: RRR, S1 normal, S2 normal, no MRG, pulses symmetric and intact bilaterally Pulmonary/Chest: normal respiratory effort, CTAB, no wheezes, rales, or rhonchi Abdominal: Soft. Non-tender, non-distended, bowel sounds are normal, no masses, organomegaly, or guarding present.  Extremities: Lower extremity contractures, +1 edema  Hematology: no cervical, inginal, or axillary adenopathy.  Neurological: Alert, and nonverbal, moving all extremities grossly but lower extremity contractures present. cranial nerve II-XII are grossly intact Skin: Warm, dry and intact. No rash, cyanosis, or clubbing.                 Labs on Admission:  Basic Metabolic Panel: No results found for this basename: NA, K, CL, CO2, GLUCOSE, BUN, CREATININE, CALCIUM, MG, PHOS,  in the last 168 hours Liver Function Tests: No results found for this basename: AST, ALT, ALKPHOS,  BILITOT, PROT, ALBUMIN,  in the last 168 hours No results found for this basename: LIPASE, AMYLASE,  in the last 168 hours No results found for this basename: AMMONIA,  in the last 168 hours CBC: No results found for this basename: WBC, NEUTROABS, HGB, HCT, MCV, PLT,  in the last 168 hours Cardiac Enzymes: No results found for this basename: CKTOTAL, CKMB, CKMBINDEX, TROPONINI,  in the last 168 hours  BNP (last 3 results)  Recent Labs  08/10/13 0415  PROBNP 1887.0*   CBG: No results found for this basename: GLUCAP,  in the last 168 hours  Radiological Exams on Admission: No results found.   Assessment/Plan Present on Admission:  . Dehydration -As discussed above secondary to poor by mouth intake and diarrhea -IV fluids and follow -Please followup on all admission labs(still pending as patient was a direct admit) . Gastric and duodenal ulcers -Place on IV PPI -GI to follow him for further recommendations . Anorexia -Per family he has not  been taking his meds so above/gastric& duodenal ulcers likely still contributing factor -Will obtain UA, chest x-ray, TSH to further evaluate -GI to see as per Dr. Jenne Campus 8/20 note for further recommendations -Follow and consider ultrasound to evaluate gallbladder if above studies negative. .Diarrhea  -Obtain C. difficile and she stool studies and follow  . Iron deficiency anemia, unspecified -Follow and resume iron supplements when clinically appropriate  .Protein-calorie malnutrition, severe -Nutrition consult for further recommendations        Code Status: FULL  Family Communication: 2 sisters at bedside as well as caregiver Disposition Plan: Pending clinical course  Time spent: >10mins  [facility] Pager [phone]

## 2013-09-02 NOTE — Progress Notes (Signed)
49 male with MR to be directly admitted from [place] office to [facility] med surg for minimal PO intake. Not taking pills, water, food.  Discharged from hospitalist service 8/1. Had bleeding gastric and duodenal ulcers as well as pneumonia.  Seen in ED 5 days ago and sent home.  Also some diarrhea.  Per Ms Chester Holstein, GI APP, likely needs repeat EGD, possibly c diff eval. Vitals stable.  [facility] to follow as consultants.  CALL FLOW MANAGER ON ARRIVAL TO UNIT.  Doree Barthel, MD [facility]

## 2013-09-02 NOTE — Patient Instructions (Signed)
Someone from [facility] will be calling Steven [name] at [phone].  You will be admitted to [facility].

## 2013-09-02 NOTE — Progress Notes (Signed)
History of Present Illness:  Patient is a 58 year old mentally retarded male who we saw in the hospital late last month while he was admitted with poor appetite, weight loss and profound anemia with Hemoccult-positive stools. Inpatient  EGD 08/11/13 revealed a large prepyloric ulcer and several other small duodenal bulb ulcers felt to be secondary to diclofenac. Patient subsequently had a colonoscopy which was normal .   The patient is here with his caregivers. According to the primary caregiver patient has not been eating or drinking since hospital discharge, 3 weeks ago.  Patient is vomiting mucoid material 2-3 times a day. Since leaving the hospital he has also had loose stool 2-3 times a day. Stools not particularly malodorous. Patient has not taken any of his home medications in over a week now. Caregiver sent him to the emergency department 08/28/13 but evaluation unremarkable, patient returned to group home.   Past Medical History  Diagnosis Date  . Hyperlipidemia   . Anxiety   . Mental retardation     ue to temp of 108 aftrer having measles age 84.  . Hx of diaper rash   . Edema of foot 02/06/2012    BILATERAL    Past Surgical History  Procedure Laterality Date  . Orif ankle fracture  02/06/2012    RIGHT ANKLE  . Orif ankle fracture  02/06/2012    Procedure: OPEN REDUCTION INTERNAL FIXATION (ORIF) ANKLE FRACTURE;  Surgeon: Wylene Simmer, MD;  Location: [place];  Service: Orthopedics;  Laterality: Right;  . Syndesmosis repair  02/06/2012    Procedure: SYNDESMOSIS REPAIR;  Surgeon: Wylene Simmer, MD;  Location: [place];  Service: Orthopedics;  Laterality: Right;  tri maleolar  . Colonoscopy N/A 08/11/2013    Procedure: COLONOSCOPY;  Surgeon: Gatha Mayer, MD;  Location: [place];  Service: Endoscopy;  Laterality: N/A;  . Esophagogastroduodenoscopy N/A 08/11/2013    Procedure: ESOPHAGOGASTRODUODENOSCOPY (EGD);  Surgeon: Gatha Mayer, MD;  Location: [facility] ENDOSCOPY;  Service: Endoscopy;   Laterality: N/A;   Dg Abd Acute W/chest  08/26/2013   CLINICAL DATA:  Emesis.  Poorly eating.  EXAM: ACUTE ABDOMEN SERIES (ABDOMEN 2 VIEW & CHEST 1 VIEW)  COMPARISON:  Chest x-ray a 08/10/2013. Abdominal radiograph 08/08/2013.  FINDINGS: Lung volumes are low. Slight coarsening of interstitial markings, similar to prior examinations. No consolidative airspace disease. No pleural effusions. No pneumothorax. No pulmonary nodule or mass noted. Pulmonary vasculature and the cardiomediastinal silhouette are within normal limits.  Gas and stool are seen scattered throughout the colon extending to the level of the distal rectum. No pathologic distension of small bowel is noted. No gross evidence of pneumoperitoneum. Multiple nondilated gas-filled loops of small bowel throughout the central abdomen.  IMPRESSION: 1. Nonspecific, nonobstructive bowel gas pattern. 2. No pneumoperitoneum. 3. Low lung volumes without radiographic evidence of acute cardiopulmonary disease.   Electronically Signed   By: Vinnie Langton M.D.   On: 08/26/2013 18:58    Physical Exam: General: white male in wheelchair in no acute distress Head: Normocephalic and atraumatic Eyes:  sclerae anicteric, conjunctiva pink  Lungs: Doesn't participate in exam but no wheezing.  Heart: Tachy at 100 Abdomen: Limited exam in wheelchair. Abdomen soft, nondistended. Hypoactive BS. Psychological:  Alert . Doesn't follow commands.  Assessment and Recommendations:   63. 58 year old mentally retarded male with a recent admission for profound anemia and weight loss. Inpatient EGD 08/11/13 revealed a large prepyloric ulcer and multiple small duodenal bulb ulcers all felt to be related to  Diclofenac.. Now presenting with almost a three week history of nausea and vomiting. He hasn't had anything to eat or drink in several days and won't take home meds. Not sure what is going on but patient will need admission for further evaluation and treatment. [facility] has kindly agreed to admit the patient, we will see follow along. Patient may need repeat endoscopy.   2. Diarrhea, started after recent antibiotics for PNA. Stool for C-diff should be collected upon hospital admission.

## 2013-09-03 ENCOUNTER — Encounter: Payer: Self-pay | Admitting: Nurse Practitioner

## 2013-09-03 ENCOUNTER — Inpatient Hospital Stay (HOSPITAL_COMMUNITY): Payer: Medicare Other

## 2013-09-03 DIAGNOSIS — K279 Peptic ulcer, site unspecified, unspecified as acute or chronic, without hemorrhage or perforation: Secondary | ICD-10-CM | POA: Insufficient documentation

## 2013-09-03 DIAGNOSIS — E860 Dehydration: Secondary | ICD-10-CM

## 2013-09-03 DIAGNOSIS — R112 Nausea with vomiting, unspecified: Secondary | ICD-10-CM | POA: Insufficient documentation

## 2013-09-03 DIAGNOSIS — K257 Chronic gastric ulcer without hemorrhage or perforation: Secondary | ICD-10-CM

## 2013-09-03 DIAGNOSIS — I071 Rheumatic tricuspid insufficiency: Secondary | ICD-10-CM | POA: Diagnosis present

## 2013-09-03 DIAGNOSIS — K2981 Duodenitis with bleeding: Secondary | ICD-10-CM

## 2013-09-03 DIAGNOSIS — E870 Hyperosmolality and hypernatremia: Secondary | ICD-10-CM | POA: Diagnosis present

## 2013-09-03 DIAGNOSIS — R197 Diarrhea, unspecified: Secondary | ICD-10-CM | POA: Insufficient documentation

## 2013-09-03 DIAGNOSIS — D509 Iron deficiency anemia, unspecified: Secondary | ICD-10-CM

## 2013-09-03 DIAGNOSIS — R1310 Dysphagia, unspecified: Secondary | ICD-10-CM | POA: Diagnosis present

## 2013-09-03 DIAGNOSIS — I5189 Other ill-defined heart diseases: Secondary | ICD-10-CM | POA: Diagnosis present

## 2013-09-03 LAB — COMPREHENSIVE METABOLIC PANEL
ALBUMIN: 2.6 g/dL — AB (ref 3.5–5.2)
ALT: 11 U/L (ref 0–53)
AST: 11 U/L (ref 0–37)
Alkaline Phosphatase: 82 U/L (ref 39–117)
Anion gap: 15 (ref 5–15)
BILIRUBIN TOTAL: 0.4 mg/dL (ref 0.3–1.2)
BUN: 11 mg/dL (ref 6–23)
CHLORIDE: 110 meq/L (ref 96–112)
CO2: 24 mEq/L (ref 19–32)
Calcium: 9.4 mg/dL (ref 8.4–10.5)
Creatinine, Ser: 0.43 mg/dL — ABNORMAL LOW (ref 0.50–1.35)
GFR calc Af Amer: >90 mL/min (ref >90)
GFR calc non Af Amer: >90 mL/min (ref >90)
Glucose, Bld: 83 mg/dL (ref 70–99)
Potassium: 3.3 mEq/L — ABNORMAL LOW (ref 3.7–5.3)
Sodium: 149 mEq/L — ABNORMAL HIGH (ref 137–147)
Total Protein: 6 g/dL (ref 6.0–8.3)

## 2013-09-03 LAB — CBC
HEMATOCRIT: 42.3 % (ref 39.0–52.0)
Hemoglobin: 13 g/dL (ref 13.0–17.0)
MCH: 28.1 pg (ref 26.0–34.0)
MCHC: 30.7 g/dL (ref 30.0–36.0)
MCV: 91.4 fL (ref 78.0–100.0)
Platelets: 436 10*3/uL — ABNORMAL HIGH (ref 150–400)
RBC: 4.63 MIL/uL (ref 4.22–5.81)
RDW: 18.9 % — ABNORMAL HIGH (ref 11.5–15.5)
WBC: 6.1 10*3/uL (ref 4.0–10.5)

## 2013-09-03 MED ORDER — SENNOSIDES 8.8 MG/5ML PO SYRP
5.0000 mL | ORAL_SOLUTION | Freq: Every evening | ORAL | Status: DC | PRN
  Filled 2013-09-03: qty 5

## 2013-09-03 MED ORDER — LORAZEPAM 2 MG/ML IJ SOLN
1.0000 mg | Freq: Once | INTRAMUSCULAR | Status: AC
  Administered 2013-09-04: 1 mg via INTRAVENOUS
  Filled 2013-09-03: qty 1

## 2013-09-03 MED ORDER — JEVITY 1.2 CAL PO LIQD
1000.0000 mL | ORAL | Status: DC
  Filled 2013-09-03 (×2): qty 1000

## 2013-09-03 MED ORDER — POLYETHYLENE GLYCOL 3350 17 G PO PACK
17.0000 g | PACK | Freq: Two times a day (BID) | ORAL | Status: DC
  Administered 2013-09-03 – 2013-09-13 (×11): 17 g via ORAL
  Filled 2013-09-03 (×22): qty 1

## 2013-09-03 MED ORDER — JEVITY 1.2 CAL PO LIQD: 1000.0000 mL | ORAL | Status: DC

## 2013-09-03 MED ORDER — SODIUM CHLORIDE 0.9 % IV SOLN
125.0000 mg | Freq: Once | INTRAVENOUS | Status: AC
  Administered 2013-09-03: 125 mg via INTRAVENOUS
  Filled 2013-09-03: qty 10

## 2013-09-03 MED ORDER — POTASSIUM CHLORIDE 2 MEQ/ML IV SOLN
INTRAVENOUS | Status: DC
  Administered 2013-09-03 – 2013-09-06 (×6): via INTRAVENOUS
  Filled 2013-09-03 (×10): qty 1000

## 2013-09-03 MED ORDER — LORAZEPAM 2 MG/ML IJ SOLN
0.5000 mg | Freq: Once | INTRAMUSCULAR | Status: AC
  Administered 2013-09-03: 0.5 mg via INTRAVENOUS

## 2013-09-03 MED ORDER — MAGIC MOUTHWASH W/LIDOCAINE
10.0000 mL | Freq: Four times a day (QID) | ORAL | Status: DC
  Administered 2013-09-07 – 2013-09-10 (×8): 10 mL via ORAL
  Filled 2013-09-03 (×42): qty 10

## 2013-09-03 MED ORDER — LORAZEPAM 2 MG/ML IJ SOLN
INTRAMUSCULAR | Status: AC
  Filled 2013-09-03: qty 1

## 2013-09-03 NOTE — Progress Notes (Signed)
RESTRAINTS WERE NOT NEEDED DUE TO UNSUCCESSFUL NGT ATTEMPTS

## 2013-09-03 NOTE — Progress Notes (Addendum)
INITIAL NUTRITION ASSESSMENT  DOCUMENTATION CODES Per approved criteria  -Severe malnutrition in the context of chronic illness  Pt meets criteria for severe MALNUTRITION in the context of chronic illness as evidenced by 32% weight loss in 5 weeks and reported intake <75% of estimated needs for >1 month. Pt also have severe fat and muscle wasting.  INTERVENTION: Initiate Jevity 1.2 @ 20 ml/hr via NGT and increase by 10 ml every 8 hours to goal rate of 55 ml/hr.   Tube feeding regimen provides 1584 kcal (100% of needs), 73 grams of protein, and 1069 ml of H2O.   Once IV fluid's dc'd. Recommend free water flushes of 175 ml TID to provide additional 125 ml of H2O.   Monitor magnesium, potassium, and phosphorus daily for at least 3 days, MD to replete as needed, as pt is at risk for refeeding syndrome given malnutrition.  NUTRITION DIAGNOSIS: Inadequate oral intake related to inability to swallow as evidenced by weight loss and reported gurgling noise upon swallowing.   Goal: Pt to meet >/= 90% of their estimated nutrition needs   Monitor:  Weight trend, need for TF, labs, diet advancement  Reason for Assessment: Consult for TF initiation and management  58 y.o. 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 - Obtained nutritional history from pt's family. They report that pt usually has a large appetite and eats until the point where they have to stop giving him food. They report that this changed about 2 months ago. They suspect that he is unable to swallow due to hearing gurgling sounds when he tries to eat. They report that pt had a swallow study during last hospital stay which he passed.  - Spoke with RN who said pt was not eating or drinking. She said he spit out his jello and would not drink the coke he asked for. - Pt has lost 49 lbs in less than a month and a half.  Nutrition Focused  Physical Exam:  Subcutaneous Fat:  Orbital Region: severe wasting Upper Arm Region: severe wasting Thoracic and Lumbar Region: n/a  Muscle:  Temple Region: severe wasting Clavicle Bone Region: severe wasting Clavicle and Acromion Bone Region: severe wasting Scapular Bone Region: n/a Dorsal Hand: severe wasting Patellar Region: severe wasting Anterior Thigh Region: severe wasting Posterior Calf Region: severe wasting  Edema: none  Height: Ht Readings from Last 1 Encounters:  09/02/13 5\' 7"  (1.702 m)    Weight: Wt Readings from Last 1 Encounters:  09/02/13 102 lb 8 oz (46.494 kg)    Ideal Body Weight: 66.1  % Ideal Body Weight: 70%  Wt Readings from Last 10 Encounters:  09/02/13 102 lb 8 oz (46.494 kg)  08/12/13 151 lb 0.2 oz (68.5 kg)  08/12/13 151 lb 0.2 oz (68.5 kg)  02/06/12 171 lb 1.2 oz (77.6 kg)  02/06/12 171 lb 1.2 oz (77.6 kg)  01/27/12 185 lb (83.915 kg)    Usual Body Weight: 150 lbs  % Usual Body Weight: 68%  BMI:  Body mass index is 16.05 kg/(m^2).  Estimated Nutritional Needs: Kcal: 1400-1600 Protein: 70-80 g Fluid: >1.6 L/day  Skin: stage II pressure ulcer on left foot  Diet Order: Clear Liquid  EDUCATION NEEDS: -Education not appropriate at this time   Intake/Output Summary (Last 24 hours) at 09/03/13 1227 Last data filed at 09/03/13 0616  Gross per 24 hour  Intake      0 ml  Output     70 ml  Net    -70 ml    Last BM: prior to admission   Labs:   Recent Labs Lab 09/02/13 2143 09/03/13 0605  NA 148* 149*  K 3.6* 3.3*  CL 108 110  CO2 22 24  BUN 13 11  CREATININE 0.44* 0.43*  CALCIUM 9.9 9.4  GLUCOSE 85 83    CBG (last 3)  No results found for this basename: GLUCAP,  in the last 72 hours  Scheduled Meds: . antiseptic oral rinse  7 mL Mouth Rinse BID  . atorvastatin  10 mg Oral Daily  . chlorhexidine  10 mL Mouth/Throat TID  . cholecalciferol  2,000 Units Oral Daily  . enoxaparin (LOVENOX) injection  30 mg  Subcutaneous Q24H  . pantoprazole (PROTONIX) IV  40 mg Intravenous Q12H  . polyethylene glycol  17 g Oral BID    Continuous Infusions: . sodium chloride 100 mL/hr at 09/03/13 1914    Past Medical History  Diagnosis Date  . Hyperlipidemia   . Anxiety   . Mental retardation     ue to temp of 108 aftrer having measles age 24.  . Hx of diaper rash   . Edema of foot 02/06/2012    BILATERAL    Past Surgical History  Procedure Laterality Date  . Orif ankle fracture  02/06/2012    RIGHT ANKLE  . Orif ankle fracture  02/06/2012    Procedure: OPEN REDUCTION INTERNAL FIXATION (ORIF) ANKLE FRACTURE;  Surgeon: Wylene Simmer, MD;  Location: [place];  Service: Orthopedics;  Laterality: Right;  . Syndesmosis repair  02/06/2012    Procedure: SYNDESMOSIS REPAIR;  Surgeon: Wylene Simmer, MD;  Location: [place];  Service: Orthopedics;  Laterality: Right;  tri maleolar  . Colonoscopy N/A 08/11/2013    Procedure: COLONOSCOPY;  Surgeon: Gatha Mayer, MD;  Location: [facility];  Service: Endoscopy;  Laterality: N/A;  . Esophagogastroduodenoscopy N/A 08/11/2013    Procedure: ESOPHAGOGASTRODUODENOSCOPY (EGD);  Surgeon: Gatha Mayer, MD;  Location: [facility] ENDOSCOPY;  Service: Endoscopy;  Laterality: N/A;    Terrace Arabia RD, LDN

## 2013-09-03 NOTE — Progress Notes (Signed)
SPOKE WITH DR. Fuller Plan ABOUT UNSUCCESSFUL NGT ATTEMPTS. HE SAID TO PLACE BY FLURO IN AM.. I SPOKE WITH SISTERS AND TOLD THEM THE PLAN. THEY WERE OKAY WITH THIS PLAN.

## 2013-09-03 NOTE — Consult Note (Signed)
Reason for Consult:dysphagia Referring Physician: Tat  TAKAO LIZER is an 58 y.o. male.  HPI: Patient has not been taking pos for the last 2 days. Patient with MR and not speaking. I was asked to evaluate upper airway for any signs of infection or other reason why patient not swallowing.  Past Medical History  Diagnosis Date  . Hyperlipidemia   . Anxiety   . Mental retardation     ue to temp of 108 aftrer having measles age 76.  . Hx of diaper rash   . Edema of foot 02/06/2012    BILATERAL    Past Surgical History  Procedure Laterality Date  . Orif ankle fracture  02/06/2012    RIGHT ANKLE  . Orif ankle fracture  02/06/2012    Procedure: OPEN REDUCTION INTERNAL FIXATION (ORIF) ANKLE FRACTURE;  Surgeon: Wylene Simmer, MD;  Location: [place];  Service: Orthopedics;  Laterality: Right;  . Syndesmosis repair  02/06/2012    Procedure: SYNDESMOSIS REPAIR;  Surgeon: Wylene Simmer, MD;  Location: [place];  Service: Orthopedics;  Laterality: Right;  tri maleolar  . Colonoscopy N/A 08/11/2013    Procedure: COLONOSCOPY;  Surgeon: Gatha Mayer, MD;  Location: [place];  Service: Endoscopy;  Laterality: N/A;  . Esophagogastroduodenoscopy N/A 08/11/2013    Procedure: ESOPHAGOGASTRODUODENOSCOPY (EGD);  Surgeon: Gatha Mayer, MD;  Location: [facility] ENDOSCOPY;  Service: Endoscopy;  Laterality: N/A;    Social History:  reports that he has quit smoking. His smoking use included Cigarettes. He has a 7.5 pack-year smoking history. He has never used smokeless tobacco. He reports that he does not drink alcohol or use illicit drugs.  Allergies:  Allergies  Allergen Reactions  . Haldol [Haloperidol Lactate] Palpitations    Sleeps for days    Medications: I have reviewed the patient's current medications.  Results for orders placed during the hospital encounter of 09/02/13 (from the past 48 hour(s))  TSH     Status: None   Collection Time    09/02/13  9:00 PM      Result Value Ref Range   TSH 2.380   0.350 - 4.500 uIU/mL  COMPREHENSIVE METABOLIC PANEL     Status: Abnormal   Collection Time    09/02/13  9:43 PM      Result Value Ref Range   Sodium 148 (*) 137 - 147 mEq/L   Potassium 3.6 (*) 3.7 - 5.3 mEq/L   Chloride 108  96 - 112 mEq/L   CO2 22  19 - 32 mEq/L   Glucose, Bld 85  70 - 99 mg/dL   BUN 13  6 - 23 mg/dL   Creatinine, Ser 0.44 (*) 0.50 - 1.35 mg/dL   Calcium 9.9  8.4 - 10.5 mg/dL   Total Protein 6.3  6.0 - 8.3 g/dL   Albumin 2.6 (*) 3.5 - 5.2 g/dL   AST 14  0 - 37 U/L   ALT 10  0 - 53 U/L   Alkaline Phosphatase 82  39 - 117 U/L   Total Bilirubin 0.4  0.3 - 1.2 mg/dL   GFR calc non Af Amer >90  >90 mL/min   GFR calc Af Amer >90  >90 mL/min   Comment: (NOTE)     The eGFR has been calculated using the CKD EPI equation.     This calculation has not been validated in all clinical situations.     eGFR's persistently <90 mL/min signify possible Chronic Kidney     Disease.  Anion gap 18 (*) 5 - 15  CBC     Status: Abnormal   Collection Time    09/02/13  9:43 PM      Result Value Ref Range   WBC 6.7  4.0 - 10.5 K/uL   RBC 4.65  4.22 - 5.81 MIL/uL   Hemoglobin 12.7 (*) 13.0 - 17.0 g/dL   HCT 41.8  39.0 - 52.0 %   MCV 89.9  78.0 - 100.0 fL   MCH 27.3  26.0 - 34.0 pg   MCHC 30.4  30.0 - 36.0 g/dL   RDW 19.0 (*) 11.5 - 15.5 %   Platelets 383  150 - 400 K/uL  COMPREHENSIVE METABOLIC PANEL     Status: Abnormal   Collection Time    09/03/13  6:05 AM      Result Value Ref Range   Sodium 149 (*) 137 - 147 mEq/L   Potassium 3.3 (*) 3.7 - 5.3 mEq/L   Chloride 110  96 - 112 mEq/L   CO2 24  19 - 32 mEq/L   Glucose, Bld 83  70 - 99 mg/dL   BUN 11  6 - 23 mg/dL   Creatinine, Ser 0.43 (*) 0.50 - 1.35 mg/dL   Calcium 9.4  8.4 - 10.5 mg/dL   Total Protein 6.0  6.0 - 8.3 g/dL   Albumin 2.6 (*) 3.5 - 5.2 g/dL   AST 11  0 - 37 U/L   ALT 11  0 - 53 U/L   Alkaline Phosphatase 82  39 - 117 U/L   Total Bilirubin 0.4  0.3 - 1.2 mg/dL   GFR calc non Af Amer >90  >90 mL/min   GFR  calc Af Amer >90  >90 mL/min   Comment: (NOTE)     The eGFR has been calculated using the CKD EPI equation.     This calculation has not been validated in all clinical situations.     eGFR's persistently <90 mL/min signify possible Chronic Kidney     Disease.   Anion gap 15  5 - 15  CBC     Status: Abnormal   Collection Time    09/03/13  6:05 AM      Result Value Ref Range   WBC 6.1  4.0 - 10.5 K/uL   RBC 4.63  4.22 - 5.81 MIL/uL   Hemoglobin 13.0  13.0 - 17.0 g/dL   HCT 42.3  39.0 - 52.0 %   MCV 91.4  78.0 - 100.0 fL   MCH 28.1  26.0 - 34.0 pg   MCHC 30.7  30.0 - 36.0 g/dL   RDW 18.9 (*) 11.5 - 15.5 %   Platelets 436 (*) 150 - 400 K/uL    Portable Chest 1 View  09/03/2013   CLINICAL DATA:  Evaluate for infiltrates  EXAM: PORTABLE CHEST - 1 VIEW  COMPARISON:  09/13/2013  FINDINGS: Suboptimal study due to patient rotation. There is no gross change in the normal heart size and mild aortic tortuosity. There is no edema, consolidation, effusion, or pneumothorax.  IMPRESSION: No evidence of active cardiopulmonary disease.   Electronically Signed   By: Jorje Guild M.D.   On: 09/03/2013 01:05   Dg Ugi W/o Kub  09/03/2013   CLINICAL DATA:  Nausea, vomiting. History of gastric and duodenal ulcers.  EXAM: UPPER GI SERIES WITHOUT KUB  TECHNIQUE: Routine upper GI series was performed with high-density barium.  FLUOROSCOPY TIME:  1 min 47 seconds  COMPARISON:  08/26/2013 radiographs.  FINDINGS: The patient was either unable or would not drink contrast. The patient could not be positioned in the standard supine position. Despite the lateral positioning in the fetal position, I attempted to proceed with this study however the patient would not ingest contrast. In an attempt to stimulate swallowing, 2 mL of thick barium contrast was placed in the patient's mouth. Over the course of 1 min and 30 seconds, the patient did eventually swallow and this tiny bolus was observed to enter the esophagus. At this  point, this study was deemed not feasible and terminated.  IMPRESSION: Aborted upper GI study.  The patient would not drink contrast.  I discussed the case with Dr. Fuller Plan. The patient had a recent endoscopy and the upper GI study is to ensure continued patency. The patient will require an enteric tube and then an upper GI and study could be performed through the enteric tube. Given the patient's positioning, the upper GI study even with a tube will be limited but should be adequate to assess for gross patency of the upper GI tract.   Electronically Signed   By: Dereck Ligas M.D.   On: 09/03/2013 15:04    ROS:not speaking   YF:VCBS exam with thick mucous. No tonsil or peritonsil swelling. No exudate noted FOL was easily passed thru the right nostril. Nasopharynx clear.  BOT clear without exudate or swelling. Epiglottis, TVC clear. Pyriform sinus clear bilteral without pooling.  Assessment/Plan: Clear upper airway exam. No explanation for not taking pos.  NEWMAN, CHRISTOPHER 09/03/2013, 6:55 PM

## 2013-09-03 NOTE — Consult Note (Addendum)
Seen in office 09/02/13. Hospitalist directly admitted patient from our office.    History of Present Illness:  Patient is a 58 year old mentally retarded male who we saw in the hospital late last month while he was admitted with poor appetite, weight loss and profound Fe deficiency anemia with Hemoccult-positive stools. Inpatient EGD 08/11/13 revealed a large prepyloric ulcer and several other small duodenal bulb ulcers felt to be secondary to diclofenac. Patient subsequently had a colonoscopy which was normal .  The patient is here with his caregivers. According to the primary caregiver patient has not been eating or drinking since hospital discharge 3 weeks ago. Patient is vomiting mucoid material 2-3 times a day.Apparently having difficulty swallowing. Since leaving the hospital he has also had loose stool 2-3 times a day. Stools not particularly malodorous. Patient has not taken any of his home medications in over a week now. Unable or unwilling to swallow meds. Caregiver sent him to the emergency department 08/28/13 but evaluation unremarkable, patient returned to group home.   Past Medical History   Diagnosis  Date   .  Hyperlipidemia    .  Anxiety    .  Mental retardation      ue to temp of 108 aftrer having measles age 4.   .  Hx of diaper rash    .  Edema of foot  02/06/2012     BILATERAL    Past Surgical History   Procedure  Laterality  Date   .  Orif ankle fracture   02/06/2012     RIGHT ANKLE   .  Orif ankle fracture   02/06/2012     Procedure: OPEN REDUCTION INTERNAL FIXATION (ORIF) ANKLE FRACTURE; Surgeon: Wylene Simmer, MD; Location: [place]; Service: Orthopedics; Laterality: Right;   .  Syndesmosis repair   02/06/2012     Procedure: SYNDESMOSIS REPAIR; Surgeon: Wylene Simmer, MD; Location: [place]; Service: Orthopedics; Laterality: Right; tri maleolar   .  Colonoscopy  N/A  08/11/2013     Procedure: COLONOSCOPY; Surgeon: Gatha Mayer, MD; Location: [place]; Service: Endoscopy;  Laterality: N/A;   .  Esophagogastroduodenoscopy  N/A  08/11/2013     Procedure: ESOPHAGOGASTRODUODENOSCOPY (EGD); Surgeon: Gatha Mayer, MD; Location: [facility] ENDOSCOPY; Service: Endoscopy; Laterality: N/A;    Dg Abd Acute W/chest  08/26/2013 CLINICAL DATA: Emesis. Poorly eating. EXAM: ACUTE ABDOMEN SERIES (ABDOMEN 2 VIEW & CHEST 1 VIEW) COMPARISON: Chest x-ray a 08/10/2013. Abdominal radiograph 08/08/2013. FINDINGS: Lung volumes are low. Slight coarsening of interstitial markings, similar to prior examinations. No consolidative airspace disease. No pleural effusions. No pneumothorax. No pulmonary nodule or mass noted. Pulmonary vasculature and the cardiomediastinal silhouette are within normal limits. Gas and stool are seen scattered throughout the colon extending to the level of the distal rectum. No pathologic distension of small bowel is noted. No gross evidence of pneumoperitoneum. Multiple nondilated gas-filled loops of small bowel throughout the central abdomen. IMPRESSION: 1. Nonspecific, nonobstructive bowel gas pattern. 2. No pneumoperitoneum. 3. Low lung volumes without radiographic evidence of acute cardiopulmonary disease. Electronically Signed By: Vinnie Langton M.D. On: 08/26/2013 18:58   Physical Exam:  General: white male in wheelchair in no acute distress  Head: Normocephalic and atraumatic  Eyes: sclerae anicteric, conjunctiva pink  Lungs: Doesn't participate in exam but no wheezing.  Heart: Tachy at 100  Abdomen: Limited exam in wheelchair. Abdomen soft, nondistended. Hypoactive BS.  Psychological: Alert . Doesn't follow commands.   Assessment and Recommendations:   48. 58 year old mentally retarded male  with a recent admission for profound Fe deficiency anemia and weight loss. Inpatient EGD 08/11/13 revealed a large prepyloric ulcer and multiple small duodenal bulb ulcers all felt to be related to Diclofenac. Now presenting with almost a three week history of dysphagia, nausea,  vomiting and persistent weight loss. He hasn't had anything to eat or drink in several days and won't take home meds. Not sure what is going on but patient will need admission for further evaluation and treatment. [facility] has kindly agreed to admit the patient, we will see follow along. Dehydration suspected. Patient may need repeat endoscopy or UGI series. Speech pathology evaluation likely needed.  2. Diarrhea, started after recent antibiotics for PNA. Stool for C-diff should be collected upon hospital admission.

## 2013-09-03 NOTE — Progress Notes (Signed)
Received a call from radiology, Dr. Brent General.  Patient refused to cooperation with UGI study.  Patient refused to swallow any of contrast.  As a result the study was terminated.  Steven [name] [phone]

## 2013-09-03 NOTE — Progress Notes (Signed)
PROGRESS NOTE  Steven [name] TDS:[id number] DOB: 03/28/1955 DOA: 09/02/2013 PCP: [name], MD  Steven [name] is a 58 y.o. male with past medical history significant for MR and other medical problems as listed below, recently discharged from hospital on 8/1 following similar presentation with poor by mouth intake, weight loss>> was found to have a large ulcer in the prepyloric region of the stomach and multiple small medium sized non-bleeding ulcers were found in the duodenal bulb. Colonoscopy was negative. Patient  also had pneumonia. He is mostly non-verbal per his sister's and caregiver were the source of the history. His caregiver states that about a week ago he was brought back to the ED for nausea vomiting, and following evaluation and treatment in the ED he was discharged back home. She states that since then patient has had very poor by mouth intake-maybe a couple of bites the day and that he's not been taking his medications as he spits them out. He was seen in the GI office today for followup visit, and directly admitted for further evaluation and management. Per caregiver is no cough, fevers, bleeding or nausea vomiting noted.    Assessment/Plan:  Significant dehydration with hypernatremia Due to little or no PO intake. Placed on D5 1/2NS. NG tube placement attempted by RN--pt pulled out once and met resistance second try -Asked IR to try to place under fluoro Am bmet  Inability to swallow / suspected Odynaphagia? Per history given by the family, this has become progressively worse.   will not swallow food but immediately spits it back out. GI on board.   Attempted UGI Series today - but unable to swallow contrast. Place N/G and re-attempt UGI series with contrast given thru N/G Repeat clinical swallow eval.  Family very angry (feel not enough is being done).   -ENT--Dr. [name] performed  laryngoscopy which was negative for any abscess or obstruction -Dr. [name]  also attempted to place NG tube laryngoscope, but met resistance Adding magic mouthwash - possible thrush. -Requested to place nasogastric tube under fluoroscopy Family wants a PEG if we are unable to restore his ability to swallow.  Gastric and duodenal ulcerations found during his 08/07/2013 hospitalization (EGD) On BID PPI IV.  Bx negative for H pylori. NSAIDS stopped.  Severe Malnutrition/failure to thrive. Patient has lost 32% of his body weight in 5 weeks! Appreciate nutrition consultation Initiating Tube Feeds after upper GI study is done -TSH 2.380 -Serum B12--1109 -A.m. cortisol 23.5  Diarrhea? Non recently per family. Stool studies are pending but have yet to be collected. Doubt c-diff given normal WBC and no stools.  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   Patient currently dehydrated.  Iron deficiency  -Given a dose of IV Nulecit -difficult situation due to pt's chronic constipation to give po iron  Social:  Family is angry at their loved one's predicament.  They were somewhat hostile with this provider at bedside.  Unit Director is involved.  Full support being provided.   DVT Prophylaxis:  lovenox  Code Status: full Family Communication: spoke with two guardians (sisters) at bedside. Disposition Plan: inpatient.   Consultants:  GI  ENT  Procedures:  None.  Antibiotics: Anti-infectives   None        HPI/Subjective: Patient unable to communicate.  Non verbal.  Objective: Filed Vitals:   09/02/13 1726 09/02/13 2318 09/03/13 0640 09/03/13 1525  BP: 156/87 143/79 146/79 116/66  Pulse: 99 89 83 79  Temp: 98.2 F (36.8  C) 97.7 F (36.5 C) 98.3 F (36.8 C) 97.6 F (36.4 C)  TempSrc: Oral Oral Oral Oral  Resp: _0 Height: _1  (1.702 m)     Weight: 46.494 kg (102 lb 8 oz)     SpO2: 100% 97% 97% 96%    Intake/Output Summary (Last 24 hours) at 09/03/13 1603 Last data filed at 09/03/13 1534  Gross per 24 hour    Intake      0 ml  Output    520 ml  Net   -520 ml   Filed Weights   09/02/13 1726  Weight: 46.494 kg (102 lb 8 oz)    Exam: General: thin, contracted extremities, non verbal. Lying in the fetal position in bed. HEENT:  PERR, EOMI, Anicteic Sclera, dry mucous membrane.  White coating on tongue (contrast vs thrush).  Temples sunken.  Poor dentition Neck: Supple, no JVD, no masses  Cardiovascular: RRR, S1 S2 auscultated, no rubs, murmurs or gallops.   Respiratory: Clear to auscultation bilaterally with equal chest rise  Abdomen: cachectically thin, soft, nt, nd, +bs. Extremities: contracted. warm dry without cyanosis clubbing or edema.  Skin: Without rashes exudates or nodules.   Psych: non verbal.  Appears to have severe MR.  Calm.       Data Reviewed: Basic Metabolic Panel:  Recent Labs Lab 09/02/13 2143 09/03/13 0605  NA 148* 149*  K 3.6* 3.3*  CL 108 110  CO2 22 24  GLUCOSE 85 83  BUN 13 11  CREATININE 0.44* 0.43*  CALCIUM 9.9 9.4   Liver Function Tests:  Recent Labs Lab 09/02/13 2143 09/03/13 0605  AST 14 11  ALT 10 11  ALKPHOS 82 82  BILITOT 0.4 0.4  PROT 6.3 6.0  ALBUMIN 2.6* 2.6*   CBC:  Recent Labs Lab 09/02/13 2143 09/03/13 0605  WBC 6.7 6.1  HGB 12.7* 13.0  HCT 41.8 42.3  MCV 89.9 91.4  PLT 383 436*   BNP (last 3 results)  Recent Labs  08/10/13 0415  PROBNP 1887.0*    Studies: Portable Chest 1 View  09/03/2013   CLINICAL DATA:  Evaluate for infiltrates  EXAM: PORTABLE CHEST - 1 VIEW  COMPARISON:  09/13/2013  FINDINGS: Suboptimal study due to patient rotation. There is no gross change in the normal heart size and mild aortic tortuosity. There is no edema, consolidation, effusion, or pneumothorax.  IMPRESSION: No evidence of active cardiopulmonary disease.   Electronically Signed   By: Jorje Guild M.D.   On: 09/03/2013 01:05   Dg Ugi W/o Kub  09/03/2013   CLINICAL DATA:  Nausea, vomiting. History of gastric and duodenal  ulcers.  EXAM: UPPER GI SERIES WITHOUT KUB  TECHNIQUE: Routine upper GI series was performed with high-density barium.  FLUOROSCOPY TIME:  1 min 47 seconds  COMPARISON:  08/26/2013 radiographs.  FINDINGS: The patient was either unable or would not drink contrast. The patient could not be positioned in the standard supine position. Despite the lateral positioning in the fetal position, I attempted to proceed with this study however the patient would not ingest contrast. In an attempt to stimulate swallowing, 2 mL of thick barium contrast was placed in the patient's mouth. Over the course of 1 min and 30 seconds, the patient did eventually swallow and this tiny bolus was observed to enter the esophagus. At this point, this study was deemed not feasible and terminated.  IMPRESSION: Aborted upper GI study.  The patient would not drink contrast.  I discussed the case with Dr. Fuller Plan. The patient had a recent endoscopy and the upper GI study is to ensure continued patency. The patient will require an enteric tube and then an upper GI and study could be performed through the enteric tube. Given the patient's positioning, the upper GI study even with a tube will be limited but should be adequate to assess for gross patency of the upper GI tract.   Electronically Signed   By: Dereck Ligas M.D.   On: 09/03/2013 15:04    Scheduled Meds: . antiseptic oral rinse  7 mL Mouth Rinse BID  . atorvastatin  10 mg Oral Daily  . chlorhexidine  10 mL Mouth/Throat TID  . cholecalciferol  2,000 Units Oral Daily  . enoxaparin (LOVENOX) injection  30 mg Subcutaneous Q24H  . ferric gluconate (FERRLECIT/NULECIT) IV  125 mg Intravenous Once  . magic mouthwash w/lidocaine  10 mL Oral QID  . pantoprazole (PROTONIX) IV  40 mg Intravenous Q12H  . polyethylene glycol  17 g Oral BID   Continuous Infusions: . dextrose 5 % and 0.45% NaCl 1,000 mL with potassium chloride 20 mEq infusion      Principal Problem:   Odynophagia Active  Problems:   Protein-calorie malnutrition, severe   Iron deficiency anemia, unspecified   Gastric ulcer, chronic   Dehydration   Hypernatremia   Diastolic dysfunction   Tricuspid regurgitation    Karen Kitchens  [facility] Pager [phone]. If 7PM-7AM, please contact night-coverage at [email or web address], password [facility] 09/03/2013, 4:03 PM  LOS: 1 day   Attending  Patient was seen, examined,treatment plan was discussed with the Physician extender. I have directly reviewed the clinical findings, lab, imaging studies and management of this patient in detail. I have made the necessary changes to the above noted documentation, and agree with the documentation, as recorded by the Physician extender.  Orson Eva, DO [phone]

## 2013-09-03 NOTE — Progress Notes (Signed)
Unsuccessful attempt of NGT placement x3 with 2 other nurses. Dr. Carles Collet notified.

## 2013-09-03 NOTE — Progress Notes (Addendum)
Daily Rounding Note  09/03/2013, 9:08 AM  LOS: 1 day   SUBJECTIVE:       No BMs.  NPO and no vomiting.  Pt asking staff for Coke.   OBJECTIVE:         Vital signs in last 24 hours:    Temp:  [97.7 F (36.5 C)-98.3 F (36.8 C)] 98.3 F (36.8 C) (08/21 0640) Pulse Rate:  [83-100] 83 (08/21 0640) Resp:  [18-20] 18 (08/21 0640) BP: (130-156)/(79-90) 146/79 mmHg (08/21 0640) SpO2:  [97 %-100 %] 97 % (08/21 0640) Weight:  [46.494 kg (102 lb 8 oz)] 46.494 kg (102 lb 8 oz) (08/20 1726) Last BM Date: 09/02/13 General: relaxed, contractured developmentally challenged WM.  Not diaphoretic Heart: RRR.  Chest: clear bil.  No cough or labored breathing Abdomen: soft, hypoactive BS, NT, ND  Extremities: contractured, no swelling.  Muscle atrophy Neuro/Psych:  Cooperative, non-verbal.  Follows simple commands.   Intake/Output from previous day: 08/20 0701 - 08/21 0700 In: -  Out: 70 [Urine:70]  Intake/Output this shift:    Lab Results:  Recent Labs  09/02/13 2143 09/03/13 0605  WBC 6.7 6.1  HGB 12.7* 13.0  HCT 41.8 42.3  PLT 383 436*   BMET  Recent Labs  09/02/13 2143 09/03/13 0605  NA 148* 149*  K 3.6* 3.3*  CL 108 110  CO2 22 24  GLUCOSE 85 83  BUN 13 11  CREATININE 0.44* 0.43*  CALCIUM 9.9 9.4   LFT  Recent Labs  09/02/13 2143 09/03/13 0605  PROT 6.3 6.0  ALBUMIN 2.6* 2.6*  AST 14 11  ALT 10 11  ALKPHOS 82 82  BILITOT 0.4 0.4   PT/INR No results found for this basename: LABPROT, INR,  in the last 72 hours Hepatitis Panel No results found for this basename: HEPBSAG, HCVAB, HEPAIGM, HEPBIGM,  in the last 72 hours  Studies/Results: Portable Chest 1 View  09/03/2013   CLINICAL DATA:  Evaluate for infiltrates  EXAM: PORTABLE CHEST - 1 VIEW  COMPARISON:  09/13/2013  FINDINGS: Suboptimal study due to patient rotation. There is no gross change in the normal heart size and mild aortic tortuosity.  There is no edema, consolidation, effusion, or pneumothorax.  IMPRESSION: No evidence of active cardiopulmonary disease.   Electronically Signed   By: Jorje Guild M.D.   On: 09/03/2013 01:05    ASSESMENT:   *  Anorexia, dysphagia, vomiting, loose stools since discharge 08/14/13. Unable to take po adequately for > one week.  Work up for fe deficiency anemia, anorexia 07/2013: EGD 08/11/13 with large prepyloric ulcer and several other small duodenal bulb ulcers felt to be secondary to diclofenac. Colonoscopy 2/29 (after 3 days of bowel prep) was normal Pathology:  Acute/chronic gastritis-negative for H pylori.   Home on BID Nexium, Miralax, Psyllium. Ferrous sulfate bid added 8/5, had been left off list of meds at discharge.   Diclofenac discontinued.  C diff testing is ordered, not yet collected.   *  Hx anemia.  Not currently anemic   *  Hypokalemia.   *  Mentally retarded.     PLAN   *  Try to get stool sample.  Pt can have clears and is asking for Coke, so can mix Miralax with Coke.  This way we can get stool spec to rule out C diff and manage constipation. *  Going forward, may be best to avoid po Iron given severe constipation is generally  the norm for this pt.  Stopped this for now along with Psyllium, which will be counterproductive if given in setting of poor po intake.  He is iron deficient (ferritin 23, Iron <10) so would use parenteral iron.  *  2 view abdomen, assess for GOO/ileus.  *  SPL evaluation.   Steven [name]  09/03/2013, 9:08 AM Pager: [phone]      Attending physician's note   I have taken an interval history, reviewed the chart and examined the patient. I agree with the Advanced Practitioner's note, impression and recommendations. Etiology of his dysphagia, anorexia and intermittent vomiting is not clear. Continue PPI BID for now. Check abdominal films today and schedule UGI series to evaluate for partial obstruction from ulcer disease. Avoid PO Fe due to  potential side effects. Would proceed with IV Fe replacement in hospital. Long term laxative regimen with daily MOM or Miralax. SLP evaluation.  Pricilla Riffle. Fuller Plan, MD [facility]

## 2013-09-04 ENCOUNTER — Inpatient Hospital Stay (HOSPITAL_COMMUNITY): Payer: Medicare Other

## 2013-09-04 DIAGNOSIS — E870 Hyperosmolality and hypernatremia: Secondary | ICD-10-CM

## 2013-09-04 DIAGNOSIS — E43 Unspecified severe protein-calorie malnutrition: Secondary | ICD-10-CM

## 2013-09-04 DIAGNOSIS — R1310 Dysphagia, unspecified: Secondary | ICD-10-CM

## 2013-09-04 LAB — CBC
HEMATOCRIT: 40.8 % (ref 39.0–52.0)
HEMOGLOBIN: 12.9 g/dL — AB (ref 13.0–17.0)
MCH: 28.7 pg (ref 26.0–34.0)
MCHC: 31.6 g/dL (ref 30.0–36.0)
MCV: 90.7 fL (ref 78.0–100.0)
Platelets: 335 10*3/uL (ref 150–400)
RBC: 4.5 MIL/uL (ref 4.22–5.81)
RDW: 18.9 % — ABNORMAL HIGH (ref 11.5–15.5)
WBC: 9.2 10*3/uL (ref 4.0–10.5)

## 2013-09-04 LAB — BASIC METABOLIC PANEL
ANION GAP: 13 (ref 5–15)
BUN: 5 mg/dL — ABNORMAL LOW (ref 6–23)
CHLORIDE: 110 meq/L (ref 96–112)
CO2: 22 mEq/L (ref 19–32)
Calcium: 9 mg/dL (ref 8.4–10.5)
Creatinine, Ser: 0.38 mg/dL — ABNORMAL LOW (ref 0.50–1.35)
GFR calc Af Amer: >90 mL/min (ref >90)
GFR calc non Af Amer: >90 mL/min (ref >90)
GLUCOSE: 131 mg/dL — AB (ref 70–99)
Potassium: 3.8 mEq/L (ref 3.7–5.3)
SODIUM: 145 meq/L (ref 137–147)

## 2013-09-04 LAB — URINE CULTURE: Colony Count: >=100000

## 2013-09-04 LAB — URINALYSIS, ROUTINE W REFLEX MICROSCOPIC
BILIRUBIN URINE: NEGATIVE
Glucose, UA: NEGATIVE mg/dL
Hgb urine dipstick: NEGATIVE
Ketones, ur: 40 mg/dL — AB
Leukocytes, UA: NEGATIVE
Nitrite: NEGATIVE
PROTEIN: NEGATIVE mg/dL
SPECIFIC GRAVITY, URINE: 1.019 (ref 1.005–1.030)
UROBILINOGEN UA: 0.2 mg/dL (ref 0.0–1.0)
pH: 6 (ref 5.0–8.0)

## 2013-09-04 LAB — CK: Total CK: 49 U/L (ref 7–232)

## 2013-09-04 NOTE — Clinical Social Work Psychosocial (Signed)
Clinical Social Work Department BRIEF PSYCHOSOCIAL ASSESSMENT 09/04/2013  Patient:  Steven [name], Steven [name]     Account Number:  [id number]     Admit date:  09/02/2013  Clinical Social Worker:  Hubert Azure  Date/Time:  09/04/2013 06:35 PM  Referred by:  CSW  Date Referred:  09/04/2013 Referred for  Other - See comment   Other Referral:   From [place] type:  Family Other interview type:    PSYCHOSOCIAL DATA Living Status:  FACILITY Admitted from facility:  Other Level of care:  Group Home Primary support name:  Brenda/Nancy [phone]) Primary support relationship to patient:  SIBLING Degree of support available:   Good    CURRENT CONCERNS Current Concerns  Post-Acute Placement   Other Concerns:    SOCIAL WORK ASSESSMENT / PLAN CSW met with patient's sisters Steven [name] and Steven [name]) who were present at bedside. CSW introduced self and explained role. Group home program manager and patent's caregiver Azzie Roup) was also present at bedside. Patient has been at [facility] for over 20 years. Per program manager patient is to return to home.   Assessment/plan status:  Psychosocial Support/Ongoing Assessment of Needs Other assessment/ plan:   Information/referral to community resources:    PATIENT'S/FAMILY'S RESPONSE TO PLAN OF CARE: Patient's sister were cooperative. Plan is for patient to return to group home when medically stable, unless other recommendation is made. CSW will assist.   Carrington Clamp, [place] Weekend Clinical Social Worker [phone]

## 2013-09-04 NOTE — Progress Notes (Signed)
PROGRESS NOTE  Steven [name] ZOX:[id number] DOB: December 11, 1955 DOA: 09/02/2013 PCP: [name], MD  Assessment/Plan: Significant dehydration with hypernatremia  Due to little or no PO intake.  Placed on D5 1/2NS.  NG tube placement attempted by RN--pt pulled out once and met resistance second try  -Asked IR to try to place NG under fluoro and inject dye to r/o GOO--spoke with Dr. Clovis Riley -plan to leave NG tube in place for enteral feeding until final decision is made for gastrostomy tube  Inability to swallow / suspected Odynaphagia? Vs obstruction  Per history given by the family, this has become progressively worse.  will not swallow food but immediately spits it back out.   Attempted UGI Series 8/21 - but unable to swallow contrast.  Place N/G and re-attempt UGI series with contrast given thru N/G  Repeat clinical swallow eval.  Family very angry (feel not enough is being done).  -ENT--Dr. [name] performed laryngoscopy which was negative for any abscess or obstruction  -Dr. [name] also attempted to place NG tube laryngoscope, but met resistance  Adding magic mouthwash - possible thrush.  Family wants a PEG if we are unable to restore his ability to swallow.  Gastric and duodenal ulcerations found during his 08/07/2013 hospitalization (EGD)  On BID PPI IV. Bx negative for H pylori.  NSAIDS stopped.  MRI of brain without contrast Severe Malnutrition/failure to thrive.  Patient has lost 32% of his body weight in 5 weeks!  Appreciate nutrition consultation  Initiating Tube Feeds after upper GI study is done  -TSH 2.380  -Serum B12--1109  -A.m. cortisol 23.5  Diarrhea?  Non recently per family.  Stool studies are pending but have yet to be collected.  Doubt c-diff given normal WBC and no stools.  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  Patient currently dehydrated.  Iron deficiency  -Given a dose of IV Nulecit  -difficult situation due to pt's  chronic constipation to give po iron   Family Communication:  Sisters updated at bedside--time spent >2min Disposition Plan:   Group home when medically stable         Procedures/Studies: Dg Chest 2 View  08/07/2013   CLINICAL DATA:  Rule out infection  EXAM: CHEST  2 VIEW  COMPARISON:  01/27/2012.  FINDINGS: Low volume film with airspace disease in the right mid lung compatible with pneumonia. Left lung is clear. Left upper lobe nodular density seen on the previous study is not evident on today's exam. The cardio pericardial silhouette is enlarged. No edema or pleural effusion. Bones are diffusely demineralized.  IMPRESSION: Right midlung airspace disease, suspicious for pneumonia.   Electronically Signed   By: Misty Stanley M.D.   On: 08/07/2013 21:04   Dg Ankle 2 Views Left  08/13/2013   CLINICAL DATA:  Left ankle pain  EXAM: LEFT ANKLE - 2 VIEW  COMPARISON:  None  FINDINGS: Three views of the left ankle submitted. No acute fracture or subluxation. No radiopaque foreign body.  IMPRESSION: Negative.   Electronically Signed   By: Lahoma Crocker M.D.   On: 08/13/2013 16:09   Portable Chest 1 View  09/03/2013   CLINICAL DATA:  Evaluate for infiltrates  EXAM: PORTABLE CHEST - 1 VIEW  COMPARISON:  09/13/2013  FINDINGS: Suboptimal study due to patient rotation. There is no gross change in the normal heart size and mild aortic tortuosity. There is no edema, consolidation, effusion, or pneumothorax.  IMPRESSION:  No evidence of active cardiopulmonary disease.   Electronically Signed   By: Jorje Guild M.D.   On: 09/03/2013 01:05   Dg Chest Port 1 View  08/10/2013   CLINICAL DATA:  Dyspnea  EXAM: PORTABLE CHEST - 1 VIEW  COMPARISON:  08/09/2013  FINDINGS: A left-sided central venous line and nasogastric catheter are again noted in satisfactory position. The cardiac shadow remains enlarged. Patient is significantly rotated to the right accentuating the mediastinal markings. Bibasilar infiltrative  changes are again seen slightly worse on the right than the left. No acute bony abnormality is noted.  IMPRESSION: Persistent bibasilar changes.   Electronically Signed   By: Inez Catalina M.D.   On: 08/10/2013 12:59   Dg Chest Port 1 View  08/09/2013   CLINICAL DATA:  Decreased O2 saturation.  EXAM: PORTABLE CHEST - 1 VIEW 01:03 a.m.  COMPARISON:  08/08/2013 and 08/07/2013  FINDINGS: The patient has developed and increased infiltrate at the right lung base with new atelectasis and an infiltrate in the left perihilar region as well as new small right pleural effusion. I suspect the findings represent pulmonary edema.  NG tube tip is below the diaphragm. Central venous catheter tip is at the cavoatrial junction.  IMPRESSION: Progressive infiltrate at the right base with new small right effusion.  New atelectasis in faint infiltrate in the left. I suspect the findings represent pulmonary edema.   Electronically Signed   By: [name] M.D.   On: 08/09/2013 01:26   Dg Chest Port 1 View  08/08/2013   CLINICAL DATA:  Central line placement.  EXAM: PORTABLE CHEST - 1 VIEW  COMPARISON:  Chest radiograph performed 08/07/2013  FINDINGS: The patient's left subclavian line is seen ending about the cavoatrial junction.  Lung expansion is improved, though evaluation is suboptimal due to patient rotation. Patchy right-sided airspace opacities raise concern for pneumonia, perhaps slightly more confluent than on the prior study. The left lung appears clear. No pleural effusion or pneumothorax is seen.  The cardiomediastinal silhouette is normal in size. No acute osseous abnormalities are identified.  IMPRESSION: 1. Left subclavian line seen ending about the cavoatrial junction. 2. Patchy right-sided airspace opacities raise concern for pneumonia, perhaps slightly more confluent than on the prior study.   Electronically Signed   By: Garald Balding M.D.   On: 08/08/2013 01:49   Dg Abd Acute W/chest  08/26/2013   CLINICAL  DATA:  Emesis.  Poorly eating.  EXAM: ACUTE ABDOMEN SERIES (ABDOMEN 2 VIEW & CHEST 1 VIEW)  COMPARISON:  Chest x-ray a 08/10/2013. Abdominal radiograph 08/08/2013.  FINDINGS: Lung volumes are low. Slight coarsening of interstitial markings, similar to prior examinations. No consolidative airspace disease. No pleural effusions. No pneumothorax. No pulmonary nodule or mass noted. Pulmonary vasculature and the cardiomediastinal silhouette are within normal limits.  Gas and stool are seen scattered throughout the colon extending to the level of the distal rectum. No pathologic distension of small bowel is noted. No gross evidence of pneumoperitoneum. Multiple nondilated gas-filled loops of small bowel throughout the central abdomen.  IMPRESSION: 1. Nonspecific, nonobstructive bowel gas pattern. 2. No pneumoperitoneum. 3. Low lung volumes without radiographic evidence of acute cardiopulmonary disease.   Electronically Signed   By: Vinnie Langton M.D.   On: 08/26/2013 18:58   Dg Abd Portable 1v  08/08/2013   CLINICAL DATA:  NG tube placement.  EXAM: PORTABLE ABDOMEN - 1 VIEW  COMPARISON:  None.  FINDINGS: An NG tube is identified with tip overlying  the proximal-mid stomach.  The bowel gas pattern is unremarkable.  IMPRESSION: NG tube with tip overlying the proximal -mid stomach.   Electronically Signed   By: [name] M.D.   On: 08/08/2013 15:55   Dg Ugi W/o Kub  09/03/2013   CLINICAL DATA:  Nausea, vomiting. History of gastric and duodenal ulcers.  EXAM: UPPER GI SERIES WITHOUT KUB  TECHNIQUE: Routine upper GI series was performed with high-density barium.  FLUOROSCOPY TIME:  1 min 47 seconds  COMPARISON:  08/26/2013 radiographs.  FINDINGS: The patient was either unable or would not drink contrast. The patient could not be positioned in the standard supine position. Despite the lateral positioning in the fetal position, I attempted to proceed with this study however the patient would not ingest contrast. In an  attempt to stimulate swallowing, 2 mL of thick barium contrast was placed in the patient's mouth. Over the course of 1 min and 30 seconds, the patient did eventually swallow and this tiny bolus was observed to enter the esophagus. At this point, this study was deemed not feasible and terminated.  IMPRESSION: Aborted upper GI study.  The patient would not drink contrast.  I discussed the case with Dr. Fuller Plan. The patient had a recent endoscopy and the upper GI study is to ensure continued patency. The patient will require an enteric tube and then an upper GI and study could be performed through the enteric tube. Given the patient's positioning, the upper GI study even with a tube will be limited but should be adequate to assess for gross patency of the upper GI tract.   Electronically Signed   By: Dereck Ligas M.D.   On: 09/03/2013 15:04   Dg Swallowing Func-speech Pathology  08/12/2013   Katherene Ponto Deblois, CCC-SLP     08/12/2013  2:59 PM Objective Swallowing Evaluation: Modified Barium Swallowing Study   Patient Details  Name: Steven [name] MRN: [id number] Date of Birth: 09-Jul-1955  Today's Date: 08/12/2013 Time: 1305-1340 SLP Time Calculation (min): 35 min  Past Medical History:  Past Medical History  Diagnosis Date  . Hyperlipidemia   . Anxiety   . Mental retardation     ue to temp of 108 aftrer having measles age 63.  . Hx of diaper rash   . Edema of foot 02/06/2012    BILATERAL   Past Surgical History:  Past Surgical History  Procedure Laterality Date  . Orif ankle fracture  02/06/2012    RIGHT ANKLE  . Orif ankle fracture  02/06/2012    Procedure: OPEN REDUCTION INTERNAL FIXATION (ORIF) ANKLE  FRACTURE;  Surgeon: Wylene Simmer, MD;  Location: [facility];  Service:  Orthopedics;  Laterality: Right;  . Syndesmosis repair  02/06/2012    Procedure: SYNDESMOSIS REPAIR;  Surgeon: Wylene Simmer, MD;   Location: [facility];  Service: Orthopedics;  Laterality: Right;  tri  maleolar  . Colonoscopy N/A 08/11/2013    Procedure:  COLONOSCOPY;  Surgeon: Gatha Mayer, MD;   Location: [facility];  Service: Endoscopy;  Laterality: N/A;  . Esophagogastroduodenoscopy N/A 08/11/2013    Procedure: ESOPHAGOGASTRODUODENOSCOPY (EGD);  Surgeon: Gatha Mayer, MD;  Location: [facility] ENDOSCOPY;  Service: Endoscopy;   Laterality: N/A;   HPI:  58 year old male with a history of mental retardation,  hyperlipidemia was taken to urgent care because of poor by mouth  intake. Apparently the patient has lost a significant amount of  weight over the last few weeks. The patient was transferred to  the ED  and noted to be hypotensive and have leukocytosis with WBC  18.1.  He was seen by GI, determined to have a bleed and EGD  showed large ulcers, suspected to be caused by NSAID.  The  patient was started on intravenous antibiotics for sepsis  secondary to healthcare associated pneumonia and possible  aspiration (right base infiltrate).      Assessment / Plan / Recommendation Clinical Impression  Dysphagia Diagnosis: Mild oral phase dysphagia;Mild pharyngeal  phase dysphagia Clinical impression: Pt presents with a mild oral dysphagia with  tongue pumping, anterior mastication due to missing posterior  dentition. Is able to manage soft solids well. Pt does have a  delayed swallow response, though with consecutive boluses pt is  able to maintain hyolaryngeal elevation and airway protection.  There was one instance of trace penetration above the cords with  mild pyriform sinuses residuals present post swallow with large  sips. At this time, aspiration risk appears quite low. Pt is  recommended to consume mechanical soft diet with thin liquids  with normal PO intake habits (self feeding though somewhat  impulsive). If family notices possible signs of aspiration,  recommend full supervision with POs for small single sips,  restricting straws. No SLP f/u needed at this time.     Treatment Recommendation  No treatment recommended at this time    Diet Recommendation Dysphagia  3 (Mechanical Soft);Thin liquid   Liquid Administration via: Cup;Straw Medication Administration: Whole meds with puree Supervision: Staff to assist with self feeding Compensations: Slow rate;Small sips/bites Postural Changes and/or Swallow Maneuvers: Seated upright 90  degrees    Other  Recommendations Oral Care Recommendations: Oral care BID   Follow Up Recommendations  Other (comment) (group home)    Frequency and Duration        Pertinent Vitals/Pain NA    SLP Swallow Goals     General HPI: 58 year old male with a history of mental  retardation, hyperlipidemia was taken to urgent care because of  poor by mouth intake. Apparently the patient has lost a  significant amount of weight over the last few weeks. The patient  was transferred to the ED and noted to be hypotensive and have  leukocytosis with WBC 18.1.  He was seen by GI, determined to  have a bleed and EGD showed large ulcers, suspected to be caused  by NSAID.  The patient was started on intravenous antibiotics for  sepsis secondary to healthcare associated pneumonia and possible  aspiration (right base infiltrate).  Type of Study: Modified Barium Swallowing Study Reason for Referral: Objectively evaluate swallowing function Previous Swallow Assessment: none Diet Prior to this Study: Other (Comment) (SOFT) Temperature Spikes Noted: No Respiratory Status: Room air History of Recent Intubation: No Behavior/Cognition: Alert;Cooperative;Pleasant mood;Doesn't  follow directions Oral Cavity - Dentition: Missing dentition Oral Motor / Sensory Function: Within functional limits Self-Feeding Abilities: Able to feed self;Needs assist Patient Positioning: Upright in chair Baseline Vocal Quality: Clear Volitional Cough: Cognitively unable to elicit Volitional Swallow: Unable to elicit Anatomy: Within functional limits Pharyngeal Secretions: Not observed secondary MBS    Reason for Referral Objectively evaluate swallowing function   Oral Phase Oral Preparation/Oral  Phase Oral Phase: Impaired Oral - Thin Oral - Thin Cup: Lingual pumping Oral - Thin Straw: Lingual pumping Oral - Solids Oral - Puree: Lingual pumping Oral - Mechanical Soft: Impaired mastication (only anterior  dentition)   Pharyngeal Phase Pharyngeal Phase Pharyngeal Phase: Impaired Pharyngeal - Thin Pharyngeal - Thin Cup: Delayed swallow initiation;Pharyngeal  residue -  pyriform sinuses Pharyngeal - Thin Straw: Delayed swallow initiation;Pharyngeal  residue - pyriform sinuses;Penetration/Aspiration after swallow Penetration/Aspiration details (thin straw): Material enters  airway, remains ABOVE vocal cords then ejected out Pharyngeal - Solids Pharyngeal - Puree: Delayed swallow initiation Pharyngeal - Mechanical Soft: Delayed swallow initiation  Cervical Esophageal Phase    GO    Cervical Esophageal Phase Cervical Esophageal Phase: WFL (appearance of possible hiatal  hernia, no radiologist present)        Herbie Baltimore, MA CCC-SLP [phone]  DeBlois, Katherene Ponto 08/12/2013, 2:57 PM          Subjective: Patient is awake and alert. Does not follow commands. No reports of vomiting, diarrhea, respiratory distress, uncontrolled pain  Objective: Filed Vitals:   09/03/13 2228 09/03/13 2300 09/04/13 0500 09/04/13 0615  BP: 132/74   111/72  Pulse: 96   91  Temp: 99.1 F (37.3 C)   99.1 F (37.3 C)  TempSrc: Axillary   Axillary  Resp: $Remo'28 15  24  'SIdsK$ Height:      Weight:   46.49 kg (102 lb 7.9 oz)   SpO2: 95%   94%    Intake/Output Summary (Last 24 hours) at 09/04/13 1339 Last data filed at 09/04/13 9323  Gross per 24 hour  Intake      0 ml  Output   1075 ml  Net  -1075 ml   Weight change: -0.004 kg (-0.1 oz) Exam:   General:  Pt is alert, does not follow commands appropriately, not in acute distress  HEENT: No icterus, No thrush, [place]/AT  Cardiovascular: RRR, S1/S2, no rubs, no gallops  Respiratory: Poor inspiratory effort but clear to auscultation.  Abdomen: Soft/+BS, non tender,  non distended, no guarding  Extremities: trace LE edema, No lymphangitis, No petechiae, No rashes, no synovitis  Data Reviewed: Basic Metabolic Panel:  Recent Labs Lab 09/02/13 2143 09/03/13 0605 09/04/13 0756  NA 148* 149* 145  K 3.6* 3.3* 3.8  CL 108 110 110  CO2 $Re'22 24 22  'KRY$ GLUCOSE 85 83 131*  BUN 13 11 5*  CREATININE 0.44* 0.43* 0.38*  CALCIUM 9.9 9.4 9.0   Liver Function Tests:  Recent Labs Lab 09/02/13 2143 09/03/13 0605  AST 14 11  ALT 10 11  ALKPHOS 82 82  BILITOT 0.4 0.4  PROT 6.3 6.0  ALBUMIN 2.6* 2.6*   No results found for this basename: LIPASE, AMYLASE,  in the last 168 hours No results found for this basename: AMMONIA,  in the last 168 hours CBC:  Recent Labs Lab 09/02/13 2143 09/03/13 0605 09/04/13 0756  WBC 6.7 6.1 9.2  HGB 12.7* 13.0 12.9*  HCT 41.8 42.3 40.8  MCV 89.9 91.4 90.7  PLT 383 436* 335   Cardiac Enzymes:  Recent Labs Lab 09/04/13 0756  CKTOTAL 49   BNP: No components found with this basename: POCBNP,  CBG: No results found for this basename: GLUCAP,  in the last 168 hours  Recent Results (from the past 240 hour(s))  URINE CULTURE     Status: None   Collection Time    09/03/13  6:16 AM      Result Value Ref Range Status   Specimen Description URINE, CATHETERIZED   Final   Special Requests NONE   Final   Culture  Setup Time     Final   Value: 09/03/2013 14:28     Performed at [facility]     Final   Value: >=100,000 COLONIES/ML  Performed at [facility]     Final   Value: Multiple bacterial morphotypes present, none predominant. Suggest appropriate recollection if clinically indicated.     Performed at [facility]   Report Status 09/04/2013 FINAL   Final     Scheduled Meds: . antiseptic oral rinse  7 mL Mouth Rinse BID  . atorvastatin  10 mg Oral Daily  . chlorhexidine  10 mL Mouth/Throat TID  . cholecalciferol  2,000 Units Oral Daily  . enoxaparin  (LOVENOX) injection  30 mg Subcutaneous Q24H  . LORazepam  1 mg Intravenous Once  . magic mouthwash w/lidocaine  10 mL Oral QID  . pantoprazole (PROTONIX) IV  40 mg Intravenous Q12H  . polyethylene glycol  17 g Oral BID   Continuous Infusions: . dextrose 5 % and 0.45% NaCl 1,000 mL with potassium chloride 20 mEq infusion 100 mL/hr at 09/04/13 1224     Hettie Roselli, DO  [facility] Pager [phone]  If 7PM-7AM, please contact night-coverage [email or web address] Password [email or web address] 09/04/2013, 1:39 PM   LOS: 2 days

## 2013-09-04 NOTE — Progress Notes (Addendum)
Progress Note   Subjective  Sisters in room. Patient still vomiting or not swallowing PO. No BM in two days   Objective   Vital signs in last 24 hours: Temp:  [97.6 F (36.4 C)-99.1 F (37.3 C)] 99.1 F (37.3 C) (08/22 0615) Pulse Rate:  [79-96] 91 (08/22 0615) Resp:  [15-28] 24 (08/22 0615) BP: (111-132)/(66-74) 111/72 mmHg (08/22 0615) SpO2:  [94 %-96 %] 94 % (08/22 0615) Weight:  [102 lb 7.9 oz (46.49 kg)] 102 lb 7.9 oz (46.49 kg) (08/22 0500) Last BM Date: 09/02/13  General:    White male, chronically ill appearing, nonverbal, in NAD Abdomen:  Soft, nontender and nondistended. Normal bowel sounds. Extremities:  Without edema. Neurologic:  Alert     Lab Results:  Recent Labs  09/02/13 2143 09/03/13 0605 09/04/13 0756  WBC 6.7 6.1 9.2  HGB 12.7* 13.0 12.9*  HCT 41.8 42.3 40.8  PLT 383 436* 335   BMET  Recent Labs  09/02/13 2143 09/03/13 0605 09/04/13 0756  NA 148* 149* 145  K 3.6* 3.3* 3.8  CL 108 110 110  CO2 22 24 22   GLUCOSE 85 83 131*  BUN 13 11 5*  CREATININE 0.44* 0.43* 0.38*  CALCIUM 9.9 9.4 9.0   LFT  Recent Labs  09/03/13 0605  PROT 6.0  ALBUMIN 2.6*  AST 11  ALT 11  ALKPHOS 82  BILITOT 0.4   Studies/Results: Portable Chest 1 View  09/03/2013   CLINICAL DATA:  Evaluate for infiltrates  EXAM: PORTABLE CHEST - 1 VIEW  COMPARISON:  09/13/2013  FINDINGS: Suboptimal study due to patient rotation. There is no gross change in the normal heart size and mild aortic tortuosity. There is no edema, consolidation, effusion, or pneumothorax.  IMPRESSION: No evidence of active cardiopulmonary disease.   Electronically Signed   By: Jorje Guild M.D.   On: 09/03/2013 01:05   Dg Ugi W/o Kub  09/03/2013   CLINICAL DATA:  Nausea, vomiting. History of gastric and duodenal ulcers.  EXAM: UPPER GI SERIES WITHOUT KUB  TECHNIQUE: Routine upper GI series was performed with high-density barium.  FLUOROSCOPY TIME:  1 min 47 seconds  COMPARISON:  08/26/2013  radiographs.  FINDINGS: The patient was either unable or would not drink contrast. The patient could not be positioned in the standard supine position. Despite the lateral positioning in the fetal position, I attempted to proceed with this study however the patient would not ingest contrast. In an attempt to stimulate swallowing, 2 mL of thick barium contrast was placed in the patient's mouth. Over the course of 1 min and 30 seconds, the patient did eventually swallow and this tiny bolus was observed to enter the esophagus. At this point, this study was deemed not feasible and terminated.  IMPRESSION: Aborted upper GI study.  The patient would not drink contrast.  I discussed the case with Dr. Fuller Plan. The patient had a recent endoscopy and the upper GI study is to ensure continued patency. The patient will require an enteric tube and then an upper GI and study could be performed through the enteric tube. Given the patient's positioning, the upper GI study even with a tube will be limited but should be adequate to assess for gross patency of the upper GI tract.   Electronically Signed   By: Dereck Ligas M.D.   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  Large prepyloric ulcer and small duodenal ulcers on EGD late July. Unable/unwilling to take meds at home due to refusal to eat/vomiting. Attempted UGI series to evaluate for GOO yesterday but patient wouldn't drink contrast. NGT placement unsuccessful after 3 attempts. Per sisters who are aggravated at lack of progress, it usually takes about 10 minutes for patient to vomit after PO intake. He may have outlet obstruction. ENT evaluated yesterday and couldn't find any reason from ENT standpoint that patient wasn't taking PO. SLP did not find oropharnygeal problems. Continue IV PPI. UGI series via fluoroscopy placed NGT  today. If that is not successful will need repeat EGD. Further neurologic work up to evaluate for other reasons for dysphagia/refusal to eat.     LOS: 2 days   Tye Savoy  09/04/2013, 10:17 AM     Attending physician's note   I have taken an interval history, reviewed the chart and examined the patient. I agree with the Advanced Practitioner's note, impression and recommendations. Mgmt plans discussed with Dr. Carles Collet and patients 2 sisters. If no reversible causes of his feeding difficulties are uncovered he may need a gastrostomy tube for nutritional support. Pending findings on UGI series today he may need EGD tomorrow. Patients sisters consent to proceed with EGD if needed. The risks, benefits, and alternatives to endoscopy with possible biopsy and possible dilation were discussed with the patients sisters and they consent to proceed.    Pricilla Riffle. Fuller Plan, MD [facility]

## 2013-09-04 NOTE — Evaluation (Signed)
Clinical/Bedside Swallow Evaluation Patient Details  Name: Steven [name] MRN: [id number] Date of Birth: [birth year]  Today's Date: 09/04/2013 Time: 0803-0820 SLP Time Calculation (min): 17 min  Past Medical History:  Past Medical History  Diagnosis Date  . Hyperlipidemia   . Anxiety   . Mental retardation     ue to temp of 108 aftrer having measles age 58.  . Hx of diaper rash   . Edema of foot 02/06/2012    BILATERAL   Past Surgical History:  Past Surgical History  Procedure Laterality Date  . Orif ankle fracture  02/06/2012    RIGHT ANKLE  . Orif ankle fracture  02/06/2012    Procedure: OPEN REDUCTION INTERNAL FIXATION (ORIF) ANKLE FRACTURE;  Surgeon: Wylene Simmer, MD;  Location: [place];  Service: Orthopedics;  Laterality: Right;  . Syndesmosis repair  02/06/2012    Procedure: SYNDESMOSIS REPAIR;  Surgeon: Wylene Simmer, MD;  Location: [place];  Service: Orthopedics;  Laterality: Right;  tri maleolar  . Colonoscopy N/A 08/11/2013    Procedure: COLONOSCOPY;  Surgeon: Gatha Mayer, MD;  Location: [place];  Service: Endoscopy;  Laterality: N/A;  . Esophagogastroduodenoscopy N/A 08/11/2013    Procedure: ESOPHAGOGASTRODUODENOSCOPY (EGD);  Surgeon: Gatha Mayer, MD;  Location: [facility] ENDOSCOPY;  Service: Endoscopy;  Laterality: N/A;   HPI:  58 year old male with PMH of anxiety, MR, recently discharged fro hospital 8/1 following similar presentation with poor by mouth intake, weight loss, found to have large stomach ulcer as well as PNA. Re-admitted with very poor by moutnintake. Diagnosed with significant dehydration with hypernatremia.  ENT consulted and noted clear upper airway exam via laryngoscopy. Patient refused to swallow contrast for UGI study. MBS complete last admission 7/30 and recommended a dysphagia 3 diet with thin liquids with low risk of aspiration.    Assessment / Plan / Recommendation Clinical Impression  Patient presents with what appears to be a functional  oropharyngeal swallow, consistent with most recent MBS results. No overt s/s of aspiration observed however po trials limited. Patient was agreeable, via head nodding, to taste every liquid consistency on am meal tray however refused following 1-2 sips of each. Reporting poor taste without oral or pharyngeal pain during swallow. Question thrush although difficult to evaluate oral cavity. At this time, do not see need for f/u SLP services as oropharyngeal function does not appear to be cause of decreased po intake. Noted ENT notes. Question also if GI consult would be beneficial to r/o GI component. Please reconsult if needed.     Aspiration Risk  Mild    Diet Recommendation Dysphagia 3 (Mechanical Soft);Thin liquid   Liquid Administration via: Cup;Straw Medication Administration: Whole meds with puree Supervision: Staff to assist with self feeding Compensations: Slow rate;Small sips/bites Postural Changes and/or Swallow Maneuvers: Seated upright 90 degrees    Other  Recommendations Recommended Consults: Consider GI evaluation Oral Care Recommendations: Oral care BID   Follow Up Recommendations  None       Pertinent Vitals/Pain n/a     Swallow Study    General HPI: 58 year old male with PMH of anxiety, MR, recently discharged fro hospital 8/1 following similar presentation with poor by mouth intake, weight loss, found to have large stomach ulcer as well as PNA. Re-admitted with very poor by moutnintake. Diagnosed with significant dehydration with hypernatremia.  ENT consulted and noted clear upper airway exam via laryngoscopy. Patient refused to swallow contrast for UGI study. MBS complete last admission 7/30 and recommended a  dysphagia 3 diet with thin liquids with low risk of aspiration.  Type of Study: Bedside swallow evaluation Previous Swallow Assessment: see HPI Diet Prior to this Study: Thin liquids (clear liquids) Temperature Spikes Noted: No Respiratory Status: Room air History  of Recent Intubation: No Behavior/Cognition: Alert;Cooperative;Pleasant mood (non-verbal) Oral Cavity - Dentition: Missing dentition Self-Feeding Abilities: Total assist Patient Positioning: Upright in bed Baseline Vocal Quality:  (non-verbal) Volitional Cough: Cognitively unable to elicit Volitional Swallow: Unable to elicit    Oral/Motor/Sensory Function Overall Oral Motor/Sensory Function: Other (comment) (limited due to inability to follow commands vs participation)   [employer] chips: Not tested   Thin Liquid Thin Liquid: Within functional limits Presentation: Straw;Spoon    Nectar Thick Nectar Thick Liquid: Not tested   Honey Thick Honey Thick Liquid: Not tested   Puree Puree: Not tested   Solid   GO   Tymara Saur MA, CCC-SLP [phone]  Solid: Not tested       Charne Mcbrien Meryl 09/04/2013,10:46 AM

## 2013-09-05 ENCOUNTER — Encounter (HOSPITAL_COMMUNITY)
Admission: AD | Disposition: A | Discharge status: Skilled Nursing Facility | Payer: Self-pay | Source: Ambulatory Visit | Attending: Internal Medicine

## 2013-09-05 ENCOUNTER — Inpatient Hospital Stay (HOSPITAL_COMMUNITY): Payer: Medicare Other

## 2013-09-05 ENCOUNTER — Encounter (HOSPITAL_COMMUNITY): Payer: Self-pay

## 2013-09-05 DIAGNOSIS — K222 Esophageal obstruction: Principal | ICD-10-CM

## 2013-09-05 DIAGNOSIS — R933 Abnormal findings on diagnostic imaging of other parts of digestive tract: Secondary | ICD-10-CM

## 2013-09-05 DIAGNOSIS — K279 Peptic ulcer, site unspecified, unspecified as acute or chronic, without hemorrhage or perforation: Secondary | ICD-10-CM

## 2013-09-05 HISTORY — PX: SAVORY DILATION: SHX5439

## 2013-09-05 HISTORY — PX: ESOPHAGOGASTRODUODENOSCOPY: SHX5428

## 2013-09-05 LAB — BASIC METABOLIC PANEL
ANION GAP: 10 (ref 5–15)
BUN: 3 mg/dL — ABNORMAL LOW (ref 6–23)
CALCIUM: 9 mg/dL (ref 8.4–10.5)
CO2: 25 mEq/L (ref 19–32)
CREATININE: 0.34 mg/dL — AB (ref 0.50–1.35)
Chloride: 108 mEq/L (ref 96–112)
GFR calc Af Amer: >90 mL/min (ref >90)
Glucose, Bld: 148 mg/dL — ABNORMAL HIGH (ref 70–99)
Potassium: 3.8 mEq/L (ref 3.7–5.3)
Sodium: 143 mEq/L (ref 137–147)

## 2013-09-05 LAB — CLOSTRIDIUM DIFFICILE BY PCR: CDIFFPCR: NEGATIVE

## 2013-09-05 SURGERY — EGD (ESOPHAGOGASTRODUODENOSCOPY)
Anesthesia: Moderate Sedation

## 2013-09-05 MED ORDER — FENTANYL CITRATE 0.05 MG/ML IJ SOLN
INTRAMUSCULAR | Status: DC | PRN
  Administered 2013-09-05 (×2): 25 ug via INTRAVENOUS

## 2013-09-05 MED ORDER — FENTANYL CITRATE 0.05 MG/ML IJ SOLN
INTRAMUSCULAR | Status: AC
  Filled 2013-09-05: qty 2

## 2013-09-05 MED ORDER — MIDAZOLAM HCL 5 MG/ML IJ SOLN
INTRAMUSCULAR | Status: AC
  Filled 2013-09-05: qty 2

## 2013-09-05 MED ORDER — SODIUM CHLORIDE 0.9 % IV SOLN
INTRAVENOUS | Status: DC
  Administered 2013-09-05: 500 mL via INTRAVENOUS

## 2013-09-05 MED ORDER — BOOST / RESOURCE BREEZE PO LIQD
1.0000 | Freq: Three times a day (TID) | ORAL | Status: DC
  Administered 2013-09-05 – 2013-09-08 (×6): 1 via ORAL
  Filled 2013-09-05 (×4): qty 1

## 2013-09-05 MED ORDER — DIPHENHYDRAMINE HCL 50 MG/ML IJ SOLN
INTRAMUSCULAR | Status: AC
  Filled 2013-09-05: qty 1

## 2013-09-05 MED ORDER — LORAZEPAM 2 MG/ML IJ SOLN
1.0000 mg | Freq: Once | INTRAMUSCULAR | Status: AC
  Administered 2013-09-05: 1 mg via INTRAVENOUS
  Filled 2013-09-05: qty 1

## 2013-09-05 MED ORDER — MIDAZOLAM HCL 10 MG/2ML IJ SOLN
INTRAMUSCULAR | Status: DC | PRN
Start: 2013-09-05 — End: 2013-09-05
  Administered 2013-09-05: 2 mg via INTRAVENOUS
  Administered 2013-09-05: 3 mg via INTRAVENOUS

## 2013-09-05 NOTE — Progress Notes (Signed)
Patient ate 90% of his lunch following his EGD. When asking patient if he feels better he nods. Is able to swallow and keep food down without any emesis. Family is in much better spirits now and they are very happy he is eating again.

## 2013-09-05 NOTE — Progress Notes (Signed)
PROGRESS NOTE  Steven [name] UYQ:[id number] DOB: 1955-12-11 DOA: 09/02/2013 PCP: [name], MD  Assessment/Plan: dehydration with hypernatremia  Due to little or no PO intake.  -continue D5 1/2NS.  09/03/13--NG tube placement attempted by RN--pt pulled out once and met resistance second try  -Asked IR to try to place NG under fluoro and inject dye to r/o GOO--unable due to severe distal esophageal stricture see on xray  Esphagitis/Esophageal Stricture -contributing to pt's dysphagia -09/05/2013 EGD--severe esophageal stricture or lower third of the esophagus, grade D esophagitis, nonbleeding ulcers in the prepyloric stand duodenal bulb -suspect pt has pain and does not WANT to swallow  Repeat clinical swallow eval.-->dysphagia 3 Family very angry (feel not enough is being done).  -ENT--Dr. [name] performed laryngoscopy which was negative for any abscess or obstruction  -Dr. [name] also attempted to place NG tube laryngoscope, but met resistance  -Family wants a PEG if we are unable to restore his ability to swallow.  -On BID PPI IV. Bx negative for H pylori.  -NSAIDS stopped.  -MRI of brain without contrast  Severe Malnutrition/failure to thrive.  -Patient has lost 32% of his body weight in 5 weeks!  -Appreciate nutrition consultation  -family still desires gastrostomy tube if pt still unable or not wanting to swallow -TSH 2.380  -Serum B12--1109  -A.m. cortisol 23.5  Contracture -PT for ROM -OOB bid Diarrhea?  Non recently per family.  Stool studies are pending but have yet to be collected.  Doubt c-diff given normal WBC and no stools.  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  Patient currently dehydrated.  Iron deficiency  -Given a dose of IV Nulecit  -difficult situation due to pt's chronic constipation to give po iron  Family Communication: Sisters updated at bedside--time spent >82min  Disposition Plan: Group home when medically  stable         Procedures/Studies: Dg Chest 2 View  08/07/2013   CLINICAL DATA:  Rule out infection  EXAM: CHEST  2 VIEW  COMPARISON:  01/27/2012.  FINDINGS: Low volume film with airspace disease in the right mid lung compatible with pneumonia. Left lung is clear. Left upper lobe nodular density seen on the previous study is not evident on today's exam. The cardio pericardial silhouette is enlarged. No edema or pleural effusion. Bones are diffusely demineralized.  IMPRESSION: Right midlung airspace disease, suspicious for pneumonia.   Electronically Signed   By: Misty Stanley M.D.   On: 08/07/2013 21:04   Dg Ankle 2 Views Left  08/13/2013   CLINICAL DATA:  Left ankle pain  EXAM: LEFT ANKLE - 2 VIEW  COMPARISON:  None  FINDINGS: Three views of the left ankle submitted. No acute fracture or subluxation. No radiopaque foreign body.  IMPRESSION: Negative.   Electronically Signed   By: Lahoma Crocker M.D.   On: 08/13/2013 16:09   Portable Chest 1 View  09/03/2013   CLINICAL DATA:  Evaluate for infiltrates  EXAM: PORTABLE CHEST - 1 VIEW  COMPARISON:  09/13/2013  FINDINGS: Suboptimal study due to patient rotation. There is no gross change in the normal heart size and mild aortic tortuosity. There is no edema, consolidation, effusion, or pneumothorax.  IMPRESSION: No evidence of active cardiopulmonary disease.   Electronically Signed   By: Jorje Guild M.D.   On: 09/03/2013 01:05   Dg Chest Port 1 View  08/10/2013   CLINICAL DATA:  Dyspnea  EXAM: PORTABLE CHEST -  1 VIEW  COMPARISON:  08/09/2013  FINDINGS: A left-sided central venous line and nasogastric catheter are again noted in satisfactory position. The cardiac shadow remains enlarged. Patient is significantly rotated to the right accentuating the mediastinal markings. Bibasilar infiltrative changes are again seen slightly worse on the right than the left. No acute bony abnormality is noted.  IMPRESSION: Persistent bibasilar changes.   Electronically  Signed   By: Inez Catalina M.D.   On: 08/10/2013 12:59   Dg Chest Port 1 View  08/09/2013   CLINICAL DATA:  Decreased O2 saturation.  EXAM: PORTABLE CHEST - 1 VIEW 01:03 a.m.  COMPARISON:  08/08/2013 and 08/07/2013  FINDINGS: The patient has developed and increased infiltrate at the right lung base with new atelectasis and an infiltrate in the left perihilar region as well as new small right pleural effusion. I suspect the findings represent pulmonary edema.  NG tube tip is below the diaphragm. Central venous catheter tip is at the cavoatrial junction.  IMPRESSION: Progressive infiltrate at the right base with new small right effusion.  New atelectasis in faint infiltrate in the left. I suspect the findings represent pulmonary edema.   Electronically Signed   By: [name] M.D.   On: 08/09/2013 01:26   Dg Chest Port 1 View  08/08/2013   CLINICAL DATA:  Central line placement.  EXAM: PORTABLE CHEST - 1 VIEW  COMPARISON:  Chest radiograph performed 08/07/2013  FINDINGS: The patient's left subclavian line is seen ending about the cavoatrial junction.  Lung expansion is improved, though evaluation is suboptimal due to patient rotation. Patchy right-sided airspace opacities raise concern for pneumonia, perhaps slightly more confluent than on the prior study. The left lung appears clear. No pleural effusion or pneumothorax is seen.  The cardiomediastinal silhouette is normal in size. No acute osseous abnormalities are identified.  IMPRESSION: 1. Left subclavian line seen ending about the cavoatrial junction. 2. Patchy right-sided airspace opacities raise concern for pneumonia, perhaps slightly more confluent than on the prior study.   Electronically Signed   By: Garald Balding M.D.   On: 08/08/2013 01:49   Dg Abd Acute W/chest  08/26/2013   CLINICAL DATA:  Emesis.  Poorly eating.  EXAM: ACUTE ABDOMEN SERIES (ABDOMEN 2 VIEW & CHEST 1 VIEW)  COMPARISON:  Chest x-ray a 08/10/2013. Abdominal radiograph 08/08/2013.   FINDINGS: Lung volumes are low. Slight coarsening of interstitial markings, similar to prior examinations. No consolidative airspace disease. No pleural effusions. No pneumothorax. No pulmonary nodule or mass noted. Pulmonary vasculature and the cardiomediastinal silhouette are within normal limits.  Gas and stool are seen scattered throughout the colon extending to the level of the distal rectum. No pathologic distension of small bowel is noted. No gross evidence of pneumoperitoneum. Multiple nondilated gas-filled loops of small bowel throughout the central abdomen.  IMPRESSION: 1. Nonspecific, nonobstructive bowel gas pattern. 2. No pneumoperitoneum. 3. Low lung volumes without radiographic evidence of acute cardiopulmonary disease.   Electronically Signed   By: Vinnie Langton M.D.   On: 08/26/2013 18:58   Dg Abd Portable 1v  08/08/2013   CLINICAL DATA:  NG tube placement.  EXAM: PORTABLE ABDOMEN - 1 VIEW  COMPARISON:  None.  FINDINGS: An NG tube is identified with tip overlying the proximal-mid stomach.  The bowel gas pattern is unremarkable.  IMPRESSION: NG tube with tip overlying the proximal -mid stomach.   Electronically Signed   By: [name] M.D.   On: 08/08/2013 15:55   Dg Loyce Dys  Tube Plc W/fl W/rad  09/04/2013   CLINICAL DATA:  Unable to pass nasogastric tube.  EXAM: NASO G TUBE PLACEMENT WITH FL AND WITH RAD  TECHNIQUE: Under direct fluoroscopic guidance the nasogastric tube was attempted to be passed into the stomach.  CONTRAST:  10 cc of thin barium  FLUOROSCOPY TIME:  5 min  COMPARISON:  None  FINDINGS: Initially the nasogastric tube was introduced through the left nostril and into the mid esophagus. The nasogastric tube was unable to be passed into the distal esophagus. A few cc of barium or injected into the nasogastric tube opacifying the mid and distal esophagus. Within the distal half of the esophagus there is a long segment of marked luminal narrowing. This appears to account for the  inability to pass nasogastric tube into the stomach.  IMPRESSION: 1. Apparent long segment of marked luminal narrowing involving the distal esophagus through which the nasogastric tube was unable to be passed. This may represent a benign or malignant stricture. Endoscopic evaluation is recommended.   Electronically Signed   By: Kerby Moors M.D.   On: 09/04/2013 16:21   Dg Ugi W/o Kub  09/03/2013   CLINICAL DATA:  Nausea, vomiting. History of gastric and duodenal ulcers.  EXAM: UPPER GI SERIES WITHOUT KUB  TECHNIQUE: Routine upper GI series was performed with high-density barium.  FLUOROSCOPY TIME:  1 min 47 seconds  COMPARISON:  08/26/2013 radiographs.  FINDINGS: The patient was either unable or would not drink contrast. The patient could not be positioned in the standard supine position. Despite the lateral positioning in the fetal position, I attempted to proceed with this study however the patient would not ingest contrast. In an attempt to stimulate swallowing, 2 mL of thick barium contrast was placed in the patient's mouth. Over the course of 1 min and 30 seconds, the patient did eventually swallow and this tiny bolus was observed to enter the esophagus. At this point, this study was deemed not feasible and terminated.  IMPRESSION: Aborted upper GI study.  The patient would not drink contrast.  I discussed the case with Dr. Fuller Plan. The patient had a recent endoscopy and the upper GI study is to ensure continued patency. The patient will require an enteric tube and then an upper GI and study could be performed through the enteric tube. Given the patient's positioning, the upper GI study even with a tube will be limited but should be adequate to assess for gross patency of the upper GI tract.   Electronically Signed   By: Dereck Ligas M.D.   On: 09/03/2013 15:04   Dg Swallowing Func-speech Pathology  08/12/2013   Katherene Ponto Deblois, CCC-SLP     08/12/2013  2:59 PM Objective Swallowing Evaluation:  Modified Barium Swallowing Study   Patient Details  Name: TIJUAN DANTES MRN: [id number] Date of Birth: December 31, 1955  Today's Date: 08/12/2013 Time: 1305-1340 SLP Time Calculation (min): 35 min  Past Medical History:  Past Medical History  Diagnosis Date  . Hyperlipidemia   . Anxiety   . Mental retardation     ue to temp of 108 aftrer having measles age 58.  . Hx of diaper rash   . Edema of foot 02/06/2012    BILATERAL   Past Surgical History:  Past Surgical History  Procedure Laterality Date  . Orif ankle fracture  02/06/2012    RIGHT ANKLE  . Orif ankle fracture  02/06/2012    Procedure: OPEN REDUCTION INTERNAL FIXATION (ORIF) ANKLE  FRACTURE;  Surgeon: Wylene Simmer, MD;  Location: [place];  Service:  Orthopedics;  Laterality: Right;  . Syndesmosis repair  02/06/2012    Procedure: SYNDESMOSIS REPAIR;  Surgeon: Wylene Simmer, MD;   Location: [place];  Service: Orthopedics;  Laterality: Right;  tri  maleolar  . Colonoscopy N/A 08/11/2013    Procedure: COLONOSCOPY;  Surgeon: Gatha Mayer, MD;   Location: [place];  Service: Endoscopy;  Laterality: N/A;  . Esophagogastroduodenoscopy N/A 08/11/2013    Procedure: ESOPHAGOGASTRODUODENOSCOPY (EGD);  Surgeon: Gatha Mayer, MD;  Location: [facility] ENDOSCOPY;  Service: Endoscopy;   Laterality: N/A;   HPI:  58 year old male with a history of mental retardation,  hyperlipidemia was taken to urgent care because of poor by mouth  intake. Apparently the patient has lost a significant amount of  weight over the last few weeks. The patient was transferred to  the ED and noted to be hypotensive and have leukocytosis with WBC  18.1.  He was seen by GI, determined to have a bleed and EGD  showed large ulcers, suspected to be caused by NSAID.  The  patient was started on intravenous antibiotics for sepsis  secondary to healthcare associated pneumonia and possible  aspiration (right base infiltrate).      Assessment / Plan / Recommendation Clinical Impression  Dysphagia Diagnosis: Mild oral phase  dysphagia;Mild pharyngeal  phase dysphagia Clinical impression: Pt presents with a mild oral dysphagia with  tongue pumping, anterior mastication due to missing posterior  dentition. Is able to manage soft solids well. Pt does have a  delayed swallow response, though with consecutive boluses pt is  able to maintain hyolaryngeal elevation and airway protection.  There was one instance of trace penetration above the cords with  mild pyriform sinuses residuals present post swallow with large  sips. At this time, aspiration risk appears quite low. Pt is  recommended to consume mechanical soft diet with thin liquids  with normal PO intake habits (self feeding though somewhat  impulsive). If family notices possible signs of aspiration,  recommend full supervision with POs for small single sips,  restricting straws. No SLP f/u needed at this time.     Treatment Recommendation  No treatment recommended at this time    Diet Recommendation Dysphagia 3 (Mechanical Soft);Thin liquid   Liquid Administration via: Cup;Straw Medication Administration: Whole meds with puree Supervision: Staff to assist with self feeding Compensations: Slow rate;Small sips/bites Postural Changes and/or Swallow Maneuvers: Seated upright 90  degrees    Other  Recommendations Oral Care Recommendations: Oral care BID   Follow Up Recommendations  Other (comment) (group home)    Frequency and Duration        Pertinent Vitals/Pain NA    SLP Swallow Goals     General HPI: 58 year old male with a history of mental  retardation, hyperlipidemia was taken to urgent care because of  poor by mouth intake. Apparently the patient has lost a  significant amount of weight over the last few weeks. The patient  was transferred to the ED and noted to be hypotensive and have  leukocytosis with WBC 18.1.  He was seen by GI, determined to  have a bleed and EGD showed large ulcers, suspected to be caused  by NSAID.  The patient was started on intravenous antibiotics for   sepsis secondary to healthcare associated pneumonia and possible  aspiration (right base infiltrate).  Type of Study: Modified Barium Swallowing Study Reason for Referral: Objectively evaluate swallowing function Previous Swallow Assessment:  none Diet Prior to this Study: Other (Comment) (SOFT) Temperature Spikes Noted: No Respiratory Status: Room air History of Recent Intubation: No Behavior/Cognition: Alert;Cooperative;Pleasant mood;Doesn't  follow directions Oral Cavity - Dentition: Missing dentition Oral Motor / Sensory Function: Within functional limits Self-Feeding Abilities: Able to feed self;Needs assist Patient Positioning: Upright in chair Baseline Vocal Quality: Clear Volitional Cough: Cognitively unable to elicit Volitional Swallow: Unable to elicit Anatomy: Within functional limits Pharyngeal Secretions: Not observed secondary MBS    Reason for Referral Objectively evaluate swallowing function   Oral Phase Oral Preparation/Oral Phase Oral Phase: Impaired Oral - Thin Oral - Thin Cup: Lingual pumping Oral - Thin Straw: Lingual pumping Oral - Solids Oral - Puree: Lingual pumping Oral - Mechanical Soft: Impaired mastication (only anterior  dentition)   Pharyngeal Phase Pharyngeal Phase Pharyngeal Phase: Impaired Pharyngeal - Thin Pharyngeal - Thin Cup: Delayed swallow initiation;Pharyngeal  residue - pyriform sinuses Pharyngeal - Thin Straw: Delayed swallow initiation;Pharyngeal  residue - pyriform sinuses;Penetration/Aspiration after swallow Penetration/Aspiration details (thin straw): Material enters  airway, remains ABOVE vocal cords then ejected out Pharyngeal - Solids Pharyngeal - Puree: Delayed swallow initiation Pharyngeal - Mechanical Soft: Delayed swallow initiation  Cervical Esophageal Phase    GO    Cervical Esophageal Phase Cervical Esophageal Phase: WFL (appearance of possible hiatal  hernia, no radiologist present)        Herbie Baltimore, MA CCC-SLP [phone]  DeBlois, Katherene Ponto 08/12/2013,  2:57 PM          Subjective: Patient is more alert today. Denies any pain. No respiratory distress, vomiting, diarrhea, uncontrolled pain  Objective: Filed Vitals:   09/05/13 1000 09/05/13 1010 09/05/13 1020 09/05/13 1043  BP: 116/71 111/73 107/73 122/73  Pulse: 86 77 77 77  Temp:    97.7 F (36.5 C)  TempSrc:    Axillary  Resp: $Remo'18 22 23 18  'tCXHN$ Height:      Weight:      SpO2: 100% 97% 97% 98%    Intake/Output Summary (Last 24 hours) at 09/05/13 1344 Last data filed at 09/05/13 1002  Gross per 24 hour  Intake 3916.67 ml  Output    350 ml  Net 3566.67 ml   Weight change: -0.359 kg (-12.7 oz) Exam:   General:  Pt is alert, follows commands appropriately, not in acute distress  HEENT: No icterus, No thrush,/AT  Cardiovascular: RRR, S1/S2, no rubs, no gallops  Respiratory: Poor inspiratory effort but clear to auscultation. No wheezing.  Abdomen: Soft/+BS, non tender, non distended, no guarding  Extremities: No edema, No lymphangitis, No petechiae, No rashes, no synovitis  Data Reviewed: Basic Metabolic Panel:  Recent Labs Lab 09/02/13 2143 09/03/13 0605 09/04/13 0756 09/05/13 0540  NA 148* 149* 145 143  K 3.6* 3.3* 3.8 3.8  CL 108 110 110 108  CO2 $Re'22 24 22 25  'YGB$ GLUCOSE 85 83 131* 148*  BUN 13 11 5* 3*  CREATININE 0.44* 0.43* 0.38* 0.34*  CALCIUM 9.9 9.4 9.0 9.0   Liver Function Tests:  Recent Labs Lab 09/02/13 2143 09/03/13 0605  AST 14 11  ALT 10 11  ALKPHOS 82 82  BILITOT 0.4 0.4  PROT 6.3 6.0  ALBUMIN 2.6* 2.6*   No results found for this basename: LIPASE, AMYLASE,  in the last 168 hours No results found for this basename: AMMONIA,  in the last 168 hours CBC:  Recent Labs Lab 09/02/13 2143 09/03/13 0605 09/04/13 0756  WBC 6.7 6.1 9.2  HGB 12.7* 13.0 12.9*  HCT 41.8  42.3 40.8  MCV 89.9 91.4 90.7  PLT 383 436* 335   Cardiac Enzymes:  Recent Labs Lab 09/04/13 0756  CKTOTAL 49   BNP: No components found with this basename:  POCBNP,  CBG: No results found for this basename: GLUCAP,  in the last 168 hours  Recent Results (from the past 240 hour(s))  URINE CULTURE     Status: None   Collection Time    09/03/13  6:16 AM      Result Value Ref Range Status   Specimen Description URINE, CATHETERIZED   Final   Special Requests NONE   Final   Culture  Setup Time     Final   Value: 09/03/2013 14:28     Performed at [facility]     Final   Value: >=100,000 COLONIES/ML     Performed at [facility]   Culture     Final   Value: Multiple bacterial morphotypes present, none predominant. Suggest appropriate recollection if clinically indicated.     Performed at [facility]   Report Status 09/04/2013 FINAL   Final     Scheduled Meds: . antiseptic oral rinse  7 mL Mouth Rinse BID  . atorvastatin  10 mg Oral Daily  . chlorhexidine  10 mL Mouth/Throat TID  . feeding supplement (RESOURCE BREEZE)  1 Container Oral TID BM  . LORazepam  1 mg Intravenous Once  . magic mouthwash w/lidocaine  10 mL Oral QID  . pantoprazole (PROTONIX) IV  40 mg Intravenous Q12H  . polyethylene glycol  17 g Oral BID   Continuous Infusions: . sodium chloride 500 mL (09/05/13 1002)  . dextrose 5 % and 0.45% NaCl 1,000 mL with potassium chloride 20 mEq infusion 100 mL/hr at 09/05/13 0750     Cindi Ghazarian, DO  [facility] Pager [phone]  If 7PM-7AM, please contact night-coverage [email or web address] Password [email or web address] 09/05/2013, 1:44 PM   LOS: 3 days

## 2013-09-05 NOTE — Interval H&P Note (Signed)
History and Physical Interval Note:  09/05/2013 9:08 AM  Steven [name]  has presented today for surgery, with the diagnosis of dysphagia  The various methods of treatment have been discussed with the patient and family. After consideration of risks, benefits and other options for treatment, the patient has consented to  Procedure(s): ESOPHAGOGASTRODUODENOSCOPY (EGD) (N/A) as a surgical intervention .  The patient's history has been reviewed, patient examined, no change in status, stable for surgery.  I have reviewed the patient's chart and labs.  Questions were answered to the patient's satisfaction.     Pricilla Riffle. Fuller Plan MD

## 2013-09-05 NOTE — H&P (View-Only) (Signed)
Progress Note   Subjective  Sisters in room. Patient still vomiting or not swallowing PO. No BM in two days   Objective   Vital signs in last 24 hours: Temp:  [97.6 F (36.4 C)-99.1 F (37.3 C)] 99.1 F (37.3 C) (08/22 0615) Pulse Rate:  [79-96] 91 (08/22 0615) Resp:  [15-28] 24 (08/22 0615) BP: (111-132)/(66-74) 111/72 mmHg (08/22 0615) SpO2:  [94 %-96 %] 94 % (08/22 0615) Weight:  [102 lb 7.9 oz (46.49 kg)] 102 lb 7.9 oz (46.49 kg) (08/22 0500) Last BM Date: 09/02/13  General:    White male, chronically ill appearing, nonverbal, in NAD Abdomen:  Soft, nontender and nondistended. Normal bowel sounds. Extremities:  Without edema. Neurologic:  Alert     Lab Results:  Recent Labs  09/02/13 2143 09/03/13 0605 09/04/13 0756  WBC 6.7 6.1 9.2  HGB 12.7* 13.0 12.9*  HCT 41.8 42.3 40.8  PLT 383 436* 335   BMET  Recent Labs  09/02/13 2143 09/03/13 0605 09/04/13 0756  NA 148* 149* 145  K 3.6* 3.3* 3.8  CL 108 110 110  CO2 22 24 22   GLUCOSE 85 83 131*  BUN 13 11 5*  CREATININE 0.44* 0.43* 0.38*  CALCIUM 9.9 9.4 9.0   LFT  Recent Labs  09/03/13 0605  PROT 6.0  ALBUMIN 2.6*  AST 11  ALT 11  ALKPHOS 82  BILITOT 0.4   Studies/Results: Portable Chest 1 View  09/03/2013   CLINICAL DATA:  Evaluate for infiltrates  EXAM: PORTABLE CHEST - 1 VIEW  COMPARISON:  09/13/2013  FINDINGS: Suboptimal study due to patient rotation. There is no gross change in the normal heart size and mild aortic tortuosity. There is no edema, consolidation, effusion, or pneumothorax.  IMPRESSION: No evidence of active cardiopulmonary disease.   Electronically Signed   By: Jorje Guild M.D.   On: 09/03/2013 01:05   Dg Ugi W/o Kub  09/03/2013   CLINICAL DATA:  Nausea, vomiting. History of gastric and duodenal ulcers.  EXAM: UPPER GI SERIES WITHOUT KUB  TECHNIQUE: Routine upper GI series was performed with high-density barium.  FLUOROSCOPY TIME:  1 min 47 seconds  COMPARISON:  08/26/2013  radiographs.  FINDINGS: The patient was either unable or would not drink contrast. The patient could not be positioned in the standard supine position. Despite the lateral positioning in the fetal position, I attempted to proceed with this study however the patient would not ingest contrast. In an attempt to stimulate swallowing, 2 mL of thick barium contrast was placed in the patient's mouth. Over the course of 1 min and 30 seconds, the patient did eventually swallow and this tiny bolus was observed to enter the esophagus. At this point, this study was deemed not feasible and terminated.  IMPRESSION: Aborted upper GI study.  The patient would not drink contrast.  I discussed the case with Dr. Fuller Plan. The patient had a recent endoscopy and the upper GI study is to ensure continued patency. The patient will require an enteric tube and then an upper GI and study could be performed through the enteric tube. Given the patient's positioning, the upper GI study even with a tube will be limited but should be adequate to assess for gross patency of the upper GI tract.   Electronically Signed   By: Dereck Ligas M.D.   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  Large prepyloric ulcer and small duodenal ulcers on EGD late July. Unable/unwilling to take meds at home due to refusal to eat/vomiting. Attempted UGI series to evaluate for GOO yesterday but patient wouldn't drink contrast. NGT placement unsuccessful after 3 attempts. Per sisters who are aggravated at lack of progress, it usually takes about 10 minutes for patient to vomit after PO intake. He may have outlet obstruction. ENT evaluated yesterday and couldn't find any reason from ENT standpoint that patient wasn't taking PO. SLP did not find oropharnygeal problems. Continue IV PPI. UGI series via fluoroscopy placed NGT  today. If that is not successful will need repeat EGD. Further neurologic work up to evaluate for other reasons for dysphagia/refusal to eat.     LOS: 2 days   Tye Savoy  09/04/2013, 10:17 AM     Attending physician's note   I have taken an interval history, reviewed the chart and examined the patient. I agree with the Advanced Practitioner's note, impression and recommendations. Mgmt plans discussed with Dr. Carles Collet and 58 patients 2 sisters. If no reversible causes of his feeding difficulties are uncovered he may need a gastrostomy tube for nutritional support. Pending findings on UGI series today he may need EGD tomorrow. Patients sisters consent to proceed with EGD if needed. The risks, benefits, and alternatives to endoscopy with possible biopsy and possible dilation were discussed with the patients sisters and they consent to proceed.    Pricilla Riffle. Fuller Plan, MD [facility]

## 2013-09-05 NOTE — Progress Notes (Signed)
MRI called and said patient was unable to get MRI done because they could not get him to lay flat or be still even with family trying to comfort him. Family requesting sedation for MRI to be done. Let Dr. Carles Collet know and he said the risk is too high to sedate him and we already have an answer to the problem from the EGD so the MRI will just not be done. Will let family and MRI know of Dr. Doristine Devoid response.

## 2013-09-05 NOTE — Op Note (Signed)
[facility] [place], [id number]   ENDOSCOPY PROCEDURE REPORT PATIENT: Steven [name], Steven [name]  MR#: [id number] BIRTHDATE: [birth year] , 25  yrs. old GENDER: Male ENDOSCOPIST: Ladene Artist, MD, [facility] REFERRED BY:  [facility] PROCEDURE DATE:  09/05/2013 PROCEDURE:  EGD, diagnostic and Savary dilation of esophagus and fluoroscopy ASA CLASS:     Class III INDICATIONS:  Dysphagia.   abnormal UGI series results.   Weight loss.  Nausea. Vomiting. MEDICATIONS: These medications were titrated to patient response per physician's verbal order, Fentanyl 50 mcg IV, and Versed 5 mg IV TOPICAL ANESTHETIC: Cetacaine Spray DESCRIPTION OF PROCEDURE: After the risks benefits and alternatives of the procedure were thoroughly explained, informed consent was obtained.  The PENTAX GASTOROSCOPE [id number] endoscope was introduced through the mouth and advanced to the second portion of the duodenum. Without limitations.  The instrument was slowly withdrawn as the mucosa was fully examined.  ESOPHAGUS: A 4 cm severe stricture was found in the lower third of the esophagus.  The stenosis was non traversable with the endoscope.   There was LA Class D esophagitis noted.   The esophagus was otherwise normal. STOMACH: A single non-bleeding round and clean-based ulcer, measuring 5 x 11mm in size, was found in the prepyloric region of the stomach.   A deformity was found in the prepyloric region of the stomach and at the pylorus.   The stomach otherwise appeared normal. DUODENUM: A single non-bleeding round, shallow and clean-based ulcer, measuring 6 x 6mm in size, was found in the duodenal bulb. The duodenal mucosa showed no abnormalities in the 2nd part of the duodenum.  Retroflexed views revealed a large hiatal hernia.     A guidewire was placed under endoscopic and fluoroscopic guidance. 9, 10, 11, 12 mm Savary dilator passed under fluoroscopic guidance with mild to  moderate reisistance and mild heme noted with all 4 dilator. The scope was then withdrawn from the patient and the procedure completed. COMPLICATIONS: There were no complications. ENDOSCOPIC IMPRESSION: 1.   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   LA Class D esophagitis 3.   Single non-bleeding ulcer in the prepyloric region of the stomach 4.   Deformity in the pyloric/prepyloric region of the stomach 5.   Single non-bleeding ulcer in the duodenal bulb 6.   Large hiatal hernia  RECOMMENDATIONS: 1.  Anti-reflux regimen long term 2.  Continue PPI bid long term and avoid GI tract irritants 3.  post dilation instructions 4.  Repeat EGD with dilation in a few days  eSigned:  Ladene Artist, MD, [facility] 09/05/2013 10:06 AM

## 2013-09-06 ENCOUNTER — Encounter (HOSPITAL_COMMUNITY): Payer: Self-pay | Admitting: Gastroenterology

## 2013-09-06 LAB — BASIC METABOLIC PANEL WITH GFR
Anion gap: 10 (ref 5–15)
BUN: 3 mg/dL — ABNORMAL LOW (ref 6–23)
CO2: 23 meq/L (ref 19–32)
Calcium: 8.7 mg/dL (ref 8.4–10.5)
Chloride: 102 meq/L (ref 96–112)
Creatinine, Ser: 0.31 mg/dL — ABNORMAL LOW (ref 0.50–1.35)
GFR calc Af Amer: >90 mL/min
GFR calc non Af Amer: >90 mL/min
Glucose, Bld: 118 mg/dL — ABNORMAL HIGH (ref 70–99)
Potassium: 3.8 meq/L (ref 3.7–5.3)
Sodium: 135 meq/L — ABNORMAL LOW (ref 137–147)

## 2013-09-06 MED ORDER — SODIUM CHLORIDE 0.9 % IV SOLN
INTRAVENOUS | Status: DC
  Administered 2013-09-06: 21:00:00 via INTRAVENOUS

## 2013-09-06 NOTE — Care Management Note (Signed)
    Page 1 of 2   09/13/2013     3:46:27 PM CARE MANAGEMENT NOTE 09/13/2013  Patient:  Steven [name], Steven [name]   Account Number:  [id number]  Date Initiated:  09/06/2013  Documentation initiated by:  Tomi Bamberger  Subjective/Objective Assessment:   dx n/v, ftt, dysphagia  admit- from group home, [name].  Active with [facility] for HHRN, Pt.     Action/Plan:   8/23- dialation  8/25 for dialation.   Anticipated DC Date:  09/06/2013   Anticipated DC Plan:  SKILLED NURSING FACILITY  In-house referral  Clinical Social Worker      DC Planning Services  CM consult      [facility] Choice  HOME HEALTH   Choice offered to / List presented to:             Status of service:  Completed, signed off Medicare Important Message given?  YES (If response is "NO", the following Medicare IM given date fields will be blank) Date Medicare IM given:  09/06/2013 Medicare IM given by:  Tomi Bamberger Date Additional Medicare IM given:  09/13/2013 Additional Medicare IM given by:  Tomi Bamberger  Discharge Disposition:  [place]  Per UR Regulation:  Reviewed for med. necessity/level of care/duration of stay  If discussed at [place] of Stay Meetings, dates discussed:    Comments:  09/13/13 [place], BSN [phone] received level 2 passar, patient for dc to snf today.  09/09/13 [place], BSN [phone] awaiting level 2 passar.  09/08/13 [place], BSN [phone] awaiting level 2 passar, advancing diet.  09/06/13 [place], BSN [phone] patient is s/p dialation on 8/23 and will have another tomorrow.  Patient has lost 33% of weight since last admission.  NCM will continue to follow for dc needs.  ( if family decides on peg tube patient will need to go to snf because group home can not handle this level of care.)

## 2013-09-06 NOTE — Progress Notes (Signed)
Chart review complete.  Patient is not eligible for [facility] Care Management services because his/her PCP is not a [facility] primary care provider or is not [facility] affiliated.  For any additional questions or new referrals please contact Tim Henderson BSN RN [facility] Liaison at [phone] °

## 2013-09-06 NOTE — Evaluation (Signed)
Physical Therapy Evaluation Patient Details Name: Steven [name] MRN: [id number] DOB: 05/11/1955 Today's Date: 09/06/2013   History of Present Illness  Patient is a 58 y/o male with history of MR recently discharged from hospital on 8/1 following similar presentation with poor by mouth intake and weight loss. EGD revealed Large ulcer  in the prepyloric region of the stomach and multiple small medium sized non-bleeding ulcers in the duodenal bulb. S/p EGD 09/05/13--severe esophageal stricture, grade D esophagitis. Plan for dialation 8/25.    Clinical Impression  Patient presents with BLE contractures in hips/knees/ankles limiting bed mobility and transfers.Pt will not be able to stand due to above deficits. Pt with increased pain with any movement of extremities/rolling in bed. Pt refusing transfer to chair. Pt not able to tolerate any mobility or passive ROM of BLEs due to pain. Pt requires 2 person total A for all mobility due to weakness, contractures and pain. Pt would benefit from follow up ST SNF and 24/7 care at discharge to improve mobility, decrease contractures and ease burden of care.   Follow Up Recommendations SNF;Supervision/Assistance - 24 hour    Equipment Recommendations  None recommended by PT    Recommendations for Other Services       Precautions / Restrictions Precautions Precautions: Fall Precaution Comments: Contractures BLEs. Restrictions Weight Bearing Restrictions: No      Mobility  Bed Mobility Overal bed mobility: +2 for physical assistance;Needs Assistance Bed Mobility: Rolling Rolling: Total assist         General bed mobility comments: Rolling to Rt/lft with total A. Increased pain. Did not appear to give any effort during transfer. Supine to sit not attempted due to increased pain with any movement of extremities. May not be able to due to severe contractures in BLEs.  Transfers                 General transfer comment:  TBA.  Ambulation/Gait                Stairs            Wheelchair Mobility    Modified Rankin (Stroke Patients Only)       Balance                                             Pertinent Vitals/Pain Pain Assessment: 0-10 Pain Score:  (Not rated on pain scale) Pain Location: Yelping in pain with any movement of BLEs. Pain Descriptors / Indicators: Guarding;Grimacing Pain Intervention(s): Limited activity within patient's tolerance;Repositioned    Home Living Family/patient expects to be discharged to:: Skilled nursing facility                      Prior Function Level of Independence: Needs assistance   Gait / Transfers Assistance Needed: Pt non ambulatory. W/c bound at baseline but requires assist with squat pivot transfers of 1-2 people depending on the day. History obtained from last visit 3 weeks ago as Financial risk analyst not present today.  ADL's / Homemaking Assistance Needed: Total A for ADLs.        Hand Dominance   Dominant Hand: Right    Extremity/Trunk Assessment   Upper Extremity Assessment: Difficult to assess due to impaired cognition;RUE deficits/detail;LUE deficits/detail RUE Deficits / Details: Elbow flexion contracture. Weak grip. RUE: Unable to fully assess due to pain   LUE  Deficits / Details: Elbow flexion contracture. Weak grip.   Lower Extremity Assessment: RLE deficits/detail;LLE deficits/detail;Difficult to assess due to impaired cognition RLE Deficits / Details: ankle plantarflexion and knee/hip flexion contracture. Unable to extend extremities due to pain with any movement. LLE Deficits / Details: ankle plantarflexion and knee/hip flexion contracture. Unable to extend extremities due to pain with any movement.     Communication   Communication:  (very little verbalization- mainly nods yes/no.)  Cognition Arousal/Alertness: Awake/alert Behavior During Therapy: WFL for tasks  assessed/performed Overall Cognitive Status: History of cognitive impairments - at baseline (Known to therapist from previous admission.)                      General Comments General comments (skin integrity, edema, etc.): Wound on left heel with bandage present.    Exercises        Assessment/Plan    PT Assessment Patient needs continued PT services  PT Diagnosis Generalized weakness   PT Problem List Decreased strength;Pain;Decreased range of motion;Decreased cognition;Impaired tone;Decreased activity tolerance;Decreased mobility  PT Treatment Interventions Functional mobility training;Therapeutic activities;Therapeutic exercise   PT Goals (Current goals can be found in the Care Plan section) Acute Rehab PT Goals Patient Stated Goal: none stated PT Goal Formulation: Patient unable to participate in goal setting    Frequency Min 2X/week   Barriers to discharge        Co-evaluation               End of Session   Activity Tolerance: Patient limited by pain Patient left: in bed;with call bell/phone within reach;with bed alarm set Nurse Communication: Mobility status;Need for lift equipment;Precautions         Time: 6010-9323 PT Time Calculation (min): 14 min   Charges:   PT Evaluation $Initial PT Evaluation Tier I: 1 Procedure     PT G CodesCandy Sledge A 09/06/2013, 4:18 PM Candy Sledge, PT, DPT [phone]

## 2013-09-06 NOTE — Progress Notes (Signed)
PROGRESS NOTE  Steven [name] OHY:[id number] DOB: Jun 03, 1960 DOA: 09/02/2013 PCP: [name], MD  Assessment/Plan: dehydration with hypernatremia  Due to little or no PO intake.  Received D5 1/2NS prior to esophageal dilation-->improved Now able to tolerate liquids--d/c D5 1/2NS  Esphagitis/Esophageal Stricture -contributing to pt's dysphagia -09/05/2013 EGD--severe esophageal stricture or lower third of the esophagus, grade D esophagitis, nonbleeding ulcers in the prepyloric stand duodenal bulb -suspect pt has pain and did not WANT to swallow  -after initial dilatation-->pt took clear liquids and swallowed -repeat dilatation 09/08/13 Repeat clinical swallow eval.-->dysphagia 3 Family was initially very angry- did not feel enough was being done. Now improved.   ENT--Dr. [name] performed laryngoscopy which was negative for any abscess or obstruction  Dr. [name] also attempted to place NG tube laryngoscope, but met resistance  Family wants a PEG if we are unable to restore his ability to swallow.  On BID PPI IV. Bx negative for H pylori.  NSAIDS stopped.  Patient tolerating clears.  Dysphagia -Had a worsening dysphagia and right greater than left lower extremity weakness prior to admission family wanted to pursue MRI brain -Patient wasn't able to tolerate conventional MRI due to his contracture -Will coordinate MRI brain after the patient receives conscious sedation for his EGD dilatation on 09/08/2048 Severe Malnutrition/failure to thrive.  Patient has lost 32% of his body weight in 5 weeks!  Appreciate nutrition consultation  family still desires gastrostomy tube if pt still unable or not wanting to swallow TSH 2.380  Serum B12--1109  A.m. cortisol 23.5   Contracture PT for ROM Attempting MRI under propofol per anesthesia immediately after esophageal dilation on Wednesday 8/26 at 1:00 pm. Family desires SNF at discharge.  Diarrhea c-diff negative. 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  Patient currently dehydrated to euvolemic - not volume overloaded.  Iron deficiency  Given a dose of IV Nulecit  difficult situation due to pt's chronic constipation to give po iron   Family Communication: Sisters & extended family updated in conference area.--total time spent 90 min with >50% spend coordinating care Disposition Plan: SNF at discharge.    Procedures/Studies: Dg Chest 2 View  08/07/2013   CLINICAL DATA:  Rule out infection  EXAM: CHEST  2 VIEW  COMPARISON:  01/27/2012.  FINDINGS: Low volume film with airspace disease in the right mid lung compatible with pneumonia. Left lung is clear. Left upper lobe nodular density seen on the previous study is not evident on today's exam. The cardio pericardial silhouette is enlarged. No edema or pleural effusion. Bones are diffusely demineralized.  IMPRESSION: Right midlung airspace disease, suspicious for pneumonia.   Electronically Signed   By: Misty Stanley M.D.   On: 08/07/2013 21:04   Dg Ankle 2 Views Left  08/13/2013   CLINICAL DATA:  Left ankle pain  EXAM: LEFT ANKLE - 2 VIEW  COMPARISON:  None  FINDINGS: Three views of the left ankle submitted. No acute fracture or subluxation. No radiopaque foreign body.  IMPRESSION: Negative.   Electronically Signed   By: Lahoma Crocker M.D.   On: 08/13/2013 16:09   Portable Chest 1 View  09/03/2013   CLINICAL DATA:  Evaluate for infiltrates  EXAM: PORTABLE CHEST - 1 VIEW  COMPARISON:  09/13/2013  FINDINGS: Suboptimal study due to patient rotation. There is no gross change in the normal heart size and mild aortic tortuosity. There is no edema,  consolidation, effusion, or pneumothorax.  IMPRESSION: No evidence of active cardiopulmonary disease.   Electronically Signed   By: Jorje Guild M.D.   On: 09/03/2013 01:05   Dg Chest Port 1 View  08/10/2013   CLINICAL DATA:  Dyspnea  EXAM: PORTABLE CHEST - 1 VIEW   COMPARISON:  08/09/2013  FINDINGS: A left-sided central venous line and nasogastric catheter are again noted in satisfactory position. The cardiac shadow remains enlarged. Patient is significantly rotated to the right accentuating the mediastinal markings. Bibasilar infiltrative changes are again seen slightly worse on the right than the left. No acute bony abnormality is noted.  IMPRESSION: Persistent bibasilar changes.   Electronically Signed   By: Inez Catalina M.D.   On: 08/10/2013 12:59   Dg Chest Port 1 View  08/09/2013   CLINICAL DATA:  Decreased O2 saturation.  EXAM: PORTABLE CHEST - 1 VIEW 01:03 a.m.  COMPARISON:  08/08/2013 and 08/07/2013  FINDINGS: The patient has developed and increased infiltrate at the right lung base with new atelectasis and an infiltrate in the left perihilar region as well as new small right pleural effusion. I suspect the findings represent pulmonary edema.  NG tube tip is below the diaphragm. Central venous catheter tip is at the cavoatrial junction.  IMPRESSION: Progressive infiltrate at the right base with new small right effusion.  New atelectasis in faint infiltrate in the left. I suspect the findings represent pulmonary edema.   Electronically Signed   By: [name] M.D.   On: 08/09/2013 01:26   Dg Chest Port 1 View  08/08/2013   CLINICAL DATA:  Central line placement.  EXAM: PORTABLE CHEST - 1 VIEW  COMPARISON:  Chest radiograph performed 08/07/2013  FINDINGS: The patient's left subclavian line is seen ending about the cavoatrial junction.  Lung expansion is improved, though evaluation is suboptimal due to patient rotation. Patchy right-sided airspace opacities raise concern for pneumonia, perhaps slightly more confluent than on the prior study. The left lung appears clear. No pleural effusion or pneumothorax is seen.  The cardiomediastinal silhouette is normal in size. No acute osseous abnormalities are identified.  IMPRESSION: 1. Left subclavian line seen ending  about the cavoatrial junction. 2. Patchy right-sided airspace opacities raise concern for pneumonia, perhaps slightly more confluent than on the prior study.   Electronically Signed   By: Garald Balding M.D.   On: 08/08/2013 01:49   Dg Abd Acute W/chest  08/26/2013   CLINICAL DATA:  Emesis.  Poorly eating.  EXAM: ACUTE ABDOMEN SERIES (ABDOMEN 2 VIEW & CHEST 1 VIEW)  COMPARISON:  Chest x-ray a 08/10/2013. Abdominal radiograph 08/08/2013.  FINDINGS: Lung volumes are low. Slight coarsening of interstitial markings, similar to prior examinations. No consolidative airspace disease. No pleural effusions. No pneumothorax. No pulmonary nodule or mass noted. Pulmonary vasculature and the cardiomediastinal silhouette are within normal limits.  Gas and stool are seen scattered throughout the colon extending to the level of the distal rectum. No pathologic distension of small bowel is noted. No gross evidence of pneumoperitoneum. Multiple nondilated gas-filled loops of small bowel throughout the central abdomen.  IMPRESSION: 1. Nonspecific, nonobstructive bowel gas pattern. 2. No pneumoperitoneum. 3. Low lung volumes without radiographic evidence of acute cardiopulmonary disease.   Electronically Signed   By: Vinnie Langton M.D.   On: 08/26/2013 18:58   Dg Abd Portable 1v  08/08/2013   CLINICAL DATA:  NG tube placement.  EXAM: PORTABLE ABDOMEN - 1 VIEW  COMPARISON:  None.  FINDINGS: An NG  tube is identified with tip overlying the proximal-mid stomach.  The bowel gas pattern is unremarkable.  IMPRESSION: NG tube with tip overlying the proximal -mid stomach.   Electronically Signed   By: [name] M.D.   On: 08/08/2013 15:55   Dg Loyce Dys Tube Plc W/fl W/rad  09/04/2013   CLINICAL DATA:  Unable to pass nasogastric tube.  EXAM: NASO G TUBE PLACEMENT WITH FL AND WITH RAD  TECHNIQUE: Under direct fluoroscopic guidance the nasogastric tube was attempted to be passed into the stomach.  CONTRAST:  10 cc of thin barium   FLUOROSCOPY TIME:  5 min  COMPARISON:  None  FINDINGS: Initially the nasogastric tube was introduced through the left nostril and into the mid esophagus. The nasogastric tube was unable to be passed into the distal esophagus. A few cc of barium or injected into the nasogastric tube opacifying the mid and distal esophagus. Within the distal half of the esophagus there is a long segment of marked luminal narrowing. This appears to account for the inability to pass nasogastric tube into the stomach.  IMPRESSION: 1. Apparent long segment of marked luminal narrowing involving the distal esophagus through which the nasogastric tube was unable to be passed. This may represent a benign or malignant stricture. Endoscopic evaluation is recommended.   Electronically Signed   By: Kerby Moors M.D.   On: 09/04/2013 16:21   Dg Ugi W/o Kub  09/03/2013   CLINICAL DATA:  Nausea, vomiting. History of gastric and duodenal ulcers.  EXAM: UPPER GI SERIES WITHOUT KUB  TECHNIQUE: Routine upper GI series was performed with high-density barium.  FLUOROSCOPY TIME:  1 min 47 seconds  COMPARISON:  08/26/2013 radiographs.  FINDINGS: The patient was either unable or would not drink contrast. The patient could not be positioned in the standard supine position. Despite the lateral positioning in the fetal position, I attempted to proceed with this study however the patient would not ingest contrast. In an attempt to stimulate swallowing, 2 mL of thick barium contrast was placed in the patient's mouth. Over the course of 1 min and 30 seconds, the patient did eventually swallow and this tiny bolus was observed to enter the esophagus. At this point, this study was deemed not feasible and terminated.  IMPRESSION: Aborted upper GI study.  The patient would not drink contrast.  I discussed the case with Dr. Fuller Plan. The patient had a recent endoscopy and the upper GI study is to ensure continued patency. The patient will require an enteric tube and  then an upper GI and study could be performed through the enteric tube. Given the patient's positioning, the upper GI study even with a tube will be limited but should be adequate to assess for gross patency of the upper GI tract.   Electronically Signed   By: Dereck Ligas M.D.   On: 09/03/2013 15:04   Dg Swallowing Func-speech Pathology  08/12/2013   Katherene Ponto Deblois, CCC-SLP     08/12/2013  2:59 PM Objective Swallowing Evaluation: Modified Barium Swallowing Study   Patient Details  Name: NATHANYAL ASHMEAD MRN: [id number] Date of Birth: 1955/10/31  Today's Date: 08/12/2013 Time: 1305-1340 SLP Time Calculation (min): 35 min  Past Medical History:  Past Medical History  Diagnosis Date  . Hyperlipidemia   . Anxiety   . Mental retardation     ue to temp of 108 aftrer having measles age 10.  . Hx of diaper rash   . Edema of foot 02/06/2012  BILATERAL   Past Surgical History:  Past Surgical History  Procedure Laterality Date  . Orif ankle fracture  02/06/2012    RIGHT ANKLE  . Orif ankle fracture  02/06/2012    Procedure: OPEN REDUCTION INTERNAL FIXATION (ORIF) ANKLE  FRACTURE;  Surgeon: Wylene Simmer, MD;  Location: [facility];  Service:  Orthopedics;  Laterality: Right;  . Syndesmosis repair  02/06/2012    Procedure: SYNDESMOSIS REPAIR;  Surgeon: Wylene Simmer, MD;   Location: [facility];  Service: Orthopedics;  Laterality: Right;  tri  maleolar  . Colonoscopy N/A 08/11/2013    Procedure: COLONOSCOPY;  Surgeon: Gatha Mayer, MD;   Location: [facility];  Service: Endoscopy;  Laterality: N/A;  . Esophagogastroduodenoscopy N/A 08/11/2013    Procedure: ESOPHAGOGASTRODUODENOSCOPY (EGD);  Surgeon: Gatha Mayer, MD;  Location[facility] ENDOSCOPY;  Service: Endoscopy;   Laterality: N/A;   HPI:  58 year old male with a history of mental retardation,  hyperlipidemia was taken to urgent care because of poor by mouth  intake. Apparently the patient has lost a significant amount of  weight over the last few weeks. The patient was  transferred to  the ED and noted to be hypotensive and have leukocytosis with WBC  18.1.  He was seen by GI, determined to have a bleed and EGD  showed large ulcers, suspected to be caused by NSAID.  The  patient was started on intravenous antibiotics for sepsis  secondary to healthcare associated pneumonia and possible  aspiration (right base infiltrate).      Assessment / Plan / Recommendation Clinical Impression  Dysphagia Diagnosis: Mild oral phase dysphagia;Mild pharyngeal  phase dysphagia Clinical impression: Pt presents with a mild oral dysphagia with  tongue pumping, anterior mastication due to missing posterior  dentition. Is able to manage soft solids well. Pt does have a  delayed swallow response, though with consecutive boluses pt is  able to maintain hyolaryngeal elevation and airway protection.  There was one instance of trace penetration above the cords with  mild pyriform sinuses residuals present post swallow with large  sips. At this time, aspiration risk appears quite low. Pt is  recommended to consume mechanical soft diet with thin liquids  with normal PO intake habits (self feeding though somewhat  impulsive). If family notices possible signs of aspiration,  recommend full supervision with POs for small single sips,  restricting straws. No SLP f/u needed at this time.     Treatment Recommendation  No treatment recommended at this time    Diet Recommendation Dysphagia 3 (Mechanical Soft);Thin liquid   Liquid Administration via: Cup;Straw Medication Administration: Whole meds with puree Supervision: Staff to assist with self feeding Compensations: Slow rate;Small sips/bites Postural Changes and/or Swallow Maneuvers: Seated upright 90  degrees    Other  Recommendations Oral Care Recommendations: Oral care BID   Follow Up Recommendations  Other (comment) (group home)    Frequency and Duration        Pertinent Vitals/Pain NA    SLP Swallow Goals     General HPI: 58 year old male with a history of  mental  retardation, hyperlipidemia was taken to urgent care because of  poor by mouth intake. Apparently the patient has lost a  significant amount of weight over the last few weeks. The patient  was transferred to the ED and noted to be hypotensive and have  leukocytosis with WBC 18.1.  He was seen by GI, determined to  have a bleed and EGD showed large ulcers, suspected to  be caused  by NSAID.  The patient was started on intravenous antibiotics for  sepsis secondary to healthcare associated pneumonia and possible  aspiration (right base infiltrate).  Type of Study: Modified Barium Swallowing Study Reason for Referral: Objectively evaluate swallowing function Previous Swallow Assessment: none Diet Prior to this Study: Other (Comment) (SOFT) Temperature Spikes Noted: No Respiratory Status: Room air History of Recent Intubation: No Behavior/Cognition: Alert;Cooperative;Pleasant mood;Doesn't  follow directions Oral Cavity - Dentition: Missing dentition Oral Motor / Sensory Function: Within functional limits Self-Feeding Abilities: Able to feed self;Needs assist Patient Positioning: Upright in chair Baseline Vocal Quality: Clear Volitional Cough: Cognitively unable to elicit Volitional Swallow: Unable to elicit Anatomy: Within functional limits Pharyngeal Secretions: Not observed secondary MBS    Reason for Referral Objectively evaluate swallowing function   Oral Phase Oral Preparation/Oral Phase Oral Phase: Impaired Oral - Thin Oral - Thin Cup: Lingual pumping Oral - Thin Straw: Lingual pumping Oral - Solids Oral - Puree: Lingual pumping Oral - Mechanical Soft: Impaired mastication (only anterior  dentition)   Pharyngeal Phase Pharyngeal Phase Pharyngeal Phase: Impaired Pharyngeal - Thin Pharyngeal - Thin Cup: Delayed swallow initiation;Pharyngeal  residue - pyriform sinuses Pharyngeal - Thin Straw: Delayed swallow initiation;Pharyngeal  residue - pyriform sinuses;Penetration/Aspiration after swallow  Penetration/Aspiration details (thin straw): Material enters  airway, remains ABOVE vocal cords then ejected out Pharyngeal - Solids Pharyngeal - Puree: Delayed swallow initiation Pharyngeal - Mechanical Soft: Delayed swallow initiation  Cervical Esophageal Phase    GO    Cervical Esophageal Phase Cervical Esophageal Phase: WFL (appearance of possible hiatal  hernia, no radiologist present)        Herbie Baltimore, MA CCC-SLP [phone]  DeBlois, Katherene Ponto 08/12/2013, 2:57 PM          Subjective: Patient is awake and does not appear in pain.  Very contracted (legs in chest) unable to speak.  Objective: Filed Vitals:   09/05/13 1043 09/05/13 2210 09/06/13 0551 09/06/13 1325  BP: 122/73 116/60 110/68 133/57  Pulse: 77 74 77 75  Temp: 97.7 F (36.5 C) 98.4 F (36.9 C) 98.6 F (37 C) 98.4 F (36.9 C)  TempSrc: Axillary Oral Oral Oral  Resp: _0 Height:      Weight:   46.131 kg (101 lb 11.2 oz)   SpO2: 98% 97% 98% 96%    Intake/Output Summary (Last 24 hours) at 09/06/13 1519 Last data filed at 09/06/13 1430  Gross per 24 hour  Intake 1577.67 ml  Output    700 ml  Net 877.67 ml   Weight change: 0 kg (0 oz) Exam:   General:  Pt is alert,  not in acute distress, unable to follow commands due to MR.  HEENT: No icterus, No thrush,[place]/AT  Cardiovascular: slightly tachy, S1/S2, no rubs, no gallops  Respiratory: Poor inspiratory effort but clear to auscultation. No wheezing.  Abdomen: Soft/+BS, non tender, non distended, no guarding  Extremities: No edema, No lymphangitis, No petechiae, No rashes  Data Reviewed: Basic Metabolic Panel:  Recent Labs Lab 09/02/13 2143 09/03/13 0605 09/04/13 0756 09/05/13 0540 09/06/13 0525  NA 148* 149* 145 143 135*  K 3.6* 3.3* 3.8 3.8 3.8  CL 108 110 110 108 102  CO2 _1 GLUCOSE 85 83 131* 148* 118*  BUN 13 11 5* 3* 3*  CREATININE 0.44* 0.43* 0.38* 0.34* 0.31*  CALCIUM 9.9 9.4 9.0 9.0 8.7   Liver Function  Tests:  Recent Labs Lab 09/02/13 2143 09/03/13  0605  AST 14 11  ALT 10 11  ALKPHOS 82 82  BILITOT 0.4 0.4  PROT 6.3 6.0  ALBUMIN 2.6* 2.6*   CBC:  Recent Labs Lab 09/02/13 2143 09/03/13 0605 09/04/13 0756  WBC 6.7 6.1 9.2  HGB 12.7* 13.0 12.9*  HCT 41.8 42.3 40.8  MCV 89.9 91.4 90.7  PLT 383 436* 335   Cardiac Enzymes:  Recent Labs Lab 09/04/13 0756  CKTOTAL 49     Recent Results (from the past 240 hour(s))  URINE CULTURE     Status: None   Collection Time    09/03/13  6:16 AM      Result Value Ref Range Status   Specimen Description URINE, CATHETERIZED   Final   Special Requests NONE   Final   Culture  Setup Time     Final   Value: 09/03/2013 14:28     Performed at [place]     Final   Value: >=100,000 COLONIES/ML     Performed at [facility]   Culture     Final   Value: Multiple bacterial morphotypes present, none predominant. Suggest appropriate recollection if clinically indicated.     Performed at [facility]   Report Status 09/04/2013 FINAL   Final  CLOSTRIDIUM DIFFICILE BY PCR     Status: None   Collection Time    09/05/13  5:18 PM      Result Value Ref Range Status   C difficile by pcr NEGATIVE  NEGATIVE Final     Scheduled Meds: . antiseptic oral rinse  7 mL Mouth Rinse BID  . atorvastatin  10 mg Oral Daily  . chlorhexidine  10 mL Mouth/Throat TID  . feeding supplement (RESOURCE BREEZE)  1 Container Oral TID BM  . magic mouthwash w/lidocaine  10 mL Oral QID  . pantoprazole (PROTONIX) IV  40 mg Intravenous Q12H  . polyethylene glycol  17 g Oral BID   Continuous Infusions: . sodium chloride 500 mL (09/05/13 1002)  . dextrose 5 % and 0.45% NaCl 1,000 mL with potassium chloride 20 mEq infusion 100 mL/hr at 09/06/13 1308     Karen Kitchens [facility] Pager [phone]  If 7PM-7AM, please contact night-coverage [email or web address] Password [email or web address] 09/06/2013, 3:19 PM   LOS: 4 days    Attending  Patient was seen, examined,treatment plan was discussed with the Physician extender. I have directly reviewed the clinical findings, lab, imaging studies and management of this patient in detail. I have made the necessary changes to the above noted documentation, and agree with the documentation, as recorded by the Physician extender.  Orson Eva, DO [phone]

## 2013-09-06 NOTE — Progress Notes (Signed)
[facility]  Patient Status: Active (receiving services up to time of hospitalization)  [facility] is providing the following services: RN and PT  If patient discharges after hours, please call [phone].   Consepcion Hearing 09/06/2013, 10:11 AM

## 2013-09-06 NOTE — Progress Notes (Signed)
Agree with Ms. Guenther's assessment and plan. Ricardo Schubach E. Airen Dales, MD, FACG   

## 2013-09-06 NOTE — Progress Notes (Addendum)
[facility] Progress Note    Since last GI note: EGD with dilation yesterday, Dr. Fuller Plan.  Tolerating liquids yesterday and today very well. Several family members in the room, very happy he is swallowing again.  Objective: Vital signs in last 24 hours: Temp:  [98.4 F (36.9 C)-98.6 F (37 C)] 98.6 F (37 C) (08/24 0551) Pulse Rate:  [74-77] 77 (08/24 0551) Resp:  [16-18] 16 (08/24 0551) BP: (110-116)/(60-68) 110/68 mmHg (08/24 0551) SpO2:  [97 %-98 %] 98 % (08/24 0551) Weight:  [101 lb 11.2 oz (46.131 kg)] 101 lb 11.2 oz (46.131 kg) (08/24 0551) Last BM Date: 09/05/13 General: alert and oriented times 0 Heart: regular rate and rythm Abdomen: contractured    Lab Results:  Recent Labs  09/04/13 0756  WBC 9.2  HGB 12.9*  PLT 335  MCV 90.7    Recent Labs  09/04/13 0756 09/05/13 0540 09/06/13 0525  NA 145 143 135*  K 3.8 3.8 3.8  CL 110 108 102  CO2 22 25 23   GLUCOSE 131* 148* 118*  BUN 5* 3* 3*  CREATININE 0.38* 0.34* 0.31*  CALCIUM 9.0 9.0 8.7   Medications: Scheduled Meds: . antiseptic oral rinse  7 mL Mouth Rinse BID  . atorvastatin  10 mg Oral Daily  . chlorhexidine  10 mL Mouth/Throat TID  . feeding supplement (RESOURCE BREEZE)  1 Container Oral TID BM  . magic mouthwash w/lidocaine  10 mL Oral QID  . pantoprazole (PROTONIX) IV  40 mg Intravenous Q12H  . polyethylene glycol  17 g Oral BID   Continuous Infusions: . sodium chloride 500 mL (09/05/13 1002)  . dextrose 5 % and 0.45% NaCl 1,000 mL with potassium chloride 20 mEq infusion 100 mL/hr at 09/06/13 0748   PRN Meds:.acetaminophen, ondansetron (ZOFRAN) IV, ondansetron, sennosides    Assessment/Plan: 58 y.o. 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  Need to continue twice daily PPI, can change to oral tomorrow morning since he is eating again.  Need to continue relfux precautions which I reviewed with  family today. Will plan to repeat dilation on Wednesday (12:30 pm in endoscopy), he is OK to d/c from GI perspective after that procedure.  Will need to make him NPO after MN on Tuesday night.    Milus Banister, MD  09/06/2013, 1:05 PM [facility] Pager [phone]

## 2013-09-07 ENCOUNTER — Encounter (HOSPITAL_COMMUNITY): Payer: Self-pay | Admitting: Anesthesiology

## 2013-09-07 MED ORDER — ENOXAPARIN SODIUM 40 MG/0.4ML ~~LOC~~ SOLN
40.0000 mg | SUBCUTANEOUS | Status: DC
Start: 2013-09-07 — End: 2013-09-13
  Administered 2013-09-07 – 2013-09-13 (×6): 40 mg via SUBCUTANEOUS
  Filled 2013-09-07 (×7): qty 0.4

## 2013-09-07 MED ORDER — PANTOPRAZOLE SODIUM 40 MG PO TBEC
40.0000 mg | DELAYED_RELEASE_TABLET | Freq: Two times a day (BID) | ORAL | Status: DC
  Administered 2013-09-07 – 2013-09-10 (×7): 40 mg via ORAL
  Filled 2013-09-07 (×6): qty 1

## 2013-09-07 NOTE — Progress Notes (Signed)
OT Cancellation Note  Patient Details Name: Steven [name] MRN: [id number] DOB: 10-Sep-1955   Cancelled Treatment:    Reason Eval/Treat Not Completed: Other (comment) Pt is Medicare and current D/C plan is SNF. No apparent immediate acute care OT needs, therefore will defer OT to SNF. If OT eval is needed please call Acute Rehab Dept. at [phone] or text page OT at [phone].    Almon Register [phone] 09/07/2013, 11:05 AM

## 2013-09-07 NOTE — Progress Notes (Signed)
PROGRESS NOTE  Steven [name] FMB:[id number] DOB: 1955/03/15 DOA: 09/02/2013 PCP: [name], MD Interim summary 58 year old male with a history of mental retardation, hyperlipidemia was taken to urgent care because of poor by mouth intake. Apparently the patient has lost a significant amount of weight over the last few weeks. The patient was discharged from [facility] on 08/14/2013 after an admission for sepsis and acute blood loss anemia secondary to NSAID-induced gastric and duodenal ulcerations. His colonoscopy was normal. He was discharged to the group home. He was readmitted due to 2 failure to thrive, dysphasia, and decreased oral intake. The patient was fluid resuscitated for his dehydration. 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 He underwent dilatation at that time.. The patient was seen by ENT was performed upper airway laryngoscopy. This was negative. Because of the patient's progressive dysphagia and reported lower extremity weakness, MRI of the brain has been planned. Because of the patient's contractures and agitation, this has been coordinated to be performed under conscious sedation after his endoscopy on 09/08/2013. The patient is to have repeat EGD with esophageal dilatation on 09/08/2013.   Assessment/Plan: dehydration with hypernatremia  Due to little or no PO intake.  Received D5 1/2NS prior to esophageal dilation-->improved  Now able to tolerate liquids--d/c D5 1/2NS  Esphagitis/Esophageal Stricture  -contributing to pt's dysphagia  -09/05/2013 EGD--severe esophageal stricture of lower third of the esophagus, grade D esophagitis, nonbleeding ulcers in the prepyloric stand duodenal bulb  -suspect pt has pain and did not WANT to swallow  -after initial dilatation-->pt took clear liquids and swallowed  -repeat dilatation 09/08/13  Repeat clinical swallow eval.-->dysphagia 3  Family was initially very angry-  did not feel enough was being done. Now improved.  ENT--Dr. [name] performed laryngoscopy which was negative for any abscess or obstruction  Dr. [name] also attempted to place NG tube laryngoscope, but met resistance  Family wants a PEG if we are unable to restore his ability to swallow.  On BID PPI IV. Bx negative for H pylori.  NSAIDS stopped.  Patient tolerating clears.  Dysphagia  -Had a worsening dysphagia and right greater than left lower extremity weakness prior to admission family wanted to pursue MRI brain  -Patient wasn't able to tolerate conventional MRI due to his contracture  -Will coordinate MRI brain after the patient receives conscious sedation for his EGD dilatation on 09/08/2013  Severe Malnutrition/failure to thrive.  Patient has lost 32% of his body weight in 5 weeks!  Appreciate nutrition consultation  family still desires gastrostomy tube if pt still unable or not wanting to swallow  TSH 2.380  Serum B12--1109  A.m. cortisol 23.5  Contracture  PT for ROM  Attempting MRI under propofol per anesthesia immediately after esophageal dilation on Wednesday 8/26 at 1:00 pm.  Family desires SNF at discharge.  Diarrhea  c-diff negative.  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  Patient currently dehydrated to euvolemic - not volume overloaded.  Iron deficiency  Given a dose of IV Nulecit  difficult situation due to pt's chronic constipation to give po iron  Family Communication: Sisters & extended family updated in conference  Disposition Plan: SNF at discharge.       Procedures/Studies: Dg Ankle 2 Views Left  08/13/2013   CLINICAL DATA:  Left ankle pain  EXAM: LEFT ANKLE - 2 VIEW  COMPARISON:  None  FINDINGS: Three views of the left ankle submitted. No acute fracture or subluxation. No radiopaque foreign body.  IMPRESSION: Negative.   Electronically Signed   By: Lahoma Crocker M.D.   On: 08/13/2013 16:09   Portable  Chest 1 View  09/03/2013   CLINICAL DATA:  Evaluate for infiltrates  EXAM: PORTABLE CHEST - 1 VIEW  COMPARISON:  09/13/2013  FINDINGS: Suboptimal study due to patient rotation. There is no gross change in the normal heart size and mild aortic tortuosity. There is no edema, consolidation, effusion, or pneumothorax.  IMPRESSION: No evidence of active cardiopulmonary disease.   Electronically Signed   By: Jorje Guild M.D.   On: 09/03/2013 01:05   Dg Chest Port 1 View  08/10/2013   CLINICAL DATA:  Dyspnea  EXAM: PORTABLE CHEST - 1 VIEW  COMPARISON:  08/09/2013  FINDINGS: A left-sided central venous line and nasogastric catheter are again noted in satisfactory position. The cardiac shadow remains enlarged. Patient is significantly rotated to the right accentuating the mediastinal markings. Bibasilar infiltrative changes are again seen slightly worse on the right than the left. No acute bony abnormality is noted.  IMPRESSION: Persistent bibasilar changes.   Electronically Signed   By: Inez Catalina M.D.   On: 08/10/2013 12:59   Dg Chest Port 1 View  08/09/2013   CLINICAL DATA:  Decreased O2 saturation.  EXAM: PORTABLE CHEST - 1 VIEW 01:03 a.m.  COMPARISON:  08/08/2013 and 08/07/2013  FINDINGS: The patient has developed and increased infiltrate at the right lung base with new atelectasis and an infiltrate in the left perihilar region as well as new small right pleural effusion. I suspect the findings represent pulmonary edema.  NG tube tip is below the diaphragm. Central venous catheter tip is at the cavoatrial junction.  IMPRESSION: Progressive infiltrate at the right base with new small right effusion.  New atelectasis in faint infiltrate in the left. I suspect the findings represent pulmonary edema.   Electronically Signed   By: [name] M.D.   On: 08/09/2013 01:26   Dg Abd Acute W/chest  08/26/2013   CLINICAL DATA:  Emesis.  Poorly eating.  EXAM: ACUTE ABDOMEN SERIES (ABDOMEN 2 VIEW & CHEST 1 VIEW)   COMPARISON:  Chest x-ray a 08/10/2013. Abdominal radiograph 08/08/2013.  FINDINGS: Lung volumes are low. Slight coarsening of interstitial markings, similar to prior examinations. No consolidative airspace disease. No pleural effusions. No pneumothorax. No pulmonary nodule or mass noted. Pulmonary vasculature and the cardiomediastinal silhouette are within normal limits.  Gas and stool are seen scattered throughout the colon extending to the level of the distal rectum. No pathologic distension of small bowel is noted. No gross evidence of pneumoperitoneum. Multiple nondilated gas-filled loops of small bowel throughout the central abdomen.  IMPRESSION: 1. Nonspecific, nonobstructive bowel gas pattern. 2. No pneumoperitoneum. 3. Low lung volumes without radiographic evidence of acute cardiopulmonary disease.   Electronically Signed   By: Vinnie Langton M.D.   On: 08/26/2013 18:58   Dg Abd Portable 1v  08/08/2013   CLINICAL DATA:  NG tube placement.  EXAM: PORTABLE ABDOMEN - 1 VIEW  COMPARISON:  None.  FINDINGS: An NG tube is identified with tip overlying the proximal-mid stomach.  The bowel gas pattern is unremarkable.  IMPRESSION: NG tube with tip overlying the proximal -mid stomach.   Electronically Signed   By: [name] M.D.   On: 08/08/2013 15:55   Dg Loyce Dys Tube Plc W/fl W/rad  09/04/2013   CLINICAL DATA:  Unable to pass nasogastric tube.  EXAM: NASO G TUBE PLACEMENT WITH FL AND WITH RAD  TECHNIQUE: Under direct fluoroscopic guidance the nasogastric tube was attempted to be passed into the stomach.  CONTRAST:  10 cc of thin barium  FLUOROSCOPY TIME:  5 min  COMPARISON:  None  FINDINGS: Initially the nasogastric tube was introduced through the left nostril and into the mid esophagus. The nasogastric tube was unable to be passed into the distal esophagus. A few cc of barium or injected into the nasogastric tube opacifying the mid and distal esophagus. Within the distal half of the esophagus there is a long  segment of marked luminal narrowing. This appears to account for the inability to pass nasogastric tube into the stomach.  IMPRESSION: 1. Apparent long segment of marked luminal narrowing involving the distal esophagus through which the nasogastric tube was unable to be passed. This may represent a benign or malignant stricture. Endoscopic evaluation is recommended.   Electronically Signed   By: Kerby Moors M.D.   On: 09/04/2013 16:21   Dg Ugi W/o Kub  09/03/2013   CLINICAL DATA:  Nausea, vomiting. History of gastric and duodenal ulcers.  EXAM: UPPER GI SERIES WITHOUT KUB  TECHNIQUE: Routine upper GI series was performed with high-density barium.  FLUOROSCOPY TIME:  1 min 47 seconds  COMPARISON:  08/26/2013 radiographs.  FINDINGS: The patient was either unable or would not drink contrast. The patient could not be positioned in the standard supine position. Despite the lateral positioning in the fetal position, I attempted to proceed with this study however the patient would not ingest contrast. In an attempt to stimulate swallowing, 2 mL of thick barium contrast was placed in the patient's mouth. Over the course of 1 min and 30 seconds, the patient did eventually swallow and this tiny bolus was observed to enter the esophagus. At this point, this study was deemed not feasible and terminated.  IMPRESSION: Aborted upper GI study.  The patient would not drink contrast.  I discussed the case with Dr. Fuller Plan. The patient had a recent endoscopy and the upper GI study is to ensure continued patency. The patient will require an enteric tube and then an upper GI and study could be performed through the enteric tube. Given the patient's positioning, the upper GI study even with a tube will be limited but should be adequate to assess for gross patency of the upper GI tract.   Electronically Signed   By: Dereck Ligas M.D.   On: 09/03/2013 15:04   Dg Esophagus Dilatation  09/05/2013   CLINICAL DATA: dysphagia    ESOPHAGEAL DILATATION  Fluoroscopy was provided for use by the requesting physician.  No images  were obtained for radiographic interpretation.    Dg Swallowing Func-speech Pathology  08/12/2013   Katherene Ponto Deblois, CCC-SLP     08/12/2013  2:59 PM Objective Swallowing Evaluation: Modified Barium Swallowing Study   Patient Details  Name: Steven [name] MRN: [id number] Date of Birth: Aug 31, 1955  Today's Date: 08/12/2013 Time: 1305-1340 SLP Time Calculation (min): 35 min  Past Medical History:  Past Medical History  Diagnosis Date  . Hyperlipidemia   . Anxiety   . Mental retardation     ue to temp of 108 aftrer having measles age 32.  . Hx of diaper rash   . Edema of foot 02/06/2012    BILATERAL   Past Surgical History:  Past Surgical History  Procedure Laterality Date  . Orif ankle fracture  02/06/2012  RIGHT ANKLE  . Orif ankle fracture  02/06/2012    Procedure: OPEN REDUCTION INTERNAL FIXATION (ORIF) ANKLE  FRACTURE;  Surgeon: Wylene Simmer, MD;  Location: [place];  Service:  Orthopedics;  Laterality: Right;  . Syndesmosis repair  02/06/2012    Procedure: SYNDESMOSIS REPAIR;  Surgeon: Wylene Simmer, MD;   Location: [place];  Service: Orthopedics;  Laterality: Right;  tri  maleolar  . Colonoscopy N/A 08/11/2013    Procedure: COLONOSCOPY;  Surgeon: Gatha Mayer, MD;   Location: [place];  Service: Endoscopy;  Laterality: N/A;  . Esophagogastroduodenoscopy N/A 08/11/2013    Procedure: ESOPHAGOGASTRODUODENOSCOPY (EGD);  Surgeon: Gatha Mayer, MD;  Location: [facility]COPY;  Service: Endoscopy;   Laterality: N/A;   HPI:  58 year old male with a history of mental retardation,  hyperlipidemia was taken to urgent care because of poor by mouth  intake. Apparently the patient has lost a significant amount of  weight over the last few weeks. The patient was transferred to  the ED and noted to be hypotensive and have leukocytosis with WBC  18.1.  He was seen by GI, determined to have a bleed and EGD  showed large ulcers,  suspected to be caused by NSAID.  The  patient was started on intravenous antibiotics for sepsis  secondary to healthcare associated pneumonia and possible  aspiration (right base infiltrate).      Assessment / Plan / Recommendation Clinical Impression  Dysphagia Diagnosis: Mild oral phase dysphagia;Mild pharyngeal  phase dysphagia Clinical impression: Pt presents with a mild oral dysphagia with  tongue pumping, anterior mastication due to missing posterior  dentition. Is able to manage soft solids well. Pt does have a  delayed swallow response, though with consecutive boluses pt is  able to maintain hyolaryngeal elevation and airway protection.  There was one instance of trace penetration above the cords with  mild pyriform sinuses residuals present post swallow with large  sips. At this time, aspiration risk appears quite low. Pt is  recommended to consume mechanical soft diet with thin liquids  with normal PO intake habits (self feeding though somewhat  impulsive). If family notices possible signs of aspiration,  recommend full supervision with POs for small single sips,  restricting straws. No SLP f/u needed at this time.     Treatment Recommendation  No treatment recommended at this time    Diet Recommendation Dysphagia 3 (Mechanical Soft);Thin liquid   Liquid Administration via: Cup;Straw Medication Administration: Whole meds with puree Supervision: Staff to assist with self feeding Compensations: Slow rate;Small sips/bites Postural Changes and/or Swallow Maneuvers: Seated upright 90  degrees    Other  Recommendations Oral Care Recommendations: Oral care BID   Follow Up Recommendations  Other (comment) (group home)    Frequency and Duration        Pertinent Vitals/Pain NA    SLP Swallow Goals     General HPI: 57 year old male with a history of mental  retardation, hyperlipidemia was taken to urgent care because of  poor by mouth intake. Apparently the patient has lost a  significant amount of weight over the  last few weeks. The patient  was transferred to the ED and noted to be hypotensive and have  leukocytosis with WBC 18.1.  He was seen by GI, determined to  have a bleed and EGD showed large ulcers, suspected to be caused  by NSAID.  The patient was started on intravenous antibiotics for  sepsis secondary to healthcare associated pneumonia and possible  aspiration (right base infiltrate).  Type of Study: Modified Barium Swallowing Study Reason for Referral: Objectively evaluate swallowing function Previous Swallow Assessment: none Diet Prior to this Study: Other (Comment) (SOFT) Temperature Spikes Noted: No Respiratory Status: Room air History of Recent Intubation: No Behavior/Cognition: Alert;Cooperative;Pleasant mood;Doesn't  follow directions Oral Cavity - Dentition: Missing dentition Oral Motor / Sensory Function: Within functional limits Self-Feeding Abilities: Able to feed self;Needs assist Patient Positioning: Upright in chair Baseline Vocal Quality: Clear Volitional Cough: Cognitively unable to elicit Volitional Swallow: Unable to elicit Anatomy: Within functional limits Pharyngeal Secretions: Not observed secondary MBS    Reason for Referral Objectively evaluate swallowing function   Oral Phase Oral Preparation/Oral Phase Oral Phase: Impaired Oral - Thin Oral - Thin Cup: Lingual pumping Oral - Thin Straw: Lingual pumping Oral - Solids Oral - Puree: Lingual pumping Oral - Mechanical Soft: Impaired mastication (only anterior  dentition)   Pharyngeal Phase Pharyngeal Phase Pharyngeal Phase: Impaired Pharyngeal - Thin Pharyngeal - Thin Cup: Delayed swallow initiation;Pharyngeal  residue - pyriform sinuses Pharyngeal - Thin Straw: Delayed swallow initiation;Pharyngeal  residue - pyriform sinuses;Penetration/Aspiration after swallow Penetration/Aspiration details (thin straw): Material enters  airway, remains ABOVE vocal cords then ejected out Pharyngeal - Solids Pharyngeal - Puree: Delayed swallow initiation  Pharyngeal - Mechanical Soft: Delayed swallow initiation  Cervical Esophageal Phase    GO    Cervical Esophageal Phase Cervical Esophageal Phase: WFL (appearance of possible hiatal  hernia, no radiologist present)        Herbie Baltimore, MA CCC-SLP [phone]  DeBlois, Katherene Ponto 08/12/2013, 2:57 PM          Subjective: Patient is unable to communicate his needs to 2 mental retardation. There are reports of respiratory distress, vomiting, uncontrolled pain, diarrhea.  Objective: Filed Vitals:   09/06/13 0551 09/06/13 1325 09/06/13 2124 09/07/13 0629  BP: 110/68 133/57 130/60 133/85  Pulse: 77 75 69 57  Temp: 98.6 F (37 C) 98.4 F (36.9 C) 99.3 F (37.4 C) 98.6 F (37 C)  TempSrc: Oral Oral Oral Oral  Resp: $Remo'16 16 16 16  'lZOKm$ Height:      Weight: 46.131 kg (101 lb 11.2 oz)   57.3 kg (126 lb 5.2 oz)  SpO2: 98% 96% 96% 98%    Intake/Output Summary (Last 24 hours) at 09/07/13 0740 Last data filed at 09/07/13 0630  Gross per 24 hour  Intake 2706.67 ml  Output    300 ml  Net 2406.67 ml   Weight change: 11.169 kg (24 lb 10 oz) Exam:   General:  Pt is alert, follows commands appropriately, not in acute distress  HEENT: No icterus, No thrush, [place]/AT  Cardiovascular: RRR, S1/S2, no rubs, no gallops  Respiratory: Poor inspiratory effort but clear to auscultation. No wheezing.  Abdomen: Soft/+BS, epigastric tenderness to palpation., non distended, no guarding  Extremities: No edema, No lymphangitis, No petechiae, No rashes, no synovitis  Data Reviewed: Basic Metabolic Panel:  Recent Labs Lab 09/02/13 2143 09/03/13 0605 09/04/13 0756 09/05/13 0540 09/06/13 0525  NA 148* 149* 145 143 135*  K 3.6* 3.3* 3.8 3.8 3.8  CL 108 110 110 108 102  CO2 $Re'22 24 22 25 23  'TVb$ GLUCOSE 85 83 131* 148* 118*  BUN 13 11 5* 3* 3*  CREATININE 0.44* 0.43* 0.38* 0.34* 0.31*  CALCIUM 9.9 9.4 9.0 9.0 8.7   Liver Function Tests:  Recent Labs Lab 09/02/13 2143 09/03/13 0605  AST 14 11  ALT  10 11  ALKPHOS 82 82  BILITOT  0.4 0.4  PROT 6.3 6.0  ALBUMIN 2.6* 2.6*   No results found for this basename: LIPASE, AMYLASE,  in the last 168 hours No results found for this basename: AMMONIA,  in the last 168 hours CBC:  Recent Labs Lab 09/02/13 2143 09/03/13 0605 09/04/13 0756  WBC 6.7 6.1 9.2  HGB 12.7* 13.0 12.9*  HCT 41.8 42.3 40.8  MCV 89.9 91.4 90.7  PLT 383 436* 335   Cardiac Enzymes:  Recent Labs Lab 09/04/13 0756  CKTOTAL 49   BNP: No components found with this basename: POCBNP,  CBG: No results found for this basename: GLUCAP,  in the last 168 hours  Recent Results (from the past 240 hour(s))  URINE CULTURE     Status: None   Collection Time    09/03/13  6:16 AM      Result Value Ref Range Status   Specimen Description URINE, CATHETERIZED   Final   Special Requests NONE   Final   Culture  Setup Time     Final   Value: 09/03/2013 14:28     Performed at [place]     Final   Value: >=100,000 COLONIES/ML     Performed at [facility]   Culture     Final   Value: Multiple bacterial morphotypes present, none predominant. Suggest appropriate recollection if clinically indicated.     Performed at [facility]   Report Status 09/04/2013 FINAL   Final  CLOSTRIDIUM DIFFICILE BY PCR     Status: None   Collection Time    09/05/13  5:18 PM      Result Value Ref Range Status   C difficile by pcr NEGATIVE  NEGATIVE Final     Scheduled Meds: . antiseptic oral rinse  7 mL Mouth Rinse BID  . atorvastatin  10 mg Oral Daily  . chlorhexidine  10 mL Mouth/Throat TID  . feeding supplement (RESOURCE BREEZE)  1 Container Oral TID BM  . magic mouthwash w/lidocaine  10 mL Oral QID  . pantoprazole (PROTONIX) IV  40 mg Intravenous Q12H  . polyethylene glycol  17 g Oral BID   Continuous Infusions: . sodium chloride 500 mL (09/05/13 1002)  . sodium chloride 50 mL/hr at 09/06/13 2045     Lianny Molter, DO  [facility] Pager [phone]  If 7PM-7AM, please contact night-coverage [email or web address] Password [email or web address] 09/07/2013, 7:40 AM   LOS: 5 days

## 2013-09-07 NOTE — Progress Notes (Signed)
     [facility] Progress Note  Subjective:  Is eating small amounts of full liquids, not much though per his nurse.  His sisters were not present today at the time of my visit.  Objective:  Vital signs in last 24 hours: Temp:  [98.4 F (36.9 C)-99.3 F (37.4 C)] 98.6 F (37 C) (08/25 0629) Pulse Rate:  [57-75] 57 (08/25 0629) Resp:  [16] 16 (08/25 0629) BP: (130-133)/(57-85) 133/85 mmHg (08/25 0629) SpO2:  [96 %-98 %] 98 % (08/25 0629) Weight:  [126 lb 5.2 oz (57.3 kg)] 126 lb 5.2 oz (57.3 kg) (08/25 0629) Last BM Date: 09/06/13 General:  Alert, in NAD Heart:  Regular rate and rhythm Pulm:  CTAB. Abdomen:  Soft, non-distended. Normal bowel sounds present.  He does not express any tenderness.  Difficult to examine due to contractures. Extremities:  Without edema.  Intake/Output from previous day: 08/24 0701 - 08/25 0700 In: 2706.7 [P.O.:270; I.V.:2436.7] Out: 300 [Urine:300]  Lab Results:  BMET  Recent Labs  09/05/13 0540 09/06/13 0525  NA 143 135*  K 3.8 3.8  CL 108 102  CO2 25 23  GLUCOSE 148* 118*  BUN 3* 3*  CREATININE 0.34* 0.31*  CALCIUM 9.0 8.7   Dg Esophagus Dilatation  09/05/2013   CLINICAL DATA: dysphagia   ESOPHAGEAL DILATATION  Fluoroscopy was provided for use by the requesting physician.  No images  were obtained for radiographic interpretation.     Assessment / Plan: *58 y.o. male with severe contractures, distal esopahgeal stricture (likely from GERD damage following PUD noted last month).   Need to continue twice daily PPI, changed to oral today since he is taking PO again. Need to continue strict reflux precautions.  Will plan to repeat EGD with dilation on Wednesday (1:00 pm in endoscopy).  He is OK to d/c from GI perspective after that procedure.    LOS: 5 days   ZEHR, JESSICA D.  09/07/2013, 8:59 AM  Pager number [phone]   ________________________________________________________________________  Velora Heckler GI MD  note:  I personally examined the patient, reviewed the data and agree with the assessment and plan described above.  Repeat EGD with dilation tomorrow, probably home afterwards.   Owens Loffler, MD [facility] Pager [phone]

## 2013-09-08 ENCOUNTER — Inpatient Hospital Stay (HOSPITAL_COMMUNITY): Payer: Medicare Other | Admitting: Anesthesiology

## 2013-09-08 ENCOUNTER — Inpatient Hospital Stay (HOSPITAL_COMMUNITY): Payer: Medicare Other

## 2013-09-08 ENCOUNTER — Encounter (HOSPITAL_COMMUNITY): Payer: Medicare Other | Admitting: Anesthesiology

## 2013-09-08 ENCOUNTER — Encounter (HOSPITAL_COMMUNITY): Payer: Self-pay | Admitting: Anesthesiology

## 2013-09-08 ENCOUNTER — Encounter (HOSPITAL_COMMUNITY)
Admission: AD | Disposition: A | Discharge status: Skilled Nursing Facility | Payer: Self-pay | Source: Ambulatory Visit | Attending: Internal Medicine

## 2013-09-08 DIAGNOSIS — I519 Heart disease, unspecified: Secondary | ICD-10-CM

## 2013-09-08 HISTORY — PX: ESOPHAGOGASTRODUODENOSCOPY (EGD) WITH PROPOFOL: SHX5813

## 2013-09-08 HISTORY — PX: RADIOLOGY WITH ANESTHESIA: SHX6223

## 2013-09-08 SURGERY — ESOPHAGOGASTRODUODENOSCOPY (EGD) WITH PROPOFOL
Anesthesia: Monitor Anesthesia Care

## 2013-09-08 SURGERY — RADIOLOGY WITH ANESTHESIA
Anesthesia: General

## 2013-09-08 MED ORDER — ENSURE COMPLETE PO LIQD
237.0000 mL | Freq: Two times a day (BID) | ORAL | Status: DC
  Administered 2013-09-08 – 2013-09-09 (×2): 237 mL via ORAL

## 2013-09-08 MED ORDER — ONDANSETRON HCL 4 MG/2ML IJ SOLN
INTRAMUSCULAR | Status: DC | PRN
  Administered 2013-09-08: 4 mg via INTRAVENOUS

## 2013-09-08 MED ORDER — HYDROMORPHONE HCL PF 1 MG/ML IJ SOLN: 0.2500 mg | INTRAMUSCULAR | Status: DC | PRN

## 2013-09-08 MED ORDER — BACLOFEN 5 MG HALF TABLET
5.0000 mg | ORAL_TABLET | Freq: Three times a day (TID) | ORAL | Status: DC
  Administered 2013-09-08 – 2013-09-13 (×14): 5 mg via ORAL
  Filled 2013-09-08 (×20): qty 1

## 2013-09-08 MED ORDER — LACTATED RINGERS IV SOLN
INTRAVENOUS | Status: DC
  Administered 2013-09-08: 1000 mL via INTRAVENOUS

## 2013-09-08 MED ORDER — PROPOFOL 10 MG/ML IV BOLUS
INTRAVENOUS | Status: DC | PRN
  Administered 2013-09-08 (×2): 20 mg via INTRAVENOUS

## 2013-09-08 MED ORDER — CETAPHIL MOISTURIZING EX LOTN
TOPICAL_LOTION | Freq: Every day | CUTANEOUS | Status: DC
  Administered 2013-09-08 – 2013-09-12 (×5): via TOPICAL
  Administered 2013-09-13: 1 via TOPICAL
  Filled 2013-09-08: qty 473

## 2013-09-08 MED ORDER — MIDAZOLAM HCL 5 MG/5ML IJ SOLN
INTRAMUSCULAR | Status: DC | PRN
  Administered 2013-09-08: 2 mg via INTRAVENOUS

## 2013-09-08 MED ORDER — BACLOFEN 5 MG HALF TABLET: 5.0000 mg | ORAL_TABLET | Freq: Three times a day (TID) | ORAL | Status: DC

## 2013-09-08 MED ORDER — LACTATED RINGERS IV SOLN
INTRAVENOUS | Status: DC | PRN
  Administered 2013-09-08: 08:00:00 via INTRAVENOUS

## 2013-09-08 MED ORDER — WHITE PETROLATUM GEL
Status: AC
  Administered 2013-09-08: 0.2
  Filled 2013-09-08: qty 5

## 2013-09-08 MED ORDER — FENTANYL CITRATE 0.05 MG/ML IJ SOLN: 25.0000 ug | INTRAMUSCULAR | Status: DC | PRN

## 2013-09-08 MED ORDER — LIDOCAINE HCL (CARDIAC) 20 MG/ML IV SOLN
INTRAVENOUS | Status: DC | PRN
  Administered 2013-09-08: 40 mg via INTRAVENOUS

## 2013-09-08 NOTE — Anesthesia Postprocedure Evaluation (Signed)
  Anesthesia Post-op Note  Patient: Steven [name]  Procedure(s) Performed: Procedure(s) with comments: ESOPHAGOGASTRODUODENOSCOPY (EGD) WITH PROPOFOL (N/A) - patient wil have MRI after EGD / Vicky( OR) ebp 1400-1500  Patient Location: Endoscopy Unit  Anesthesia Type:MAC  Level of Consciousness: awake  Airway and Oxygen Therapy: Patient Spontanous Breathing and Patient connected to nasal cannula oxygen  Post-op Pain: none  Post-op Assessment: Post-op Vital signs reviewed, Patient's Cardiovascular Status Stable, Respiratory Function Stable, Patent Airway and No signs of Nausea or vomiting  Post-op Vital Signs: Reviewed and stable  Last Vitals:  Filed Vitals:   09/08/13 0846  BP: 134/79  Pulse: 72  Temp: 36.9 C  Resp: 13    Complications: No apparent anesthesia complications

## 2013-09-08 NOTE — Anesthesia Preprocedure Evaluation (Addendum)
Anesthesia Evaluation  Patient identified by MRN, date of birth, ID band Patient awake and Patient confused    Reviewed: Allergy & Precautions, H&P , NPO status , Patient's Chart, lab work & pertinent test results, Unable to perform ROS - Chart review only  Airway Mallampati: III TM Distance: >3 FB Neck ROM: limited    Dental  (+) Poor Dentition, Dental Advidsory Given   Pulmonary pneumonia -, resolved, former smoker,  breath sounds clear to auscultation        Cardiovascular + Peripheral Vascular Disease Rhythm:regular     Neuro/Psych PSYCHIATRIC DISORDERS    GI/Hepatic PUD,   Endo/Other    Renal/GU      Musculoskeletal   Abdominal   Peds  Hematology  (+) Blood dyscrasia, anemia ,   Anesthesia Other Findings   Reproductive/Obstetrics                         Anesthesia Physical Anesthesia Plan  ASA: III  Anesthesia Plan: MAC   Post-op Pain Management:    Induction: Intravenous  Airway Management Planned:   Additional Equipment:   Intra-op Plan:   Post-operative Plan:   Informed Consent:   Dental Advisory Given  Plan Discussed with: Anesthesiologist, CRNA and Surgeon  Anesthesia Plan Comments:        Anesthesia Quick Evaluation

## 2013-09-08 NOTE — Anesthesia Postprocedure Evaluation (Signed)
  Anesthesia Post-op Note  Patient: Steven [name]  Procedure(s) Performed: Procedure(s) with comments: ESOPHAGOGASTRODUODENOSCOPY (EGD) WITH PROPOFOL (N/A) - patient wil have MRI after EGD / Vicky( OR) ebp 1400-1500  Patient Location: PACU  Anesthesia Type:General  Level of Consciousness: awake  Airway and Oxygen Therapy: Patient Spontanous Breathing  Post-op Pain: mild  Post-op Assessment: Post-op Vital signs reviewed  Post-op Vital Signs: Reviewed  Last Vitals:  Filed Vitals:   09/08/13 0901  BP: 120/79  Pulse: 28  Temp:   Resp: 0    Complications: No apparent anesthesia complications

## 2013-09-08 NOTE — Interval H&P Note (Signed)
History and Physical Interval Note:  09/08/2013 8:03 AM  Steven [name]  has presented today for surgery, with the diagnosis of dsyphagia, esophageal stricture, GERD  The various methods of treatment have been discussed with the patient and family. After consideration of risks, benefits and other options for treatment, the patient has consented to  Procedure(s) with comments: ESOPHAGOGASTRODUODENOSCOPY (EGD) WITH PROPOFOL (N/A) - patient wil have MRI after EGD / Vicky( OR) ebp 1400-1500 as a surgical intervention .  The patient's history has been reviewed, patient examined, no change in status, stable for surgery.  I have reviewed the patient's chart and labs.  Questions were answered to the patient's satisfaction.     Milus Banister

## 2013-09-08 NOTE — Op Note (Signed)
[facility] [place], [address]   ENDOSCOPY PROCEDURE REPORT  PATIENT: Steven [name], Steven [name]  MR#: [id number] BIRTHDATE: 1955/08/29 , 44  yrs. old GENDER: Male ENDOSCOPIST: Milus Banister, MD PROCEDURE DATE:  09/08/2013 PROCEDURE:  EGD, dilation with balloon ASA CLASS:     Class III INDICATIONS:  severe esophagitis, dysphagia; EGD 4 days ago Dr. Fuller Plan with dilation to 23mm with savory. MEDICATIONS: MAC sedation, administered by CRNA TOPICAL ANESTHETIC: none  DESCRIPTION OF PROCEDURE: After the risks benefits and alternatives of the procedure were thoroughly explained, informed consent was obtained.  The PENTAX GASTOROSCOPE [id number] endoscope was introduced through the mouth and advanced to the stomach body. Without limitations.  The instrument was slowly withdrawn as the mucosa was fully examined.    There was severe, ulcerative, friable esophagitis from GE junction to mid esophagus.  The circumferential ulcer appears to be healing. The lumen was stricture to about 21mm for most distal 4-5cm of the esophagus.  This was dilated sequentially with CRE TTS balloon from 51mm to 36mm with good results.  I was able to advance the scope into the stomach following dilation, noting a large hiatal hernia. The examination was otherwise normal.  Retroflexed views revealed no abnormalities.     The scope was then withdrawn from the patient and the procedure completed. COMPLICATIONS: There were no complications. ENDOSCOPIC IMPRESSION: There was severe, ulcerative, friable esophagitis from GE junction to mid esophagus.  The circumferential ulcer appears to be healing. The lumen was stricture to about 51mm for most distal 4-5cm of the esophagus.  This was dilated sequentially with CRE TTS balloon from 41mm to 54mm with good results.  I was able to advance the scope into the stomach following dilation, noting a large hiatal hernia. The examination was  otherwise normal.  RECOMMENDATIONS: Need to continue twice daily PPI (20-30 min prior to BF and dinner meals).  Needs strict reflux precautions (try to keep him upright at least 45 degrees unless he is sleeping and for at least 1 hour following any meals).  Our office will contact you about return appt in 5-6 weeks to check on your swallowing, consider repeat EGD with dilation if needed.   eSigned:  Milus Banister, MD 09/08/2013 8:50 AM   CC: Lucio Edward, MD

## 2013-09-08 NOTE — Transfer of Care (Signed)
Immediate Anesthesia Transfer of Care Note  Patient: Steven [name]  Procedure(s) Performed: Procedure(s): MRI-RADIOLOGY WITH ANESTHESIA (N/A)  Patient Location: PACU  Anesthesia Type:General  Level of Consciousness: awake and alert   Airway & Oxygen Therapy: Patient Spontanous Breathing and Patient connected to nasal cannula oxygen  Post-op Assessment: Report given to PACU RN  Post vital signs: Reviewed and stable  Complications: No apparent anesthesia complications

## 2013-09-08 NOTE — Progress Notes (Addendum)
Active be search is in place for SNF level bed.  CSW spoke with pateint's sister [name] this evening- she has spoken to Admissions at [facility]. This is their first preference for placement. Discussed additional bed search and she wants search in [facility] as well.  Fl2 sent to area facilities for review.  Patient will require a Level 2 PASARR screen due to diagnosis of Mental Retardation- manifested at the age of 60.  Patient has required institutionalization for most of his life.  PASARR screening request has been initiated and awaiting PASARR Nurse reviewer to come assess patient.  Cannot place patient until Level 2 PASARR has been assigned. Patient is currently on a full liquid diet- MD- can this be advanced?  CSW will contact Admissions at [facility] to request review of referral for possible d/c.  Thanks!  Lorie Phenix. Pauline Good, [place]

## 2013-09-08 NOTE — Clinical Social Work Placement (Signed)
     Clinical Social Work Department CLINICAL SOCIAL WORK PLACEMENT NOTE 09/08/2013  Patient:  Steven [name], Steven [name]  Account Number:  [id number] Admit date:  09/02/2013  Clinical Social Worker:  Keviana Guida, LCSWA  Date/time:     Clinical Social Work is seeking post-discharge placement for this patient at the following level of care:   SKILLED NURSING   (*CSW will update this form in Epic as items are completed)     Patient/family provided with [facility] Department of Clinical Social Works list of facilities offering this level of care within the geographic area requested by the patient (or if unable, by the patients family).  09/08/2013  Patient/family informed of their freedom to choose among providers that offer the needed level of care, that participate in Medicare, Medicaid or managed care program needed by the patient, have an available bed and are willing to accept the patient.    Patient/family informed of [facility] ownership interest in [facility], as well as of the fact that they are under no obligation to receive care at this facility.  PASARR submitted to EDS on 09/08/2013 PASARR number received on   FL2 transmitted to all facilities in geographic area requested by pt/family on  09/08/2013 FL2 transmitted to all facilities within larger geographic area on   Patient informed that his/her managed care company has contracts with or will negotiate with  certain facilities, including the following:   NA  Faxed to [employer] and to [employer] informed of bed offers received:   Patient chooses bed at  Physician recommends and patient chooses bed at    Patient to be transferred to  on   Patient to be transferred to facility by [facility]  (PTAR) Patient and family notified of transfer on  Name of family member notified:    The following physician request were entered in Epic: Physician Request  Please prepare priority  discharge summary and prescriptions.  Please sign FL2.    Additional Comments:

## 2013-09-08 NOTE — Transfer of Care (Signed)
Immediate Anesthesia Transfer of Care Note  Patient: Steven [name]  Procedure(s) Performed: Procedure(s) with comments: ESOPHAGOGASTRODUODENOSCOPY (EGD) WITH PROPOFOL (N/A) - patient wil have MRI after EGD / Vicky( OR) ebp 1400-1500  Patient Location: Endoscopy Unit  Anesthesia Type:MAC  Level of Consciousness: awake and alert   Airway & Oxygen Therapy: Patient Spontanous Breathing and Patient connected to nasal cannula oxygen  Post-op Assessment: Report given to PACU RN  Post vital signs: Reviewed and stable  Complications: No apparent anesthesia complications

## 2013-09-08 NOTE — Anesthesia Postprocedure Evaluation (Signed)
  Anesthesia Post-op Note  Patient: Steven [name]  Procedure(s) Performed: Procedure(s): MRI-RADIOLOGY WITH ANESTHESIA (N/A)  Patient Location: PACU  Anesthesia Type:General  Level of Consciousness: awake, alert  and oriented  Airway and Oxygen Therapy: Patient Spontanous Breathing and Patient connected to face mask oxygen  Post-op Pain: none  Post-op Assessment: Post-op Vital signs reviewed, Patient's Cardiovascular Status Stable, Respiratory Function Stable, Patent Airway and No signs of Nausea or vomiting  Post-op Vital Signs: Reviewed and stable  Last Vitals:  Filed Vitals:   09/08/13 1214  BP: 107/67  Pulse: 75  Temp: 36.7 C  Resp: 20    Complications: No apparent anesthesia complications

## 2013-09-08 NOTE — Progress Notes (Signed)
PROGRESS NOTE  Steven [name] GXQ:[id number] DOB: [birth year] DOA: 09/02/2013 PCP: [name], MD  Interim summary 58 year old male with a history of mental retardation, hyperlipidemia was taken to urgent care because of poor by mouth intake. Apparently the patient has lost a significant amount of weight over the last few weeks. The patient was discharged from [facility] on 08/14/2013 after an admission for sepsis and acute blood loss anemia secondary to NSAID-induced gastric and duodenal ulcerations. His colonoscopy was normal. He was discharged to the group home. He was readmitted due to 2 failure to thrive, dysphasia, and decreased oral intake. The patient was fluid resuscitated for his dehydration. 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 He underwent dilatation at that time.. The patient was seen by ENT was performed upper airway laryngoscopy. This was negative. Because of the patient's progressive dysphagia and reported lower extremity weakness, MRI of the brain has been planned. Because of the patient's contractures and agitation, this has been coordinated to be performed under conscious sedation after his endoscopy on 09/08/2013. The patient is to have repeat EGD with esophageal dilatation on 09/08/2013.  Assessment/Plan: dehydration with hypernatremia  Due to little or no PO intake.  Resolved with IV hydration Now able to tolerate liquids s/p dilation  Esphagitis/Esophageal Stricture with dysphagia contributing to pt's dysphagia  09/05/2013 EGD--severe esophageal stricture of lower third of the esophagus, grade D esophagitis, nonbleeding ulcers in the prepyloric stand duodenal bulb  after initial dilatation-->pt took clear liquids and swallowed  repeat dilatation 09/08/13 up to 15 mm. Repeat clinical swallow eval.-->dysphagia 3  ENT--Dr. [name] performed laryngoscopy (8/22) which was negative for any abscess or obstruction    Dr. [name] also attempted to place NG tube laryngoscope, but met resistance  Family wants a PEG if we are unable to restore his ability to swallow.  On BID PPI IV. Bx negative for H pylori.  NSAIDS stopped.  Patient tolerating clears will advance to fulls with dinner (8/26)  Severe Malnutrition/failure to thrive.  Patient has lost 32% of his body weight in 5 weeks!  Appreciate nutrition consultation  family still desires gastrostomy tube if pt is unable to sustain his nutritional requirements. TSH 2.380  Serum B12--1109  A.m. 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 Dr [name] spoke patient's sister's at bedside-patient has not been ambulatory since around thanksgiving of last year.Since then-he has had right ankle fracture that required surgery. Patient has had progressive problems with ambulation since then. PT for ROM  Received MRI brain under propofol.  Negative for acute etiology of contractures. Start low dose baclofen. Family desires [facility] SNF at discharge.   Diarrhea  c-diff negative.  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  Patient currently dehydrated to euvolemic - not volume overloaded.   Iron deficiency  Given a dose of IV Nulecit  difficult situation due to pt's chronic constipation to give po iron   Family Communication: Sisters & extended family updated at bedside. Disposition Plan: SNF at discharge.  Family prefers [facility] if possible.   Procedures/Studies:  Portable Chest 1 View  09-09-13   CLINICAL DATA:  Evaluate for infiltrates  EXAM: PORTABLE CHEST - 1 VIEW  COMPARISON:  09/13/2013  FINDINGS: Suboptimal study due to patient rotation. There is no gross change in the normal heart size and mild aortic tortuosity. There is  no edema, consolidation, effusion, or pneumothorax.  IMPRESSION: No evidence of active cardiopulmonary disease.   Electronically Signed   By: Jorje Guild M.D.   On: 09/03/2013 01:05    Dg Abd Acute W/chest  08/26/2013   CLINICAL DATA:  Emesis.  Poorly eating.  EXAM: ACUTE ABDOMEN SERIES (ABDOMEN 2 VIEW & CHEST 1 VIEW)  COMPARISON:  Chest x-ray a 08/10/2013. Abdominal radiograph 08/08/2013.  FINDINGS: Lung volumes are low. Slight coarsening of interstitial markings, similar to prior examinations. No consolidative airspace disease. No pleural effusions. No pneumothorax. No pulmonary nodule or mass noted. Pulmonary vasculature and the cardiomediastinal silhouette are within normal limits.  Gas and stool are seen scattered throughout the colon extending to the level of the distal rectum. No pathologic distension of small bowel is noted. No gross evidence of pneumoperitoneum. Multiple nondilated gas-filled loops of small bowel throughout the central abdomen.  IMPRESSION: 1. Nonspecific, nonobstructive bowel gas pattern. 2. No pneumoperitoneum. 3. Low lung volumes without radiographic evidence of acute cardiopulmonary disease.   Electronically Signed   By: Vinnie Langton M.D.   On: 08/26/2013 18:58      Dg Loyce Dys Tube Plc W/fl W/rad  09/04/2013   CLINICAL DATA:  Unable to pass nasogastric tube.  EXAM: NASO G TUBE PLACEMENT WITH FL AND WITH RAD  TECHNIQUE: Under direct fluoroscopic guidance the nasogastric tube was attempted to be passed into the stomach.  CONTRAST:  10 cc of thin barium  FLUOROSCOPY TIME:  5 min  COMPARISON:  None  FINDINGS: Initially the nasogastric tube was introduced through the left nostril and into the mid esophagus. The nasogastric tube was unable to be passed into the distal esophagus. A few cc of barium or injected into the nasogastric tube opacifying the mid and distal esophagus. Within the distal half of the esophagus there is a long segment of marked luminal narrowing. This appears to account for the inability to pass nasogastric tube into the stomach.  IMPRESSION: 1. Apparent long segment of marked luminal narrowing involving  the distal esophagus through which the nasogastric tube was unable to be passed. This may represent a benign or malignant stricture. Endoscopic evaluation is recommended.   Electronically Signed   By: Kerby Moors M.D.   On: 09/04/2013 16:21   Dg Ugi W/o Kub  09/03/2013   CLINICAL DATA:  Nausea, vomiting. History of gastric and duodenal ulcers.  EXAM: UPPER GI SERIES WITHOUT KUB  TECHNIQUE: Routine upper GI series was performed with high-density barium.  FLUOROSCOPY TIME:  1 min 47 seconds  COMPARISON:  08/26/2013 radiographs.  FINDINGS: The patient was either unable or would not drink contrast. The patient could not be positioned in the standard supine position. Despite the lateral positioning in the fetal position, I attempted to proceed with this study however the patient would not ingest contrast. In an attempt to stimulate swallowing, 2 mL of thick barium contrast was placed in the patient's mouth. Over the course of 1 min and 30 seconds, the patient did eventually swallow and this tiny bolus was observed to enter the esophagus. At this point, this study was deemed not feasible and terminated.  IMPRESSION: Aborted upper GI study.  The patient would not drink contrast.  I discussed the case with Dr. Fuller Plan. The patient had a recent endoscopy and the upper GI study is to ensure continued patency. The patient will require an enteric tube and then an upper GI and study could be performed through the enteric tube. Given the  patient's positioning, the upper GI study even with a tube will be limited but should be adequate to assess for gross patency of the upper GI tract.   Electronically Signed   By: Dereck Ligas M.D.   On: 09/03/2013 15:04   Dg Esophagus Dilatation  09/05/2013   CLINICAL DATA: dysphagia   ESOPHAGEAL DILATATION  Fluoroscopy was provided for use by the requesting physician.  No images  were obtained for radiographic interpretation.        Subjective: Per family he is calm and wanting  to eat.  Patient has severe MR and is unable to communicate his needs.  Objective: Filed Vitals:   09/08/13 0915 09/08/13 1142 09/08/13 1145 09/08/13 1214  BP: 148/117 138/89  107/67  Pulse: 24 86 82 75  Temp:  97.9 F (36.6 C) 99 F (37.2 C) 98 F (36.7 C)  TempSrc:    Axillary  Resp: $Remo'19 16 23 20  'JrwpG$ Height:      Weight:      SpO2: 97% 100% 100% 98%    Intake/Output Summary (Last 24 hours) at 09/08/13 1335 Last data filed at 09/08/13 5643  Gross per 24 hour  Intake 2189.17 ml  Output    700 ml  Net 1489.17 ml   Weight change: 2.1 kg (4 lb 10.1 oz) Exam:   General:  Pt is alert, NAD,   HEENT: No icterus, No thrush, /AT  Cardiovascular: RRR, S1/S2, no rubs, no gallops  Respiratory: Poor inspiratory effort but clear to auscultation. No wheezing.  Abdomen: Soft/+BS, epigastric tenderness to palpation., non distended, no guarding  Extremities: No edema,  No petechiae, No rashes, Arms and legs are contracted.  Skin:  Dry  Data Reviewed: Basic Metabolic Panel:  Recent Labs Lab 09/02/13 2143 09/03/13 0605 09/04/13 0756 09/05/13 0540 09/06/13 0525  NA 148* 149* 145 143 135*  K 3.6* 3.3* 3.8 3.8 3.8  CL 108 110 110 108 102  CO2 $Re'22 24 22 25 23  'JAK$ GLUCOSE 85 83 131* 148* 118*  BUN 13 11 5* 3* 3*  CREATININE 0.44* 0.43* 0.38* 0.34* 0.31*  CALCIUM 9.9 9.4 9.0 9.0 8.7   Liver Function Tests:  Recent Labs Lab 09/02/13 2143 09/03/13 0605  AST 14 11  ALT 10 11  ALKPHOS 82 82  BILITOT 0.4 0.4  PROT 6.3 6.0  ALBUMIN 2.6* 2.6*   CBC:  Recent Labs Lab 09/02/13 2143 09/03/13 0605 09/04/13 0756  WBC 6.7 6.1 9.2  HGB 12.7* 13.0 12.9*  HCT 41.8 42.3 40.8  MCV 89.9 91.4 90.7  PLT 383 436* 335   Cardiac Enzymes:  Recent Labs Lab 09/04/13 0756  CKTOTAL 49    Recent Results (from the past 240 hour(s))  URINE CULTURE     Status: None   Collection Time    09/03/13  6:16 AM      Result Value Ref Range Status   Specimen Description URINE, CATHETERIZED    Final   Special Requests NONE   Final   Culture  Setup Time     Final   Value: 09/03/2013 14:28     Performed at [facility]     Final   Value: >=100,000 COLONIES/ML     Performed at [facility]   Culture     Final   Value: Multiple bacterial morphotypes present, none predominant. Suggest appropriate recollection if clinically indicated.     Performed at [facility]   Report Status 09/04/2013 FINAL  Final  STOOL CULTURE     Status: None   Collection Time    09/05/13  5:18 PM      Result Value Ref Range Status   Specimen Description STOOL   Final   Special Requests NONE   Final   Culture     Final   Value: NO SUSPICIOUS COLONIES, CONTINUING TO HOLD     Performed at [facility]   Report Status PENDING   Incomplete  CLOSTRIDIUM DIFFICILE BY PCR     Status: None   Collection Time    09/05/13  5:18 PM      Result Value Ref Range Status   C difficile by pcr NEGATIVE  NEGATIVE Final     Scheduled Meds: . antiseptic oral rinse  7 mL Mouth Rinse BID  . atorvastatin  10 mg Oral Daily  . baclofen  5 mg Oral TID  . cetaphil   Topical Daily  . chlorhexidine  10 mL Mouth/Throat TID  . enoxaparin (LOVENOX) injection  40 mg Subcutaneous Q24H  . feeding supplement (RESOURCE BREEZE)  1 Container Oral TID BM  . magic mouthwash w/lidocaine  10 mL Oral QID  . pantoprazole  40 mg Oral BID  . polyethylene glycol  17 g Oral BID   Continuous Infusions:     Karen Kitchens  [facility] Pager [phone]  If 7PM-7AM, please contact night-coverage [email or web address] Password [email or web address] 09/08/2013, 1:35 PM   LOS: 6 days   Attending Patient was seen, examined,treatment plan was discussed with the Physician extender. I have directly reviewed the clinical findings, lab, imaging studies and management of this patient in detail. I have made the necessary changes to the above noted documentation, and agree with the documentation, as recorded  by the Physician extender.  Nena Alexander MD [facility].

## 2013-09-08 NOTE — Progress Notes (Signed)
Telephone consent obtained from Steven [name], patient's coguardian, for patient get MRI (sedated) right after EGD. Ginger Gleason RN witness.

## 2013-09-08 NOTE — Anesthesia Preprocedure Evaluation (Addendum)
Anesthesia Evaluation  Patient identified by MRN, date of birth, ID band Patient awake and Patient confused    Reviewed: Allergy & Precautions, H&P , NPO status , Patient's Chart, lab work & pertinent test results, reviewed documented beta blocker date and time   Airway Mallampati: II      Dental   Pulmonary pneumonia -, former smoker,          Cardiovascular     Neuro/Psych Anxiety    GI/Hepatic PUD,   Endo/Other    Renal/GU      Musculoskeletal   Abdominal   Peds  Hematology  (+) anemia ,   Anesthesia Other Findings Mentally handicapped patient, unable to assess airway.  Reproductive/Obstetrics                          Anesthesia Physical Anesthesia Plan  ASA: III  Anesthesia Plan: General   Post-op Pain Management:    Induction: Intravenous  Airway Management Planned: LMA  Additional Equipment:   Intra-op Plan:   Post-operative Plan: Extubation in OR  Informed Consent: I have reviewed the patients History and Physical, chart, labs and discussed the procedure including the risks, benefits and alternatives for the proposed anesthesia with the patient or authorized representative who has indicated his/her understanding and acceptance.   Dental advisory given  Plan Discussed with: CRNA, Anesthesiologist and Surgeon  Anesthesia Plan Comments:        Anesthesia Quick Evaluation

## 2013-09-08 NOTE — H&P (View-Only) (Signed)
     [facility] Progress Note  Subjective:  Is eating small amounts of full liquids, not much though per his nurse.  His sisters were not present today at the time of my visit.  Objective:  Vital signs in last 24 hours: Temp:  [98.4 F (36.9 C)-99.3 F (37.4 C)] 98.6 F (37 C) (08/25 0629) Pulse Rate:  [57-75] 57 (08/25 0629) Resp:  [16] 16 (08/25 0629) BP: (130-133)/(57-85) 133/85 mmHg (08/25 0629) SpO2:  [96 %-98 %] 98 % (08/25 0629) Weight:  [126 lb 5.2 oz (57.3 kg)] 126 lb 5.2 oz (57.3 kg) (08/25 0629) Last BM Date: 09/06/13 General:  Alert, in NAD Heart:  Regular rate and rhythm Pulm:  CTAB. Abdomen:  Soft, non-distended. Normal bowel sounds present.  He does not express any tenderness.  Difficult to examine due to contractures. Extremities:  Without edema.  Intake/Output from previous day: 08/24 0701 - 08/25 0700 In: 2706.7 [P.O.:270; I.V.:2436.7] Out: 300 [Urine:300]  Lab Results:  BMET  Recent Labs  09/05/13 0540 09/06/13 0525  NA 143 135*  K 3.8 3.8  CL 108 102  CO2 25 23  GLUCOSE 148* 118*  BUN 3* 3*  CREATININE 0.34* 0.31*  CALCIUM 9.0 8.7   Dg Esophagus Dilatation  09/05/2013   CLINICAL DATA: dysphagia   ESOPHAGEAL DILATATION  Fluoroscopy was provided for use by the requesting physician.  No images  were obtained for radiographic interpretation.     Assessment / Plan: *58 y.o. male with severe contractures, distal esopahgeal stricture (likely from GERD damage following PUD noted last month).   Need to continue twice daily PPI, changed to oral today since he is taking PO again. Need to continue strict reflux precautions.  Will plan to repeat EGD with dilation on Wednesday (1:00 pm in endoscopy).  He is OK to d/c from GI perspective after that procedure.    LOS: 5 days   ZEHR, JESSICA D.  09/07/2013, 8:59 AM  Pager number [phone]   ________________________________________________________________________  Velora Heckler GI MD  note:  I personally examined the patient, reviewed the data and agree with the assessment and plan described above.  Repeat EGD with dilation tomorrow, probably home afterwards.   Owens Loffler, MD [facility] Pager [phone]

## 2013-09-09 ENCOUNTER — Encounter (HOSPITAL_COMMUNITY): Payer: Self-pay | Admitting: Gastroenterology

## 2013-09-09 DIAGNOSIS — R634 Abnormal weight loss: Secondary | ICD-10-CM | POA: Diagnosis present

## 2013-09-09 DIAGNOSIS — E43 Unspecified severe protein-calorie malnutrition: Secondary | ICD-10-CM | POA: Diagnosis present

## 2013-09-09 DIAGNOSIS — E41 Nutritional marasmus: Secondary | ICD-10-CM

## 2013-09-09 LAB — BASIC METABOLIC PANEL
Anion gap: 9 (ref 5–15)
BUN: 4 mg/dL — ABNORMAL LOW (ref 6–23)
CO2: 25 mEq/L (ref 19–32)
Calcium: 9.3 mg/dL (ref 8.4–10.5)
Chloride: 101 mEq/L (ref 96–112)
Creatinine, Ser: 0.38 mg/dL — ABNORMAL LOW (ref 0.50–1.35)
Glucose, Bld: 154 mg/dL — ABNORMAL HIGH (ref 70–99)
POTASSIUM: 4 meq/L (ref 3.7–5.3)
Sodium: 135 mEq/L — ABNORMAL LOW (ref 137–147)

## 2013-09-09 LAB — STOOL CULTURE

## 2013-09-09 LAB — GLUCOSE, CAPILLARY: GLUCOSE-CAPILLARY: 128 mg/dL — AB (ref 70–99)

## 2013-09-09 MED ORDER — ENSURE COMPLETE PO LIQD
237.0000 mL | Freq: Three times a day (TID) | ORAL | Status: DC
  Administered 2013-09-09 – 2013-09-13 (×11): 237 mL via ORAL

## 2013-09-09 MED ORDER — WHITE PETROLATUM GEL
Status: AC
  Filled 2013-09-09: qty 5

## 2013-09-09 NOTE — Progress Notes (Signed)
Note/chart reviewed. Agree with note.   Earlisha Sharples RD, LDN, CNSC [phone] Pager [phone] After Hours Pager   

## 2013-09-09 NOTE — Discharge Summary (Signed)
Physician Discharge Summary  [name] RXV:[id number] DOB: 1955-11-27 DOA: 09/02/2013  PCP: Steven Noon, MD  Admit date: 09/02/2013 Discharge date: 09/13/2013  Time spent: 50 minutes  Recommendations for Outpatient Follow-up:  bmet in 5-7 days. Patient with severe reflux.  Needs reflux precautions (sitting up right for at least 1 hour after eating, sleeping with head up at 45 degress) BID protonix until adjusted by Dr. Ardis [name]. Follow up with Dr. Oretha [name] in 1 month. Encourage PO intake.  Monitor weight.  Discharge Diagnoses:  Principal Problem:   Stricture and stenosis of esophagus Active Problems:   Protein-calorie malnutrition, severe   Iron deficiency anemia, unspecified   Gastric ulcer, chronic   Dehydration   Odynophagia   Hypernatremia   Diastolic dysfunction   Tricuspid regurgitation   Dysphagia, unspecified(787.20)   Nonspecific (abnormal) findings on radiological and other examination of gastrointestinal tract   Loss of weight   Severe malnutrition   Discharge Condition: stable, malnourished  Diet recommendation: d3, regular diet, ensure supplementation   History of present illness:   58 year old male with a history of mental retardation, hyperlipidemia was taken to urgent care because of poor by mouth intake. Apparently the patient has lost a significant amount of weight over the last few weeks. The patient was [facility] from [facility] on 08/14/2013 after an admission for sepsis and acute blood loss anemia secondary to NSAID-induced gastric and duodenal ulcerations. His colonoscopy was normal. He was discharged to the group home. He was readmitted due to 2 failure to thrive, dysphasia, and decreased oral intake.   Hospital Course:   Esphagitis/Esophageal Stricture with dysphagia  Cause of dysphagia and odynaphagia ENT--Dr. Lucia [name] performed laryngoscopy (8/22) which was negative for any abscess or obstruction  Dr. Lucia [name] also attempted to place NG tube  laryngoscope, but met resistance  09/05/2013 EGD--severe esophageal stricture of lower third of the esophagus, grade D esophagitis, nonbleeding ulcers in the prepyloric and duodenal bulb  Esophageal dilation x 2 (on 8/23 and 8/26)  Clinical swallow eval. Recommends dysphagia 3  On BID PPI. Bx negative for H pylori.  NSAIDS stopped.  Patient tolerating fulls will advance to D3 with dinner (8/27)   Severe Malnutrition/failure to thrive.  Patient has lost 32% of his body weight in 5 weeks!  Appreciate nutrition consultation  Tolerating full liquids. Advancing to D3 with ST's blessing. Re-order ST if there are any difficulties.  TSH 2.380  Serum B12--1109  A.m. cortisol 23.5   Dehydration with hypernatremia  Due to little or no PO intake.  Resolved with IV hydration  Now able to tolerate PO intake s/p dilation   Contracture  Suspect these are due to progressive changes. Dr Steven [name] spoke patient's sister's at bedside-patient has not been ambulatory since around thanksgiving of last year. Since then-he has had right ankle fracture that required surgery. Patient has had progressive problems with ambulation since then.  PT for ROM  Received MRI brain under propofol. Negative for acute etiology of contractures.  Start low dose baclofen.  Family desires [facility] at discharge.   Diarrhea  c-diff negative.  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  Patient currently euvolemic - not volume overloaded.   Iron deficiency  Given a dose of IV Nulecit  difficult situation due to pt's chronic constipation to give po iron   Procedures: Upper EGD with Dilation x 2  Consultations: GI - Dr. Oretha [name]  Discharge Exam: Filed Vitals:   09/13/13 0521  BP: 157/92  Pulse:  90  Temp: 98.6 F (37 C)  Resp: 16   Filed Weights   09/07/13 0629 09/08/13 0436 09/09/13 0524  Weight: 57.3 kg (126 lb 5.2 oz) 59.4 kg (130 lb 15.3 oz) 59.6 kg (131 lb 6.3 oz)    General: Pt  is alert, NAD, Contracted. Usually speaks 1 word repeatedly. HEENT: No icterus, No thrush, [place]/AT  Cardiovascular: RRR, S1/S2, no rubs, no gallops  Respiratory: Poor inspiratory effort but clear to auscultation. No wheezing.  Abdomen: thin, Soft/+BS, epigastric tenderness to palpation., non distended, no guarding  Extremities: No edema, No petechiae, No rashes, Arms and legs are contracted.  Skin: Dry    Discharge Instructions     Discharge Instructions   Diet general    Complete by:  As directed   Dys 3 diet     Increase activity slowly    Complete by:  As directed             Medication List    STOP taking these medications       amoxicillin-clavulanate 875-125 MG per tablet  Commonly known as:  AUGMENTIN     chlorhexidine 0.12 % solution  Commonly known as:  PERIDEX     esomeprazole 40 MG capsule  Commonly known as:  NEXIUM     ferrous sulfate 325 (65 FE) MG tablet  Commonly known as:  FERROUSUL      TAKE these medications       acetaminophen 325 MG tablet  Commonly known as:  TYLENOL  Take 2 tablets (650 mg total) by mouth every 6 (six) hours as needed for mild pain (or Fever >/= 101).     atorvastatin 10 MG tablet  Commonly known as:  LIPITOR  Take 10 mg by mouth daily.     baclofen 5 mg Tabs tablet  Commonly known as:  LIORESAL  Take 0.5 tablets (5 mg total) by mouth 3 (three) times daily.     cholecalciferol 1000 UNITS tablet  Commonly known as:  VITAMIN D  Take 2,000 Units by mouth daily.     feeding supplement (ENSURE COMPLETE) Liqd  Take 237 mLs by mouth 3 (three) times daily between meals.     pantoprazole 40 MG tablet  Commonly known as:  PROTONIX  Take 1 tablet (40 mg total) by mouth daily.     polyethylene glycol packet  Commonly known as:  MIRALAX / GLYCOLAX  Take 17 g by mouth daily.     psyllium 58.6 % powder  Commonly known as:  METAMUCIL  Take 1 packet by mouth daily.       Allergies  Allergen Reactions  . Haldol  [Haloperidol Lactate] Palpitations    Sleeps for days      The results of significant diagnostics from this hospitalization (including imaging, microbiology, ancillary and laboratory) are listed below for reference.    Significant Diagnostic Studies: Dg Ankle 2 Views Left  08/13/2013   CLINICAL DATA:  Left ankle pain  EXAM: LEFT ANKLE - 2 VIEW  COMPARISON:  None  FINDINGS: Three views of the left ankle submitted. No acute fracture or subluxation. No radiopaque foreign body.  IMPRESSION: Negative.   Electronically Signed   By: Natasha Mead M.D.   On: 08/13/2013 16:09   Mr Brain Wo Contrast  09/08/2013   CLINICAL DATA:  Lower extremity weakness.  Contractions.  EXAM: MRI HEAD WITHOUT CONTRAST  TECHNIQUE: Multiplanar, multiecho pulse sequences of the brain and surrounding structures were obtained without intravenous contrast.  COMPARISON:  None.  FINDINGS: The study was performed under general anesthesia. Overall diagnostic images were obtained.  No evidence for acute infarction, hemorrhage, mass lesion, hydrocephalus, or extra-axial fluid. Premature for age cerebral and cerebellar atrophy. Mild subcortical and periventricular T2 and FLAIR hyperintensities, likely chronic microvascular ischemic change. Accentuated signal in the subarachnoid spaces on FLAIR imaging reflects increased oxygenation of general anesthesia.  Flow voids are maintained throughout the carotid, basilar, and vertebral arteries. There are no areas of chronic hemorrhage. Pituitary, pineal, and cerebellar tonsils unremarkable. No upper cervical lesions. Visualized calvarium, skull base, and upper cervical osseous structures unremarkable. Scalp and extracranial soft tissues, orbits, sinuses, and mastoids show no acute process.  IMPRESSION: Chronic changes as described.  No acute intracranial findings.  No abnormality is detected which might contribute to lower extremity weakness.   Electronically Signed   By: Rolla Flatten M.D.   On:  09/08/2013 12:03   Portable Chest 1 View  09/03/2013   CLINICAL DATA:  Evaluate for infiltrates  EXAM: PORTABLE CHEST - 1 VIEW  COMPARISON:  09/13/2013  FINDINGS: Suboptimal study due to patient rotation. There is no gross change in the normal heart size and mild aortic tortuosity. There is no edema, consolidation, effusion, or pneumothorax.  IMPRESSION: No evidence of active cardiopulmonary disease.   Electronically Signed   By: Jorje Guild M.D.   On: 09/03/2013 01:05   Dg Abd Acute W/chest  08/26/2013   CLINICAL DATA:  Emesis.  Poorly eating.  EXAM: ACUTE ABDOMEN SERIES (ABDOMEN 2 VIEW & CHEST 1 VIEW)  COMPARISON:  Chest x-ray a 08/10/2013. Abdominal radiograph 08/08/2013.  FINDINGS: Lung volumes are low. Slight coarsening of interstitial markings, similar to prior examinations. No consolidative airspace disease. No pleural effusions. No pneumothorax. No pulmonary nodule or mass noted. Pulmonary vasculature and the cardiomediastinal silhouette are within normal limits.  Gas and stool are seen scattered throughout the colon extending to the level of the distal rectum. No pathologic distension of small bowel is noted. No gross evidence of pneumoperitoneum. Multiple nondilated gas-filled loops of small bowel throughout the central abdomen.  IMPRESSION: 1. Nonspecific, nonobstructive bowel gas pattern. 2. No pneumoperitoneum. 3. Low lung volumes without radiographic evidence of acute cardiopulmonary disease.   Electronically Signed   By: Vinnie Langton M.D.   On: 08/26/2013 18:58   Dg Loyce Dys Tube Plc W/fl W/rad  09/04/2013   CLINICAL DATA:  Unable to pass nasogastric tube.  EXAM: NASO G TUBE PLACEMENT WITH FL AND WITH RAD  TECHNIQUE: Under direct fluoroscopic guidance the nasogastric tube was attempted to be passed into the stomach.  CONTRAST:  10 cc of thin barium  FLUOROSCOPY TIME:  5 min  COMPARISON:  None  FINDINGS: Initially the nasogastric tube was introduced through the left nostril and into the  mid esophagus. The nasogastric tube was unable to be passed into the distal esophagus. A few cc of barium or injected into the nasogastric tube opacifying the mid and distal esophagus. Within the distal half of the esophagus there is a long segment of marked luminal narrowing. This appears to account for the inability to pass nasogastric tube into the stomach.  IMPRESSION: 1. Apparent long segment of marked luminal narrowing involving the distal esophagus through which the nasogastric tube was unable to be passed. This may represent a benign or malignant stricture. Endoscopic evaluation is recommended.   Electronically Signed   By: Kerby Moors M.D.   On: 09/04/2013 16:21   Dg Ugi W/o Kub  09/03/2013  CLINICAL DATA:  Nausea, vomiting. History of gastric and duodenal ulcers.  EXAM: UPPER GI SERIES WITHOUT KUB  TECHNIQUE: Routine upper GI series was performed with high-density barium.  FLUOROSCOPY TIME:  1 min 47 seconds  COMPARISON:  08/26/2013 radiographs.  FINDINGS: The patient was either unable or would not drink contrast. The patient could not be positioned in the standard supine position. Despite the lateral positioning in the fetal position, I attempted to proceed with this study however the patient would not ingest contrast. In an attempt to stimulate swallowing, 2 mL of thick barium contrast was placed in the patient's mouth. Over the course of 1 min and 30 seconds, the patient did eventually swallow and this tiny bolus was observed to enter the esophagus. At this point, this study was deemed not feasible and terminated.  IMPRESSION: Aborted upper GI study.  The patient would not drink contrast.  I discussed the case with Dr. Fuller Plan. The patient had a recent endoscopy and the upper GI study is to ensure continued patency. The patient will require an enteric tube and then an upper GI and study could be performed through the enteric tube. Given the patient's positioning, the upper GI study even with a tube  will be limited but should be adequate to assess for gross patency of the upper GI tract.   Electronically Signed   By: Dereck Ligas M.D.   On: 09/03/2013 15:04   Dg Esophagus Dilatation  09/05/2013   CLINICAL DATA: dysphagia   ESOPHAGEAL DILATATION  Fluoroscopy was provided for use by the requesting physician.  No images  were obtained for radiographic interpretation.    Dg Swallowing Func-speech Pathology  08/12/2013   Katherene Ponto [place], CCC-SLP     08/12/2013  2:59 PM Objective Swallowing Evaluation: Modified Barium Swallowing Study   Patient Details  Name: Steven GOLPHIN MRN: [id number] Date of Birth: [birth year]  Today's Date: 08/12/2013 Time: 1305-1340 SLP Time Calculation (min): 35 min  Past Medical History:  Past Medical History  Diagnosis Date  . Hyperlipidemia   . Anxiety   . Mental retardation     ue to temp of 108 aftrer having measles age 63.  . Hx of diaper rash   . Edema of foot 02/06/2012    BILATERAL   Past Surgical History:  Past Surgical History  Procedure Laterality Date  . Orif ankle fracture  02/06/2012    RIGHT ANKLE  . Orif ankle fracture  02/06/2012    Procedure: OPEN REDUCTION INTERNAL FIXATION (ORIF) ANKLE  FRACTURE;  Surgeon: Wylene Simmer, MD;  Location: [facility];  Service:  Orthopedics;  Laterality: Right;  . Syndesmosis repair  02/06/2012    Procedure: SYNDESMOSIS REPAIR;  Surgeon: Wylene Simmer, MD;   Location: [facility];  Service: Orthopedics;  Laterality: Right;  tri  maleolar  . Colonoscopy N/A 08/11/2013    Procedure: COLONOSCOPY;  Surgeon: Gatha Mayer, MD;   Location: [facility];  Service: Endoscopy;  Laterality: N/A;  . Esophagogastroduodenoscopy N/A 08/11/2013    Procedure: ESOPHAGOGASTRODUODENOSCOPY (EGD);  Surgeon: Gatha Mayer, MD;  [facility];  Service: Endoscopy;   Laterality: N/A;   HPI:  58 year old male with a history of mental retardation,  hyperlipidemia was taken to urgent care because of poor by mouth  intake. Apparently the patient has lost a  significant amount of  weight over the last few weeks. The patient was transferred to  the ED and noted to be hypotensive and have leukocytosis with WBC  18.1.  He was seen by GI, determined to have a bleed and EGD  showed large ulcers, suspected to be caused by NSAID.  The  patient was started on intravenous antibiotics for sepsis  secondary to healthcare associated pneumonia and possible  aspiration (right base infiltrate).      Assessment / Plan / Recommendation Clinical Impression  Dysphagia Diagnosis: Mild oral phase dysphagia;Mild pharyngeal  phase dysphagia Clinical impression: Pt presents with a mild oral dysphagia with  tongue pumping, anterior mastication due to missing posterior  dentition. Is able to manage soft solids well. Pt does have a  delayed swallow response, though with consecutive boluses pt is  able to maintain hyolaryngeal elevation and airway protection.  There was one instance of trace penetration above the cords with  mild pyriform sinuses residuals present post swallow with large  sips. At this time, aspiration risk appears quite low. Pt is  recommended to consume mechanical soft diet with thin liquids  with normal PO intake habits (self feeding though somewhat  impulsive). If family notices possible signs of aspiration,  recommend full supervision with POs for small single sips,  restricting straws. No SLP f/u needed at this time.     Treatment Recommendation  No treatment recommended at this time    Diet Recommendation Dysphagia 3 (Mechanical Soft);Thin liquid   Liquid Administration via: Cup;Straw Medication Administration: Whole meds with puree Supervision: Staff to assist with self feeding Compensations: Slow rate;Small sips/bites Postural Changes and/or Swallow Maneuvers: Seated upright 90  degrees    Other  Recommendations Oral Care Recommendations: Oral care BID   Follow Up Recommendations  Other (comment) (group home)    Frequency and Duration        Pertinent Vitals/Pain NA     SLP Swallow Goals     General HPI: 58 year old male with a history of mental  retardation, hyperlipidemia was taken to urgent care because of  poor by mouth intake. Apparently the patient has lost a  significant amount of weight over the last few weeks. The patient  was transferred to the ED and noted to be hypotensive and have  leukocytosis with WBC 18.1.  He was seen by GI, determined to  have a bleed and EGD showed large ulcers, suspected to be caused  by NSAID.  The patient was started on intravenous antibiotics for  sepsis secondary to healthcare associated pneumonia and possible  aspiration (right base infiltrate).  Type of Study: Modified Barium Swallowing Study Reason for Referral: Objectively evaluate swallowing function Previous Swallow Assessment: none Diet Prior to this Study: Other (Comment) (SOFT) Temperature Spikes Noted: No Respiratory Status: Room air History of Recent Intubation: No Behavior/Cognition: Alert;Cooperative;Pleasant mood;Doesn't  follow directions Oral Cavity - Dentition: Missing dentition Oral Motor / Sensory Function: Within functional limits Self-Feeding Abilities: Able to feed self;Needs assist Patient Positioning: Upright in chair Baseline Vocal Quality: Clear Volitional Cough: Cognitively unable to elicit Volitional Swallow: Unable to elicit Anatomy: Within functional limits Pharyngeal Secretions: Not observed secondary MBS    Reason for Referral Objectively evaluate swallowing function   Oral Phase Oral Preparation/Oral Phase Oral Phase: Impaired Oral - Thin Oral - Thin Cup: Lingual pumping Oral - Thin Straw: Lingual pumping Oral - Solids Oral - Puree: Lingual pumping Oral - Mechanical Soft: Impaired mastication (only anterior  dentition)   Pharyngeal Phase Pharyngeal Phase Pharyngeal Phase: Impaired Pharyngeal - Thin Pharyngeal - Thin Cup: Delayed swallow initiation;Pharyngeal  residue - pyriform sinuses Pharyngeal - Thin Straw: Delayed swallow initiation;Pharyngeal  residue -  pyriform sinuses;Penetration/Aspiration after swallow Penetration/Aspiration details (thin straw): Material enters  airway, remains ABOVE vocal cords then ejected out Pharyngeal - Solids Pharyngeal - Puree: Delayed swallow initiation Pharyngeal - Mechanical Soft: Delayed swallow initiation  Cervical Esophageal Phase    GO    Cervical Esophageal Phase Cervical Esophageal Phase: WFL (appearance of possible hiatal  hernia, no radiologist present)        Herbie Baltimore, MA CCC-SLP [phone]  DeBlois, Katherene Ponto 08/12/2013, 2:57 PM     Microbiology: Recent Results (from the past 240 hour(s))  STOOL CULTURE     Status: None   Collection Time    09/05/13  5:18 PM      Result Value Ref Range Status   Specimen Description STOOL   Final   Special Requests NONE   Final   Culture     Final   Value: NO SALMONELLA, SHIGELLA, CAMPYLOBACTER, YERSINIA, OR E.COLI [id number]:[id number] ISOLATED     Performed at [facility]   Report Status 09/09/2013 FINAL   Final  CLOSTRIDIUM DIFFICILE BY PCR     Status: None   Collection Time    09/05/13  5:18 PM      Result Value Ref Range Status   C difficile by pcr NEGATIVE  NEGATIVE Final     Labs: Basic Metabolic Panel:  Recent Labs Lab 09/09/13 1102  NA 135*  K 4.0  CL 101  CO2 25  GLUCOSE 154*  BUN 4*  CREATININE 0.38*  CALCIUM 9.3   Liver Function Tests: No results found for this basename: AST, ALT, ALKPHOS, BILITOT, PROT, ALBUMIN,  in the last 168 hours CBC: No results found for this basename: WBC, NEUTROABS, HGB, HCT, MCV, PLT,  in the last 168 hours Cardiac Enzymes: No results found for this basename: CKTOTAL, CKMB, CKMBINDEX, TROPONINI,  in the last 168 hours BNP: BNP (last 3 results)  Recent Labs  08/10/13 0415  PROBNP 1887.0*   Signed:  Karen Kitchens [phone]  [facility] 09/13/2013, 9:39 AM  Attending Patient was seen, examined,treatment plan was discussed with the Physician extender. I have directly reviewed  the clinical findings, lab, imaging studies and management of this patient in detail. I have made the necessary changes to the above noted documentation, and agree with the documentation, as recorded by the Physician extender.  Nena Alexander MD [facility].

## 2013-09-09 NOTE — Clinical Social Work Note (Addendum)
Awaiting [facility] review for SNF placement- updated family and anticitpating d/c to SNF once stable and [facility] # rec'd.  Eduard Clos, MSW, [place]

## 2013-09-09 NOTE — Progress Notes (Signed)
Physical Therapy Treatment Patient Details Name: Steven [name] MRN: [id number] DOB: 08/06/1955 Today's Date: 09/09/2013    History of Present Illness Patient is a 58 y/o male with history of MR recently discharged from hospital on 8/1 following similar presentation with poor by mouth intake and weight loss. EGD revealed Large ulcer  in the prepyloric region of the stomach and multiple small medium sized non-bleeding ulcers in the duodenal bulb. S/p EGD 09/05/13--severe esophageal stricture, grade D esophagitis. s/p EGD 8/26.    PT Comments    Patient progressing with mobility. More tolerable stretching contractures of BUEs/BLEs today with less pain. Able to sit EOB unsupported for short period of time however requires constant cues to stay on task and to participate in therapy. Perseverating on wanting to "go bed, go bed." Will continue to work on dynamic sitting balance and attempt transfer to chair next session as tolerated.    Follow Up Recommendations  SNF;Supervision/Assistance - 24 hour     Equipment Recommendations  None recommended by PT    Recommendations for Other Services       Precautions / Restrictions Precautions Precautions: Fall Precaution Comments: Contractures BLEs. Restrictions Weight Bearing Restrictions: No    Mobility  Bed Mobility Overal bed mobility: +2 for physical assistance Bed Mobility: Rolling;Sidelying to Sit;Sit to Sidelying Rolling: Total assist;+2 for physical assistance Sidelying to sit: Total assist;+2 for physical assistance     Sit to sidelying: Max assist General bed mobility comments: Rolling to right/left with Total A. Did not appear to give any effort during bed mobility. Able to bring BLEs into bed secondary to severe contractures BLEs as BLEs followed trunk into bed.  Transfers                 General transfer comment: TBA. Pt shaking head constantly when asked about transferring to chair. "to bed" "to  bed"  Ambulation/Gait                 Stairs            Wheelchair Mobility    Modified Rankin (Stroke Patients Only)       Balance Overall balance assessment: Needs assistance   Sitting balance-Leahy Scale: Fair Sitting balance - Comments: Able to sit EOB ~14 minutes with Mod A due to pt resisting therapist to try to return to supine progressing to min guard with multimodal cues for upright. Fatigues easily. Performed dynamic sitting balance activities reaching outside BoS x2 minutes with frequent visual  and verbal cues. Able to sit EOB without UE support for 3 minutes and when fatigued slow LOB to left, however able to self correct with stimulus to right lateral trunk flexors PRN. Postural control: Left lateral lean   Standing balance-Leahy Scale: Zero                      Cognition Arousal/Alertness: Awake/alert Behavior During Therapy: WFL for tasks assessed/performed Overall Cognitive Status: History of cognitive impairments - at baseline                      Exercises Other Exercises Other Exercises: Performed manual stretching to knee flexors, hip flexors in supine position and in seated position. ~45 sec-65minute x3 to pt tolerance.  Other Exercises: Performed manual stretching to BUE elbow flexors. ~45 sec-1 minute x3    General Comments        Pertinent Vitals/Pain Pain Score:  (not rated on pain scale.) Pain Location: Moaning  when mobilizing BLEs inconsistently. Pain Descriptors / Indicators: Grimacing;Sore Pain Intervention(s): Monitored during session;Repositioned    Home Living                      Prior Function            PT Goals (current goals can now be found in the care plan section) Progress towards PT goals: Progressing toward goals    Frequency       PT Plan Current plan remains appropriate    Co-evaluation             End of Session   Activity Tolerance: Patient limited by pain Patient  left: in bed;with call bell/phone within reach;with bed alarm set     Time: 4540-9811 PT Time Calculation (min): 23 min  Charges:  $Therapeutic Exercise: 8-22 mins $Neuromuscular Re-education: 8-22 mins                    G CodesCandy Sledge A Sep 10, 2013, 3:36 PM Candy Sledge, PT, DPT [phone]

## 2013-09-09 NOTE — Progress Notes (Signed)
Attempts to obtain level 2 pasarr continue. Notified by [place] MUST that further information was required to proceed with Level 2 screen. This information was sent to [facility] MUST for review. Awaiting PASARR number as bed search remains in place for SNF bed.  Lorie Phenix. Pauline Good, [place]

## 2013-09-09 NOTE — Progress Notes (Signed)
NUTRITION FOLLOW UP  Intervention:    Continue to provide Ensure Complete po BID, each supplement provides 350 kcal and 13 grams of protein, will change to TID.  Discontinue order of [employer], per family request  Encourage PO intake  Will continue to monitor  Nutrition Dx:   Inadequate oral intake related to inability to swallow as evidenced by weight loss and reported gurgling noise upon swallowing., improving (PO intake 80%)  Goal:   Pt to meet >/= 90% of their estimated nutrition needs, meeting   Monitor:   Weight trends, PO intake, supplement acceptance, labs, I/O's  Assessment:   8/27 TF d/c  Pt current diet: Dys 3 with thin liquids. Per family, appetite has improved, pt eating and tolerating very well. Last documented PO intake was 80%. During visit, family was helping pt drink Ensure supplement. Family prefers Ensure supplement over [employer], will discontinue order. Will increase Ensure supplement order from BID to TID.   Labs reviewed:  8/21  - Pt with history of mental retardation and is mostly nonverbal.  - Obtained nutritional history from pt's family. They report that pt usually has a large appetite and eats until the point where they have to stop giving him food. They report that this changed about 2 months ago. They suspect that he is unable to swallow due to hearing gurgling sounds when he tries to eat. They report that pt had a swallow study during last hospital stay which he passed.  - Spoke with RN who said pt was not eating or drinking. She said he spit out his jello and would not drink the coke he asked for.  - Pt has lost 49 lbs in less than a month and a half.   Height: Ht Readings from Last 1 Encounters:  09/02/13 5\' 7"  (1.702 m)    Weight Status:   Wt Readings from Last 1 Encounters:  09/09/13 131 lb 6.3 oz (59.6 kg)    Re-estimated needs:  Kcal: 1400-1600 Protein: 70-80g Fluid: >1.5 L/day  Skin: blister, abrasion, RLE + LLE  edema  Diet Order: Dysphagia   Intake/Output Summary (Last 24 hours) at 09/09/13 1129 Last data filed at 09/09/13 0900  Gross per 24 hour  Intake    660 ml  Output   1300 ml  Net   -640 ml    Last BM: 8/24   Labs:   Recent Labs Lab 09/04/13 0756 09/05/13 0540 09/06/13 0525  NA 145 143 135*  K 3.8 3.8 3.8  CL 110 108 102  CO2 22 25 23   BUN 5* 3* 3*  CREATININE 0.38* 0.34* 0.31*  CALCIUM 9.0 9.0 8.7  GLUCOSE 131* 148* 118*    CBG (last 3)  No results found for this basename: GLUCAP,  in the last 72 hours  Scheduled Meds: . atorvastatin  10 mg Oral Daily  . baclofen  5 mg Oral TID  . cetaphil   Topical Daily  . enoxaparin (LOVENOX) injection  40 mg Subcutaneous Q24H  . feeding supplement (ENSURE COMPLETE)  237 mL Oral BID BM  . feeding supplement (RESOURCE BREEZE)  1 Container Oral TID BM  . magic mouthwash w/lidocaine  10 mL Oral QID  . pantoprazole  40 mg Oral BID  . polyethylene glycol  17 g Oral BID    Continuous Infusions:   Clayton Bibles, MS, PLDN Provisionally Licensed Dietitian Nutritionist Pager: [phone]

## 2013-09-09 NOTE — Progress Notes (Signed)
PROGRESS NOTE  Steven [name] KPV:[id number] DOB: [birth year] DOA: 09/02/2013 PCP: [name], MD  Interim summary 58 year old male with a history of mental retardation, hyperlipidemia was taken to urgent care because of poor by mouth intake. Apparently the patient has lost a significant amount of weight over the last few weeks. The patient was discharged from [facility] on 08/14/2013 after an admission for sepsis and acute blood loss anemia secondary to NSAID-induced gastric and duodenal ulcerations. His colonoscopy was normal. He was discharged to the group home. He was readmitted due to 2 failure to thrive, dysphasia, and decreased oral intake. The patient was fluid resuscitated for his dehydration. 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 He underwent dilatation at that time.. The patient was seen by ENT was performed upper airway laryngoscopy. This was negative. Because of the patient's progressive dysphagia and reported lower extremity weakness, MRI of the brain has been planned. Because of the patient's contractures and agitation, this has been coordinated to be performed under conscious sedation after his endoscopy on 09/08/2013. The patient is to have repeat EGD with esophageal dilatation on 09/08/2013.  Assessment/Plan: dehydration with hypernatremia  Due to little or no PO intake.  Resolved with IV hydration Now able to tolerate liquids s/p dilation  Esphagitis/Esophageal Stricture with dysphagia contributing to pt's dysphagia  ENT--Dr. [name] performed laryngoscopy (8/22) which was negative for any abscess or obstruction  Dr. [name] also attempted to place NG tube laryngoscope, but met resistance  09/05/2013 EGD--severe esophageal stricture of lower third of the esophagus, grade D esophagitis, nonbleeding ulcers in the prepyloric and duodenal bulb  Esophageal dilation x 2 (on 8/23 and 8/26) Clinical swallow eval.   Recommends dysphagia 3  On BID PPI IV. Bx negative for H pylori.  NSAIDS stopped.  Patient tolerating fulls will advance to D3ith dinner (8/27)  Severe Malnutrition/failure to thrive.  Patient has lost 32% of his body weight in 5 weeks!  Appreciate nutrition consultation  Tolerating full liquids.  Advancing to D3 with ST's blessing.  Re-order ST if there are any difficulties. TSH 2.380  Serum B12--1109  A.m. 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 Dr [name] spoke patient's sister's at bedside-patient has not been ambulatory since around thanksgiving of last year.  Since then-he has had right ankle fracture that required surgery. Patient has had progressive problems with ambulation since then. PT for ROM  Received MRI brain under propofol.  Negative for acute etiology of contractures. Start low dose baclofen. Family desires [facility] SN[facility] at discharge.   Diarrhea  c-diff negative.  One liquid bowel movement (8/24).  Have requested RNs document BMs.  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  Patient currently  euvolemic - not volume overloaded.   Iron deficiency  Given a dose of IV Nulecit  difficult situation due to pt's chronic constipation to give po iron   Family Communication: family not present at bedside today, but they are aware of the plan. Disposition Plan: SNF at discharge.  Family prefers [facility] if possible.  Awaiting PASAR Level 2   Procedures/Studies:  Portable Chest 1 View  2013-09-10   CLINICAL DATA:  Evaluate for infiltrates  EXAM: PORTABLE CHEST - 1 VIEW  COMPARISON:  09/13/2013  FINDINGS: Suboptimal study due to patient rotation. There is no gross change in the normal heart size and mild aortic tortuosity. There is no edema,  consolidation, effusion, or pneumothorax.  IMPRESSION: No evidence of active cardiopulmonary disease.   Electronically Signed   By: Jorje Guild M.D.   On: 09/03/2013 01:05    Dg Abd Acute  W/chest  08/26/2013   CLINICAL DATA:  Emesis.  Poorly eating.  EXAM: ACUTE ABDOMEN SERIES (ABDOMEN 2 VIEW & CHEST 1 VIEW)  COMPARISON:  Chest x-ray a 08/10/2013. Abdominal radiograph 08/08/2013.  FINDINGS: Lung volumes are low. Slight coarsening of interstitial markings, similar to prior examinations. No consolidative airspace disease. No pleural effusions. No pneumothorax. No pulmonary nodule or mass noted. Pulmonary vasculature and the cardiomediastinal silhouette are within normal limits.  Gas and stool are seen scattered throughout the colon extending to the level of the distal rectum. No pathologic distension of small bowel is noted. No gross evidence of pneumoperitoneum. Multiple nondilated gas-filled loops of small bowel throughout the central abdomen.  IMPRESSION: 1. Nonspecific, nonobstructive bowel gas pattern. 2. No pneumoperitoneum. 3. Low lung volumes without radiographic evidence of acute cardiopulmonary disease.   Electronically Signed   By: Vinnie Langton M.D.   On: 08/26/2013 18:58      Dg Loyce Dys Tube Plc W/fl W/rad  09/04/2013   CLINICAL DATA:  Unable to pass nasogastric tube.  EXAM: NASO G TUBE PLACEMENT WITH FL AND WITH RAD  TECHNIQUE: Under direct fluoroscopic guidance the nasogastric tube was attempted to be passed into the stomach.  CONTRAST:  10 cc of thin barium  FLUOROSCOPY TIME:  5 min  COMPARISON:  None  FINDINGS: Initially the nasogastric tube was introduced through the left nostril and into the mid esophagus. The nasogastric tube was unable to be passed into the distal esophagus. A few cc of barium or injected into the nasogastric tube opacifying the mid and distal esophagus. Within the distal half of the esophagus there is a long segment of marked luminal narrowing. This appears to account for the inability to pass nasogastric tube into the stomach.  IMPRESSION: 1. Apparent long segment of marked luminal narrowing involving the distal esophagus through which the nasogastric tube  was unable to be passed. This may represent a benign or malignant stricture. Endoscopic evaluation is recommended.   Electronically Signed   By: Kerby Moors M.D.   On: 09/04/2013 16:21   Dg Ugi W/o Kub  09/03/2013   CLINICAL DATA:  Nausea, vomiting. History of gastric and duodenal ulcers.  EXAM: UPPER GI SERIES WITHOUT KUB  TECHNIQUE: Routine upper GI series was performed with high-density barium.  FLUOROSCOPY TIME:  1 min 47 seconds  COMPARISON:  08/26/2013 radiographs.  FINDINGS: The patient was either unable or would not drink contrast. The patient could not be positioned in the standard supine position. Despite the lateral positioning in the fetal position, I attempted to proceed with this study however the patient would not ingest contrast. In an attempt to stimulate swallowing, 2 mL of thick barium contrast was placed in the patient's mouth. Over the course of 1 min and 30 seconds, the patient did eventually swallow and this tiny bolus was observed to enter the esophagus. At this point, this study was deemed not feasible and terminated.  IMPRESSION: Aborted upper GI study.  The patient would not drink contrast.  I discussed the case with Dr. Fuller Plan. The patient had a recent endoscopy and the upper GI study is to ensure continued patency. The patient will require an enteric tube and then an upper GI and study could be performed through the enteric tube. Given the patient's positioning,  the upper GI study even with a tube will be limited but should be adequate to assess for gross patency of the upper GI tract.   Electronically Signed   By: Dereck Ligas M.D.   On: 09/03/2013 15:04   Dg Esophagus Dilatation  09/05/2013   CLINICAL DATA: dysphagia   ESOPHAGEAL DILATATION  Fluoroscopy was provided for use by the requesting physician.  No images  were obtained for radiographic interpretation.     Subjective: Patient has severe MR and is unable to communicate his needs.  Objective: Filed Vitals:    09/08/13 1214 09/08/13 1444 09/08/13 2136 09/09/13 0524  BP: 107/67 133/78 128/83 138/84  Pulse: 75 84 92 72  Temp: 98 F (36.7 C) 98.4 F (36.9 C) 97.9 F (36.6 C) 98.5 F (36.9 C)  TempSrc: Axillary Oral Oral Oral  Resp: _0 Height:      Weight:    59.6 kg (131 lb 6.3 oz)  SpO2: 98% 98% 97% 96%    Intake/Output Summary (Last 24 hours) at 09/09/13 1325 Last data filed at 09/09/13 1100  Gross per 24 hour  Intake    660 ml  Output   1550 ml  Net   -890 ml   Weight change: 0.2 kg (7.1 oz)  Exam:   General:  Pt is alert, NAD, Contracted.  Repeats the word "bye" in response to my greeting.  HEENT: No icterus, No thrush, [place]/AT  Cardiovascular: RRR, S1/S2, no rubs, no gallops  Respiratory: Poor inspiratory effort but clear to auscultation. No wheezing.  Abdomen: thin, Soft/+BS, epigastric tenderness to palpation., non distended, no guarding  Extremities: No edema,  No petechiae, No rashes, Arms and legs are contracted.  Skin:  Dry  Data Reviewed: Basic Metabolic Panel:  Recent Labs Lab 09/03/13 0605 09/04/13 0756 09/05/13 0540 09/06/13 0525 09/09/13 1102  NA 149* 145 143 135* 135*  K 3.3* 3.8 3.8 3.8 4.0  CL 110 110 108 102 101  CO2 _1 GLUCOSE 83 131* 148* 118* 154*  BUN 11 5* 3* 3* 4*  CREATININE 0.43* 0.38* 0.34* 0.31* 0.38*  CALCIUM 9.4 9.0 9.0 8.7 9.3   Liver Function Tests:  Recent Labs Lab 09/02/13 2143 09/03/13 0605  AST 14 11  ALT 10 11  ALKPHOS 82 82  BILITOT 0.4 0.4  PROT 6.3 6.0  ALBUMIN 2.6* 2.6*   CBC:  Recent Labs Lab 09/02/13 2143 09/03/13 0605 09/04/13 0756  WBC 6.7 6.1 9.2  HGB 12.7* 13.0 12.9*  HCT 41.8 42.3 40.8  MCV 89.9 91.4 90.7  PLT 383 436* 335   Cardiac Enzymes:  Recent Labs Lab 09/04/13 0756  CKTOTAL 49    Recent Results (from the past 240 hour(s))  URINE CULTURE     Status: None   Collection Time    09/03/13  6:16 AM      Result Value Ref Range Status   Specimen Description  URINE, CATHETERIZED   Final   Special Requests NONE   Final   Culture  Setup Time     Final   Value: 09/03/2013 14:28     Performed at [place]     Final   Value: >=100,000 COLONIES/ML     Performed at [facility]   Culture     Final   Value: Multiple bacterial morphotypes present, none predominant. Suggest appropriate recollection if clinically indicated.     Performed at [facility]  Report Status 09/04/2013 FINAL   Final  STOOL CULTURE     Status: None   Collection Time    09/05/13  5:18 PM      Result Value Ref Range Status   Specimen Description STOOL   Final   Special Requests NONE   Final   Culture     Final   Value: NO SALMONELLA, SHIGELLA, CAMPYLOBACTER, YERSINIA, OR E.COLI 0157:H7 ISOLATED     Performed at [facility]   Report Status 09/09/2013 FINAL   Final  CLOSTRIDIUM DIFFICILE BY PCR     Status: None   Collection Time    09/05/13  5:18 PM      Result Value Ref Range Status   C difficile by pcr NEGATIVE  NEGATIVE Final     Scheduled Meds: . atorvastatin  10 mg Oral Daily  . baclofen  5 mg Oral TID  . cetaphil   Topical Daily  . enoxaparin (LOVENOX) injection  40 mg Subcutaneous Q24H  . feeding supplement (ENSURE COMPLETE)  237 mL Oral TID BM  . magic mouthwash w/lidocaine  10 mL Oral QID  . pantoprazole  40 mg Oral BID  . polyethylene glycol  17 g Oral BID   Continuous Infusions:   Karen Kitchens  [facility] Pager [phone]  If 7PM-7AM, please contact night-coverage [email or web address] Password [email or web address] 09/09/2013, 1:25 PM   LOS: 7 days   Attending Patient was seen, examined,treatment plan was discussed with the Physician extender. I have directly reviewed the clinical findings, lab, imaging studies and management of this patient in detail. I have made the necessary changes to the above noted documentation, and agree with the documentation, as recorded by the Physician extender.  Nena Alexander  MD [facility].

## 2013-09-10 ENCOUNTER — Encounter (HOSPITAL_COMMUNITY): Payer: Self-pay | Admitting: Radiology

## 2013-09-10 LAB — GLUCOSE, CAPILLARY: GLUCOSE-CAPILLARY: 133 mg/dL — AB (ref 70–99)

## 2013-09-10 MED ORDER — PANTOPRAZOLE SODIUM 40 MG IV SOLR
40.0000 mg | INTRAVENOUS | Status: DC
  Administered 2013-09-10 – 2013-09-11 (×2): 40 mg via INTRAVENOUS
  Filled 2013-09-10 (×3): qty 40

## 2013-09-10 NOTE — Progress Notes (Signed)
         PROGRESS NOTE  Steven [name] MRN:[id number] DOB: 04/08/1955 DOA: 09/02/2013 PCP: BADGER,MICHAEL C, MD  Interim summary 58-year-old male with a history of mental retardation, hyperlipidemia was taken to urgent care because of poor by mouth intake. Apparently the patient has lost a significant amount of weight over the last few weeks. The patient was discharged from [facility] on 08/14/2013 after an admission for sepsis and acute blood loss anemia secondary to NSAID-induced gastric and duodenal ulcerations. His colonoscopy was normal. He was discharged to the group home. He was readmitted due to 2 failure to thrive, dysphasia, and decreased oral intake. The patient was fluid resuscitated for his dehydration. 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 He underwent dilatation at that time.. The patient was seen by ENT was performed upper airway laryngoscopy. This was negative. Because of the patient's progressive dysphagia and reported lower extremity weakness, MRI of the brain has been planned. Because of the patient's contractures and agitation, this has been coordinated to be performed under conscious sedation after his endoscopy on 09/08/2013. The patient is to have repeat EGD with esophageal dilatation on 09/08/2013.  Assessment/Plan: Dehydration with hypernatremia  Due to little or no PO intake.  Resolved with IV hydration Now able to tolerate dys 3 diet s/p dilation  Esphagitis/Esophageal Stricture with dysphagia contributing to pt's dysphagia. ENT and GI consulted.  ENT--Dr. Newman performed laryngoscopy (8/22) which was negative for any abscess or obstruction  Dr. Newman also attempted to place NG tube laryngoscope, but met resistance  09/05/2013 EGD--severe esophageal stricture of lower third of the esophagus, grade D esophagitis, nonbleeding ulcers in the prepyloric and duodenal bulb  Esophageal dilation x 2 (on 8/23 and  8/26) Clinical swallow eval.  Recommends dysphagia 3-family (2 sisters) accepting risks of aspiration On BID PPI IV. Bx negative for H pylori.  NSAIDS stopped.  Patient tolerating fulls will advance to D3ith dinner (8/27)  Severe Malnutrition/failure to thrive.  Patient has lost 32% of his body weight in 5 weeks!  Appreciate nutrition consultation  Tolerating full liquids.  Advancing to D3 with ST's blessing.  Re-order ST if there are any difficulties. TSH 2.380  Serum B12--1109  A.m. 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 Dr Ghimire spoke patient's sister's at bedside-patient has not been ambulatory since around thanksgiving of last year.  Since then-he has had right ankle fracture that required surgery. Patient has had progressive problems with ambulation since then. PT for ROM  Received MRI brain under propofol.  Negative for acute etiology of contractures. Start low dose baclofen. Family desires [facility] at discharge.   Diarrhea  c-diff negative.  One liquid bowel movement (8/24).  Have requested RNs document BMs.  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  Patient currently  euvolemic - not volume overloaded.   Iron deficiency  Given a dose of IV Nulecit  difficult situation due to pt's chronic constipation to give po iron   Family Communication: family not present at bedside today, but they are aware of the plan. Disposition Plan: SNF at discharge.  Family prefers [facility] if possible.  Awaiting PASAR Level 2   Procedures/Studies:  Portable Chest 1 View  09/03/2013   CLINICAL DATA:  Evaluate for infiltrates  EXAM: PORTABLE CHEST - 1 VIEW  COMPARISON:  09/13/2013  FINDINGS: Suboptimal study due to patient rotation. There is no gross change in the   normal heart size and mild aortic tortuosity. There is no edema, consolidation, effusion, or pneumothorax.  IMPRESSION: No evidence of active cardiopulmonary disease.    Electronically Signed   By: Jonathan  Watts M.D.   On: 09/03/2013 01:05    Dg Abd Acute W/chest  08/26/2013   CLINICAL DATA:  Emesis.  Poorly eating.  EXAM: ACUTE ABDOMEN SERIES (ABDOMEN 2 VIEW & CHEST 1 VIEW)  COMPARISON:  Chest x-ray a 08/10/2013. Abdominal radiograph 08/08/2013.  FINDINGS: Lung volumes are low. Slight coarsening of interstitial markings, similar to prior examinations. No consolidative airspace disease. No pleural effusions. No pneumothorax. No pulmonary nodule or mass noted. Pulmonary vasculature and the cardiomediastinal silhouette are within normal limits.  Gas and stool are seen scattered throughout the colon extending to the level of the distal rectum. No pathologic distension of small bowel is noted. No gross evidence of pneumoperitoneum. Multiple nondilated gas-filled loops of small bowel throughout the central abdomen.  IMPRESSION: 1. Nonspecific, nonobstructive bowel gas pattern. 2. No pneumoperitoneum. 3. Low lung volumes without radiographic evidence of acute cardiopulmonary disease.   Electronically Signed   By: Daniel  Entrikin M.D.   On: 08/26/2013 18:58      Dg Naso G Tube Plc W/fl W/rad  09/04/2013   CLINICAL DATA:  Unable to pass nasogastric tube.  EXAM: NASO G TUBE PLACEMENT WITH FL AND WITH RAD  TECHNIQUE: Under direct fluoroscopic guidance the nasogastric tube was attempted to be passed into the stomach.  CONTRAST:  10 cc of thin barium  FLUOROSCOPY TIME:  5 min  COMPARISON:  None  FINDINGS: Initially the nasogastric tube was introduced through the left nostril and into the mid esophagus. The nasogastric tube was unable to be passed into the distal esophagus. A few cc of barium or injected into the nasogastric tube opacifying the mid and distal esophagus. Within the distal half of the esophagus there is a long segment of marked luminal narrowing. This appears to account for the inability to pass nasogastric tube into the stomach.  IMPRESSION: 1. Apparent long segment of  marked luminal narrowing involving the distal esophagus through which the nasogastric tube was unable to be passed. This may represent a benign or malignant stricture. Endoscopic evaluation is recommended.   Electronically Signed   By: Taylor  Stroud M.D.   On: 09/04/2013 16:21   Dg Ugi W/o Kub  09/03/2013   CLINICAL DATA:  Nausea, vomiting. History of gastric and duodenal ulcers.  EXAM: UPPER GI SERIES WITHOUT KUB  TECHNIQUE: Routine upper GI series was performed with high-density barium.  FLUOROSCOPY TIME:  1 min 47 seconds  COMPARISON:  08/26/2013 radiographs.  FINDINGS: The patient was either unable or would not drink contrast. The patient could not be positioned in the standard supine position. Despite the lateral positioning in the fetal position, I attempted to proceed with this study however the patient would not ingest contrast. In an attempt to stimulate swallowing, 2 mL of thick barium contrast was placed in the patient's mouth. Over the course of 1 min and 30 seconds, the patient did eventually swallow and this tiny bolus was observed to enter the esophagus. At this point, this study was deemed not feasible and terminated.  IMPRESSION: Aborted upper GI study.  The patient would not drink contrast.  I discussed the case with Dr. Stark. The patient had a recent endoscopy and the upper GI study is to ensure continued patency. The patient will require an enteric tube and then an upper GI and study   could be performed through the enteric tube. Given the patient's positioning, the upper GI study even with a tube will be limited but should be adequate to assess for gross patency of the upper GI tract.   Electronically Signed   By: Geoffrey  Lamke M.D.   On: 09/03/2013 15:04   Dg Esophagus Dilatation  09/05/2013   CLINICAL DATA: dysphagia   ESOPHAGEAL DILATATION  Fluoroscopy was provided for use by the requesting physician.  No images  were obtained for radiographic interpretation.      Subjective: Patient has severe MR and is unable to communicate his needs.  Objective: Filed Vitals:   09/09/13 0524 09/09/13 1406 09/09/13 2157 09/10/13 0532  BP: 138/84 127/60 128/80 131/84  Pulse: 72  86 89  Temp: 98.5 F (36.9 C) 98.7 F (37.1 C) 98.5 F (36.9 C) 98.4 F (36.9 C)  TempSrc: Oral Axillary Oral Oral  Resp: 20 20 18 18  Height:      Weight: 59.6 kg (131 lb 6.3 oz)   61.4 kg (135 lb 5.8 oz)  SpO2: 96% 96% 96% 95%    Intake/Output Summary (Last 24 hours) at 09/10/13 1312 Last data filed at 09/10/13 0936  Gross per 24 hour  Intake    435 ml  Output    600 ml  Net   -165 ml   Weight change: 1.8 kg (3 lb 15.5 oz)  Exam:   General:  Pt is alert, NAD, Contracted.  Repeats "bye" .  HEENT: No icterus, No thrush, [place]/AT  Cardiovascular: RRR, S1/S2, no rubs, no gallops  Respiratory: Poor inspiratory effort but clear to auscultation. No wheezing or rales  Abdomen: thin, Soft/+BS, epigastric tenderness to palpation., non distended, no guarding  Extremities: No edema,  No petechiae, No rashes, Arms and legs are contracted.  Skin:  Dry  Data Reviewed: Basic Metabolic Panel:  Recent Labs Lab 09/04/13 0756 09/05/13 0540 09/06/13 0525 09/09/13 1102  NA 145 143 135* 135*  K 3.8 3.8 3.8 4.0  CL 110 108 102 101  CO2 22 25 23 25  GLUCOSE 131* 148* 118* 154*  BUN 5* 3* 3* 4*  CREATININE 0.38* 0.34* 0.31* 0.38*  CALCIUM 9.0 9.0 8.7 9.3   Liver Function Tests: No results found for this basename: AST, ALT, ALKPHOS, BILITOT, PROT, ALBUMIN,  in the last 168 hours CBC:  Recent Labs Lab 09/04/13 0756  WBC 9.2  HGB 12.9*  HCT 40.8  MCV 90.7  PLT 335   Cardiac Enzymes:  Recent Labs Lab 09/04/13 0756  CKTOTAL 49    Recent Results (from the past 240 hour(s))  URINE CULTURE     Status: None   Collection Time    09/03/13  6:16 AM      Result Value Ref Range Status   Specimen Description URINE, CATHETERIZED   Final   Special Requests NONE    Final   Culture  Setup Time     Final   Value: 09/03/2013 14:28     Performed at [facility]   Colony Count     Final   Value: >=100,000 COLONIES/ML     Performed at [facility]   Culture     Final   Value: Multiple bacterial morphotypes present, none predominant. Suggest appropriate recollection if clinically indicated.     Performed at [facility]   Report Status 09/04/2013 FINAL   Final  STOOL CULTURE     Status: None   Collection Time      09/05/13  5:18 PM      Result Value Ref Range Status   Specimen Description STOOL   Final   Special Requests NONE   Final   Culture     Final   Value: NO SALMONELLA, SHIGELLA, CAMPYLOBACTER, YERSINIA, OR E.COLI 0157:H7 ISOLATED     Performed at [facility]   Report Status 09/09/2013 FINAL   Final  CLOSTRIDIUM DIFFICILE BY PCR     Status: None   Collection Time    09/05/13  5:18 PM      Result Value Ref Range Status   C difficile by pcr NEGATIVE  NEGATIVE Final     Scheduled Meds: . atorvastatin  10 mg Oral Daily  . baclofen  5 mg Oral TID  . cetaphil   Topical Daily  . enoxaparin (LOVENOX) injection  40 mg Subcutaneous Q24H  . feeding supplement (ENSURE COMPLETE)  237 mL Oral TID BM  . magic mouthwash w/lidocaine  10 mL Oral QID  . pantoprazole  40 mg Oral BID  . polyethylene glycol  17 g Oral BID   Continuous Infusions:   S Nonna Renninger  [facility] Pager [phone]  If 7PM-7AM, please contact night-coverage [email or web address] Password [email or web address] 09/10/2013, 1:12 PM   LOS: 8 days

## 2013-09-10 NOTE — Clinical Social Work Note (Signed)
Still awaiting PASARR # for SNF placement-  Hopeful to hear from [place] reviewer by Monday for determination. Will update family-  Eduard Clos, MSW, [place] [phone]

## 2013-09-11 NOTE — Progress Notes (Signed)
PROGRESS NOTE  Steven [name] DOB: 1955-04-04 DOA: 09/02/2013 PCP: [name], MD  Interim summary 58 year old male with a history of mental retardation, hyperlipidemia was taken to urgent care because of poor by mouth intake. Apparently the patient has lost a significant amount of weight over the last few weeks. The patient was discharged from [facility] on 08/14/2013 after an admission for sepsis and acute blood loss anemia secondary to NSAID-induced gastric and duodenal ulcerations. His colonoscopy was normal. He was discharged to the group home. He was readmitted due to 2 failure to thrive, dysphasia, and decreased oral intake. The patient was fluid resuscitated for his dehydration. 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 He underwent dilatation at that time.. The patient was seen by ENT was performed upper airway laryngoscopy. This was negative. Because of the patient's progressive dysphagia and reported lower extremity weakness, MRI of the brain has been planned. Because of the patient's contractures and agitation, this has been coordinated to be performed under conscious sedation after his endoscopy on 09/08/2013. The patient is to have repeat EGD with esophageal dilatation on 09/08/2013.  Assessment/Plan: Dehydration with hypernatremia  Due to little or no PO intake.  Resolved with IV hydration Now able to tolerate dys 3 diet s/p dilation  Esphagitis/Esophageal Stricture with dysphagia contributing to pt's dysphagia. ENT and GI consulted.  ENT--Dr. [name] performed laryngoscopy (8/22) which was negative for any abscess or obstruction  Dr. [name] also attempted to place NG tube laryngoscope, but met resistance  09/05/2013 EGD--severe esophageal stricture of lower third of the esophagus, grade D esophagitis, nonbleeding ulcers in the prepyloric and duodenal bulb  Esophageal dilation x 2 (on 8/23 and  8/26) Clinical swallow eval.  Recommends dysphagia 3-family (2 sisters) accepting risks of aspiration On BID PPI IV. Bx negative for H pylori.  NSAIDS stopped.  Patient tolerating fulls will advance to D3ith dinner (8/27)  Severe Malnutrition/failure to thrive.  Patient has lost 32% of his body weight in 5 weeks!  Appreciate nutrition consultation  Tolerating full liquids.  Advancing to D3 with ST's blessing.  Re-order ST if there are any difficulties. TSH 2.380  Serum B12--1109  A.m. 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 Dr [name] spoke patient's sister's at bedside-patient has not been ambulatory since around thanksgiving of last year.  Since then-he has had right ankle fracture that required surgery. Patient has had progressive problems with ambulation since then. PT for ROM  Received MRI brain under propofol.  Negative for acute etiology of contractures. Start low dose baclofen. Family desires [facility] at discharge.-awaiting SNF placement  Diarrhea  c-diff negative.  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  Patient currently  euvolemic - not volume overloaded.   Iron deficiency  Given a dose of IV Nulecit  difficult situation due to pt's chronic constipation to give po iron   Family Communication: family not present at bedside today, but they are aware of the plan. Disposition Plan: SNF at discharge.  Family prefers [facility] if possible.  Awaiting PASAR Level 2   Procedures/Studies:  Portable Chest 1 View  09-24-2013   CLINICAL DATA:  Evaluate for infiltrates  EXAM: PORTABLE CHEST - 1 VIEW  COMPARISON:  09/13/2013  FINDINGS: Suboptimal study due to patient rotation. There is no gross change in the normal heart size and mild aortic tortuosity. There is  no edema, consolidation, effusion, or pneumothorax.  IMPRESSION: No evidence of active cardiopulmonary disease.   Electronically Signed   By: Jorje Guild M.D.    On: 09/03/2013 01:05    Dg Abd Acute W/chest  08/26/2013   CLINICAL DATA:  Emesis.  Poorly eating.  EXAM: ACUTE ABDOMEN SERIES (ABDOMEN 2 VIEW & CHEST 1 VIEW)  COMPARISON:  Chest x-ray a 08/10/2013. Abdominal radiograph 08/08/2013.  FINDINGS: Lung volumes are low. Slight coarsening of interstitial markings, similar to prior examinations. No consolidative airspace disease. No pleural effusions. No pneumothorax. No pulmonary nodule or mass noted. Pulmonary vasculature and the cardiomediastinal silhouette are within normal limits.  Gas and stool are seen scattered throughout the colon extending to the level of the distal rectum. No pathologic distension of small bowel is noted. No gross evidence of pneumoperitoneum. Multiple nondilated gas-filled loops of small bowel throughout the central abdomen.  IMPRESSION: 1. Nonspecific, nonobstructive bowel gas pattern. 2. No pneumoperitoneum. 3. Low lung volumes without radiographic evidence of acute cardiopulmonary disease.   Electronically Signed   By: Vinnie Langton M.D.   On: 08/26/2013 18:58      Dg [place] Dys Tube Plc W/fl W/rad  09/04/2013   CLINICAL DATA:  Unable to pass nasogastric tube.  EXAM: NASO G TUBE PLACEMENT WITH FL AND WITH RAD  TECHNIQUE: Under direct fluoroscopic guidance the nasogastric tube was attempted to be passed into the stomach.  CONTRAST:  10 cc of thin barium  FLUOROSCOPY TIME:  5 min  COMPARISON:  None  FINDINGS: Initially the nasogastric tube was introduced through the left nostril and into the mid esophagus. The nasogastric tube was unable to be passed into the distal esophagus. A few cc of barium or injected into the nasogastric tube opacifying the mid and distal esophagus. Within the distal half of the esophagus there is a long segment of marked luminal narrowing. This appears to account for the inability to pass nasogastric tube into the stomach.  IMPRESSION: 1. Apparent long segment of marked luminal narrowing involving the distal  esophagus through which the nasogastric tube was unable to be passed. This may represent a benign or malignant stricture. Endoscopic evaluation is recommended.   Electronically Signed   By: Kerby Moors M.D.   On: 09/04/2013 16:21   Dg Ugi W/o Kub  09/03/2013   CLINICAL DATA:  Nausea, vomiting. History of gastric and duodenal ulcers.  EXAM: UPPER GI SERIES WITHOUT KUB  TECHNIQUE: Routine upper GI series was performed with high-density barium.  FLUOROSCOPY TIME:  1 min 47 seconds  COMPARISON:  08/26/2013 radiographs.  FINDINGS: The patient was either unable or would not drink contrast. The patient could not be positioned in the standard supine position. Despite the lateral positioning in the fetal position, I attempted to proceed with this study however the patient would not ingest contrast. In an attempt to stimulate swallowing, 2 mL of thick barium contrast was placed in the patient's mouth. Over the course of 1 min and 30 seconds, the patient did eventually swallow and this tiny bolus was observed to enter the esophagus. At this point, this study was deemed not feasible and terminated.  IMPRESSION: Aborted upper GI study.  The patient would not drink contrast.  I discussed the case with Dr. Fuller Plan. The patient had a recent endoscopy and the upper GI study is to ensure continued patency. The patient will require an enteric tube and then an upper GI and study could be performed through the enteric tube. Given the  patient's positioning, the upper GI study even with a tube will be limited but should be adequate to assess for gross patency of the upper GI tract.   Electronically Signed   By: Dereck Ligas M.D.   On: 09/03/2013 15:04   Dg Esophagus Dilatation  09/05/2013   CLINICAL DATA: dysphagia   ESOPHAGEAL DILATATION  Fluoroscopy was provided for use by the requesting physician.  No images  were obtained for radiographic interpretation.     Subjective: Patient has severe MR and is unable to communicate  his needs.  Objective: Filed Vitals:   09/10/13 0532 09/10/13 1544 09/10/13 2113 09/11/13 0514  BP: 131/84 131/68 121/68 137/88  Pulse: 89 96 89 90  Temp: 98.4 F (36.9 C) 98.8 F (37.1 C) 98.7 F (37.1 C) 97.5 F (36.4 C)  TempSrc: Oral Oral Oral Oral  Resp: $Remo'18 16 16 16  'HEYXl$ Height:      Weight: 61.4 kg (135 lb 5.8 oz)   58.8 kg (129 lb 10.1 oz)  SpO2: 95% 97% 96% 96%    Intake/Output Summary (Last 24 hours) at 09/11/13 1053 Last data filed at 09/11/13 0847  Gross per 24 hour  Intake    170 ml  Output      0 ml  Net    170 ml   Weight change: -2.6 kg (-5 lb 11.7 oz)  Exam:   General:  Pt is alert, NAD, Contracted.    HEENT: No icterus, No thrush, [place]/AT  Cardiovascular: RRR, S1/S2, no rubs, no gallops  Respiratory: Poor inspiratory effort but clear to auscultation. No wheezing or rales  Abdomen: thin, Soft/+BS, epigastric tenderness to palpation., non distended, no guarding  Extremities: No edema,  No petechiae, No rashes, Arms and legs are contracted.  Skin:  Dry  Data Reviewed: Basic Metabolic Panel:  Recent Labs Lab 09/05/13 0540 09/06/13 0525 09/09/13 1102  NA 143 135* 135*  K 3.8 3.8 4.0  CL 108 102 101  CO2 $Re'25 23 25  'Aas$ GLUCOSE 148* 118* 154*  BUN 3* 3* 4*  CREATININE 0.34* 0.31* 0.38*  CALCIUM 9.0 8.7 9.3   Liver Function Tests: No results found for this basename: AST, ALT, ALKPHOS, BILITOT, PROT, ALBUMIN,  in the last 168 hours CBC: No results found for this basename: WBC, NEUTROABS, HGB, HCT, MCV, PLT,  in the last 168 hours Cardiac Enzymes: No results found for this basename: CKTOTAL, CKMB, CKMBINDEX, TROPONINI,  in the last 168 hours  Recent Results (from the past 240 hour(s))  URINE CULTURE     Status: None   Collection Time    09/03/13  6:16 AM      Result Value Ref Range Status   Specimen Description URINE, CATHETERIZED   Final   Special Requests NONE   Final   Culture  Setup Time     Final   Value: 09/03/2013 14:28     Performed at  [place]     Final   Value: >=100,000 COLONIES/ML     Performed at [facility]   Culture     Final   Value: Multiple bacterial morphotypes present, none predominant. Suggest appropriate recollection if clinically indicated.     Performed at [facility]   Report Status 09/04/2013 FINAL   Final  STOOL CULTURE     Status: None   Collection Time    09/05/13  5:18 PM      Result Value Ref Range Status   Specimen Description  STOOL   Final   Special Requests NONE   Final   Culture     Final   Value: NO SALMONELLA, SHIGELLA, CAMPYLOBACTER, YERSINIA, OR E.COLI 0157:H7 ISOLATED     Performed at [facility]   Report Status 09/09/2013 FINAL   Final  CLOSTRIDIUM DIFFICILE BY PCR     Status: None   Collection Time    09/05/13  5:18 PM      Result Value Ref Range Status   C difficile by pcr NEGATIVE  NEGATIVE Final     Scheduled Meds: . atorvastatin  10 mg Oral Daily  . baclofen  5 mg Oral TID  . cetaphil   Topical Daily  . enoxaparin (LOVENOX) injection  40 mg Subcutaneous Q24H  . feeding supplement (ENSURE COMPLETE)  237 mL Oral TID BM  . magic mouthwash w/lidocaine  10 mL Oral QID  . pantoprazole (PROTONIX) IV  40 mg Intravenous Q24H  . polyethylene glycol  17 g Oral BID   Continuous Infusions:   S Macy Lingenfelter  [facility] Pager [phone]  If 7PM-7AM, please contact night-coverage [email or web address] Password [email or web address] 09/11/2013, 10:53 AM   LOS: 9 days

## 2013-09-12 MED ORDER — PANTOPRAZOLE SODIUM 40 MG PO TBEC
40.0000 mg | DELAYED_RELEASE_TABLET | Freq: Every day | ORAL | Status: DC
  Administered 2013-09-12: 40 mg via ORAL
  Filled 2013-09-12: qty 1

## 2013-09-12 NOTE — Progress Notes (Signed)
PROGRESS NOTE  Steven [name] HKV:[id number] DOB: [birth year] DOA: 09/02/2013 PCP: [name], MD  Interim summary 58 year old male with a history of mental retardation, hyperlipidemia was taken to urgent care because of poor by mouth intake. Apparently the patient has lost a significant amount of weight over the last few weeks. The patient was discharged from [facility] on 08/14/2013 after an admission for sepsis and acute blood loss anemia secondary to NSAID-induced gastric and duodenal ulcerations. His colonoscopy was normal. He was discharged to the group home. He was readmitted due to 2 failure to thrive, dysphasia, and decreased oral intake. The patient was fluid resuscitated for his dehydration. 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 He underwent dilatation at that time.. The patient was seen by ENT was performed upper airway laryngoscopy. This was negative. Because of the patient's progressive dysphagia and reported lower extremity weakness, MRI of the brain has been planned. Because of the patient's contractures and agitation, this has been coordinated to be performed under conscious sedation after his endoscopy on 09/08/2013. The patient is to have repeat EGD with esophageal dilatation on 09/08/2013.Now toleraing Dys 3 diet, and awaiting SNF placement.   Assessment/Plan: Dehydration with hypernatremia  Due to little or no PO intake.  Resolved with IV hydration Now able to tolerate dys 3 diet s/p dilation  Esphagitis/Esophageal Stricture with dysphagia contributing to pt's dysphagia. ENT and GI consulted.  ENT--Dr. [name] performed laryngoscopy (8/22) which was negative for any abscess or obstruction  Dr. [name] also attempted to place NG tube laryngoscope, but met resistance  09/05/2013 EGD--severe esophageal stricture of lower third of the esophagus, grade D esophagitis, nonbleeding ulcers in the prepyloric and duodenal  bulb  Esophageal dilation x 2 (on 8/23 and 8/26) Clinical swallow eval.  Recommends dysphagia 3-family (2 sisters) accepting risks of aspiration On BID PPI IV. Bx negative for H pylori.  NSAIDS stopped.  Patient tolerating fulls will advance to D3ith dinner (8/27)  Severe Malnutrition/failure to thrive.  Patient has lost 32% of his body weight in 5 weeks!  Appreciate nutrition consultation  Tolerating full liquids.  Advancing to D3 with ST's blessing.  Re-order ST if there are any difficulties. TSH 2.380  Serum B12--1109  A.m. 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 Dr [name] spoke patient's sister's at bedside-patient has not been ambulatory since around thanksgiving of last year.  Since then-he has had right ankle fracture that required surgery. Patient has had progressive problems with ambulation since then. PT for ROM  Received MRI brain under propofol.  Negative for acute etiology of contractures. Start low dose baclofen. Family desires [facility] at discharge.-awaiting SNF placement  Diarrhea  c-diff negative.  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  Patient currently  euvolemic - not volume overloaded.   Iron deficiency  Given a dose of IV Nulecit  difficult situation due to pt's chronic constipation to give po iron   Family Communication: family not present at bedside today, but they are aware of the plan. Disposition Plan: SNF at discharge.  Family prefers [facility] if possible.  Awaiting PASAR Level 2   Procedures/Studies:  Portable Chest 1 View  09/07/2013   CLINICAL DATA:  Evaluate for infiltrates  EXAM: PORTABLE CHEST - 1 VIEW  COMPARISON:  09/13/2013  FINDINGS: Suboptimal study due to patient rotation. There is no gross change in the  normal heart size and mild aortic tortuosity. There is no edema, consolidation, effusion, or pneumothorax.  IMPRESSION: No evidence of active cardiopulmonary disease.    Electronically Signed   By: Jonathan  Watts M.D.   On: 09/03/2013 01:05    Dg Abd Acute W/chest  08/26/2013   CLINICAL DATA:  Emesis.  Poorly eating.  EXAM: ACUTE ABDOMEN SERIES (ABDOMEN 2 VIEW & CHEST 1 VIEW)  COMPARISON:  Chest x-ray a 08/10/2013. Abdominal radiograph 08/08/2013.  FINDINGS: Lung volumes are low. Slight coarsening of interstitial markings, similar to prior examinations. No consolidative airspace disease. No pleural effusions. No pneumothorax. No pulmonary nodule or mass noted. Pulmonary vasculature and the cardiomediastinal silhouette are within normal limits.  Gas and stool are seen scattered throughout the colon extending to the level of the distal rectum. No pathologic distension of small bowel is noted. No gross evidence of pneumoperitoneum. Multiple nondilated gas-filled loops of small bowel throughout the central abdomen.  IMPRESSION: 1. Nonspecific, nonobstructive bowel gas pattern. 2. No pneumoperitoneum. 3. Low lung volumes without radiographic evidence of acute cardiopulmonary disease.   Electronically Signed   By: Daniel  Entrikin M.D.   On: 08/26/2013 18:58      Dg Naso G Tube Plc W/fl W/rad  09/04/2013   CLINICAL DATA:  Unable to pass nasogastric tube.  EXAM: NASO G TUBE PLACEMENT WITH FL AND WITH RAD  TECHNIQUE: Under direct fluoroscopic guidance the nasogastric tube was attempted to be passed into the stomach.  CONTRAST:  10 cc of thin barium  FLUOROSCOPY TIME:  5 min  COMPARISON:  None  FINDINGS: Initially the nasogastric tube was introduced through the left nostril and into the mid esophagus. The nasogastric tube was unable to be passed into the distal esophagus. A few cc of barium or injected into the nasogastric tube opacifying the mid and distal esophagus. Within the distal half of the esophagus there is a long segment of marked luminal narrowing. This appears to account for the inability to pass nasogastric tube into the stomach.  IMPRESSION: 1. Apparent long segment of  marked luminal narrowing involving the distal esophagus through which the nasogastric tube was unable to be passed. This may represent a benign or malignant stricture. Endoscopic evaluation is recommended.   Electronically Signed   By: Taylor  Stroud M.D.   On: 09/04/2013 16:21   Dg Ugi W/o Kub  09/03/2013   CLINICAL DATA:  Nausea, vomiting. History of gastric and duodenal ulcers.  EXAM: UPPER GI SERIES WITHOUT KUB  TECHNIQUE: Routine upper GI series was performed with high-density barium.  FLUOROSCOPY TIME:  1 min 47 seconds  COMPARISON:  08/26/2013 radiographs.  FINDINGS: The patient was either unable or would not drink contrast. The patient could not be positioned in the standard supine position. Despite the lateral positioning in the fetal position, I attempted to proceed with this study however the patient would not ingest contrast. In an attempt to stimulate swallowing, 2 mL of thick barium contrast was placed in the patient's mouth. Over the course of 1 min and 30 seconds, the patient did eventually swallow and this tiny bolus was observed to enter the esophagus. At this point, this study was deemed not feasible and terminated.  IMPRESSION: Aborted upper GI study.  The patient would not drink contrast.  I discussed the case with Dr. Stark. The patient had a recent endoscopy and the upper GI study is to ensure continued patency. The patient will require an enteric tube and then an upper GI and study   could be performed through the enteric tube. Given the patient's positioning, the upper GI study even with a tube will be limited but should be adequate to assess for gross patency of the upper GI tract.   Electronically Signed   By: Dereck Ligas M.D.   On: 09/03/2013 15:04   Dg Esophagus Dilatation  09/05/2013   CLINICAL DATA: dysphagia   ESOPHAGEAL DILATATION  Fluoroscopy was provided for use by the requesting physician.  No images  were obtained for radiographic interpretation.      Subjective: Patient has severe MR and is unable to communicate his needs.  Objective: Filed Vitals:   09/11/13 0514 09/11/13 1430 09/11/13 2134 09/12/13 0627  BP: 137/88 120/81 134/80 130/80  Pulse: 90 90 89 74  Temp: 97.5 F (36.4 C) 98.1 F (36.7 C) 98.5 F (36.9 C) 98.2 F (36.8 C)  TempSrc: Oral Axillary Axillary Axillary  Resp: $Remo'16  16 16  'jEwnh$ Height:      Weight: 58.8 kg (129 lb 10.1 oz)   59.7 kg (131 lb 9.8 oz)  SpO2: 96% 98% 98% 98%    Intake/Output Summary (Last 24 hours) at 09/12/13 1016 Last data filed at 09/12/13 0850  Gross per 24 hour  Intake    680 ml  Output   1000 ml  Net   -320 ml   Weight change: 0.9 kg (1 lb 15.7 oz)  Exam:   General:  Pt is alert, NAD, Contracted.  Says "boo" and "bye"  HEENT: No icterus, No thrush, [place]/AT  Cardiovascular: RRR, S1/S2, no rubs, no gallops  Respiratory: Poor inspiratory effort but clear to auscultation. No wheezing or rales  Abdomen: thin, Soft/+BS, epigastric tenderness to palpation., non distended, no guarding  Extremities: No edema,  No petechiae, No rashes, Arms and legs are contracted.  Skin:  Dry  Data Reviewed: Basic Metabolic Panel:  Recent Labs Lab 09/06/13 0525 09/09/13 1102  NA 135* 135*  K 3.8 4.0  CL 102 101  CO2 23 25  GLUCOSE 118* 154*  BUN 3* 4*  CREATININE 0.31* 0.38*  CALCIUM 8.7 9.3   Liver Function Tests: No results found for this basename: AST, ALT, ALKPHOS, BILITOT, PROT, ALBUMIN,  in the last 168 hours CBC: No results found for this basename: WBC, NEUTROABS, HGB, HCT, MCV, PLT,  in the last 168 hours Cardiac Enzymes: No results found for this basename: CKTOTAL, CKMB, CKMBINDEX, TROPONINI,  in the last 168 hours  Recent Results (from the past 240 hour(s))  URINE CULTURE     Status: None   Collection Time    09/03/13  6:16 AM      Result Value Ref Range Status   Specimen Description URINE, CATHETERIZED   Final   Special Requests NONE   Final   Culture  Setup Time      Final   Value: 09/03/2013 14:28     Performed at [place]     Final   Value: >=100,000 COLONIES/ML     Performed at [facility]   Culture     Final   Value: Multiple bacterial morphotypes present, none predominant. Suggest appropriate recollection if clinically indicated.     Performed at [facility]   Report Status 09/04/2013 FINAL   Final  STOOL CULTURE     Status: None   Collection Time    09/05/13  5:18 PM      Result Value Ref Range Status   Specimen Description STOOL  Final   Special Requests NONE   Final   Culture     Final   Value: NO SALMONELLA, SHIGELLA, CAMPYLOBACTER, YERSINIA, OR E.COLI [id number] ISOLATED     Performed at [facility]   Report Status 09/09/2013 FINAL   Final  CLOSTRIDIUM DIFFICILE BY PCR     Status: None   Collection Time    09/05/13  5:18 PM      Result Value Ref Range Status   C difficile by pcr NEGATIVE  NEGATIVE Final     Scheduled Meds: . atorvastatin  10 mg Oral Daily  . baclofen  5 mg Oral TID  . cetaphil   Topical Daily  . enoxaparin (LOVENOX) injection  40 mg Subcutaneous Q24H  . feeding supplement (ENSURE COMPLETE)  237 mL Oral TID BM  . magic mouthwash w/lidocaine  10 mL Oral QID  . pantoprazole  40 mg Oral QHS  . polyethylene glycol  17 g Oral BID   Continuous Infusions:   S Biff Rutigliano  [facility] Pager [phone]  If 7PM-7AM, please contact night-coverage [email or web address] Password [email or web address] 09/12/2013, 10:16 AM   LOS: 10 days

## 2013-09-13 MED ORDER — BACLOFEN 5 MG HALF TABLET: 5.0000 mg | ORAL_TABLET | Freq: Three times a day (TID) | ORAL | Status: AC

## 2013-09-13 MED ORDER — POLYETHYLENE GLYCOL 3350 17 G PO PACK: 17.0000 g | PACK | Freq: Every day | ORAL | Status: AC

## 2013-09-13 MED ORDER — PANTOPRAZOLE SODIUM 40 MG PO TBEC: 40.0000 mg | DELAYED_RELEASE_TABLET | Freq: Every day | ORAL | Status: AC

## 2013-09-13 MED ORDER — SODIUM CHLORIDE 0.9 % IV SOLN
INTRAVENOUS | Status: DC
Start: 2013-09-13 — End: 2013-09-13

## 2013-09-13 MED ORDER — ENSURE COMPLETE PO LIQD: 237.0000 mL | Freq: Three times a day (TID) | ORAL | Status: AC

## 2013-09-13 NOTE — Clinical Social Work Placement (Signed)
Clinical Social Work Department CLINICAL SOCIAL WORK PLACEMENT NOTE 09/13/2013  Patient:  Steven [name], Steven [name]  Account Number:  [id number] Admit date:  09/02/2013  Clinical Social Worker:  DONNA CROWDER, LCSWA  Date/time:     Clinical Social Work is seeking post-discharge placement for this patient at the following level of care:   SKILLED NURSING   (*CSW will update this form in Epic as items are completed)     Patient/family provided with [place] Department of Clinical Social Work's list of facilities offering this level of care within the geographic area requested by the patient (or if unable, by the patient's family).  09/08/2013  Patient/family informed of their freedom to choose among providers that offer the needed level of care, that participate in Medicare, Medicaid or managed care program needed by the patient, have an available bed and are willing to accept the patient.    Patient/family informed of [facility]' ownership interest in [facility], as well as of the fact that they are under no obligation to receive care at this facility.  PASARR submitted to [employer] on 09/08/2013 PASARR number received on   FL2 transmitted to all facilities in geographic area requested by pt/family on  09/08/2013 FL2 transmitted to all facilities within larger geographic area on   Patient informed that his/her managed care company has contracts with or will negotiate with  certain facilities, including the following:   NA  Faxed to [facility] and to [facility]     Patient/family informed of bed offers received:  09/13/2013 Patient chooses bed at [facility] Physician recommends and patient chooses bed at    Patient to be transferred to [facility] on  09/13/2013 Patient to be transferred to facility by [facility]  Corey Harold) Patient and family notified of transfer on 09/13/2013 Name of family member notified:  [name]  The following physician  request were entered in Epic: Physician Request  Please prepare priority discharge summary and prescriptions.  Please sign FL2.    Additional Comments:  Per MD patient ready for DC to [place], patient, patient's family, and facility notified of DC. RN given number for report. DC packet on chart. [facility] transport requested for patient. Group home will pick up wheel chair. CSW signing off.    Liz Beach MSW, [place], [place], [phone]

## 2013-09-13 NOTE — Progress Notes (Signed)
Pt prepared for d/c to SNF. IV d/c'd. Skin intact except as most recently charted. Vitals are stable. Report called to receiving facility. Pt to be transported by ambulance service. 

## 2013-11-02 ENCOUNTER — Encounter: Payer: Self-pay | Admitting: Gastroenterology

## 2013-11-10 ENCOUNTER — Ambulatory Visit: Payer: Medicare Other | Admitting: Gastroenterology

## 2014-04-20 ENCOUNTER — Encounter: Payer: Self-pay | Admitting: Gastroenterology

## 2015-06-15 DEATH — deceased

## 2016-02-04 IMAGING — CR DG CHEST 1V PORT
1 series · 1 of 1 positions shown · non-contrast
Comparison: Chest radiograph performed 08/07/2013

CLINICAL DATA: Central line placement.

EXAM:
PORTABLE CHEST - 1 VIEW

[AP]
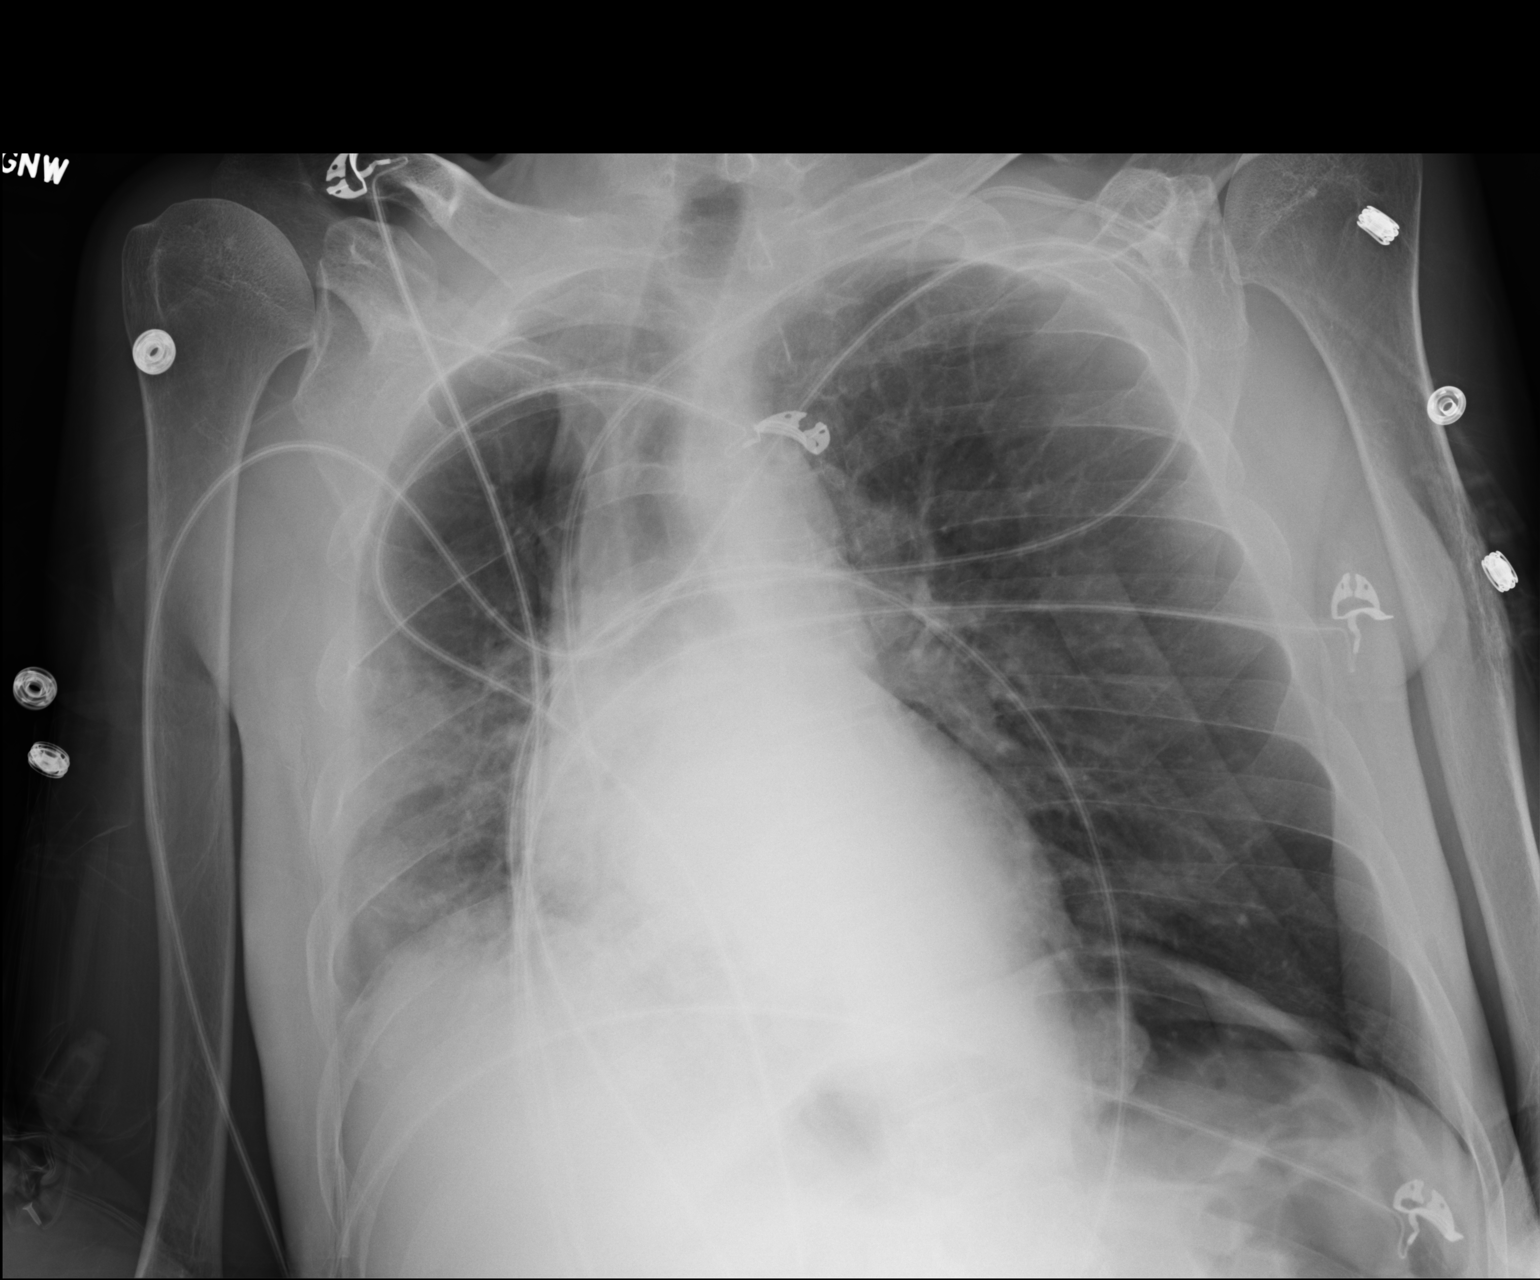

[1 of 1 positions shown; findings below may reference images not displayed]

FINDINGS: The patient's left subclavian line is seen ending about the
cavoatrial junction.

Lung expansion is improved, though evaluation is suboptimal due to
patient rotation. Patchy right-sided airspace opacities raise
concern for pneumonia, perhaps slightly more confluent than on the
prior study. The left lung appears clear. No pleural effusion or
pneumothorax is seen.

The cardiomediastinal silhouette is normal in size. No acute osseous
abnormalities are identified.
IMPRESSION: 1. Left subclavian line seen ending about the cavoatrial junction.
2. Patchy right-sided airspace opacities raise concern for
pneumonia, perhaps slightly more confluent than on the prior study.

## 2016-02-06 IMAGING — CR DG CHEST 1V PORT
1 series · 1 of 1 positions shown · non-contrast
Comparison: 08/09/2013

CLINICAL DATA: Dyspnea

EXAM:
PORTABLE CHEST - 1 VIEW

[AP]
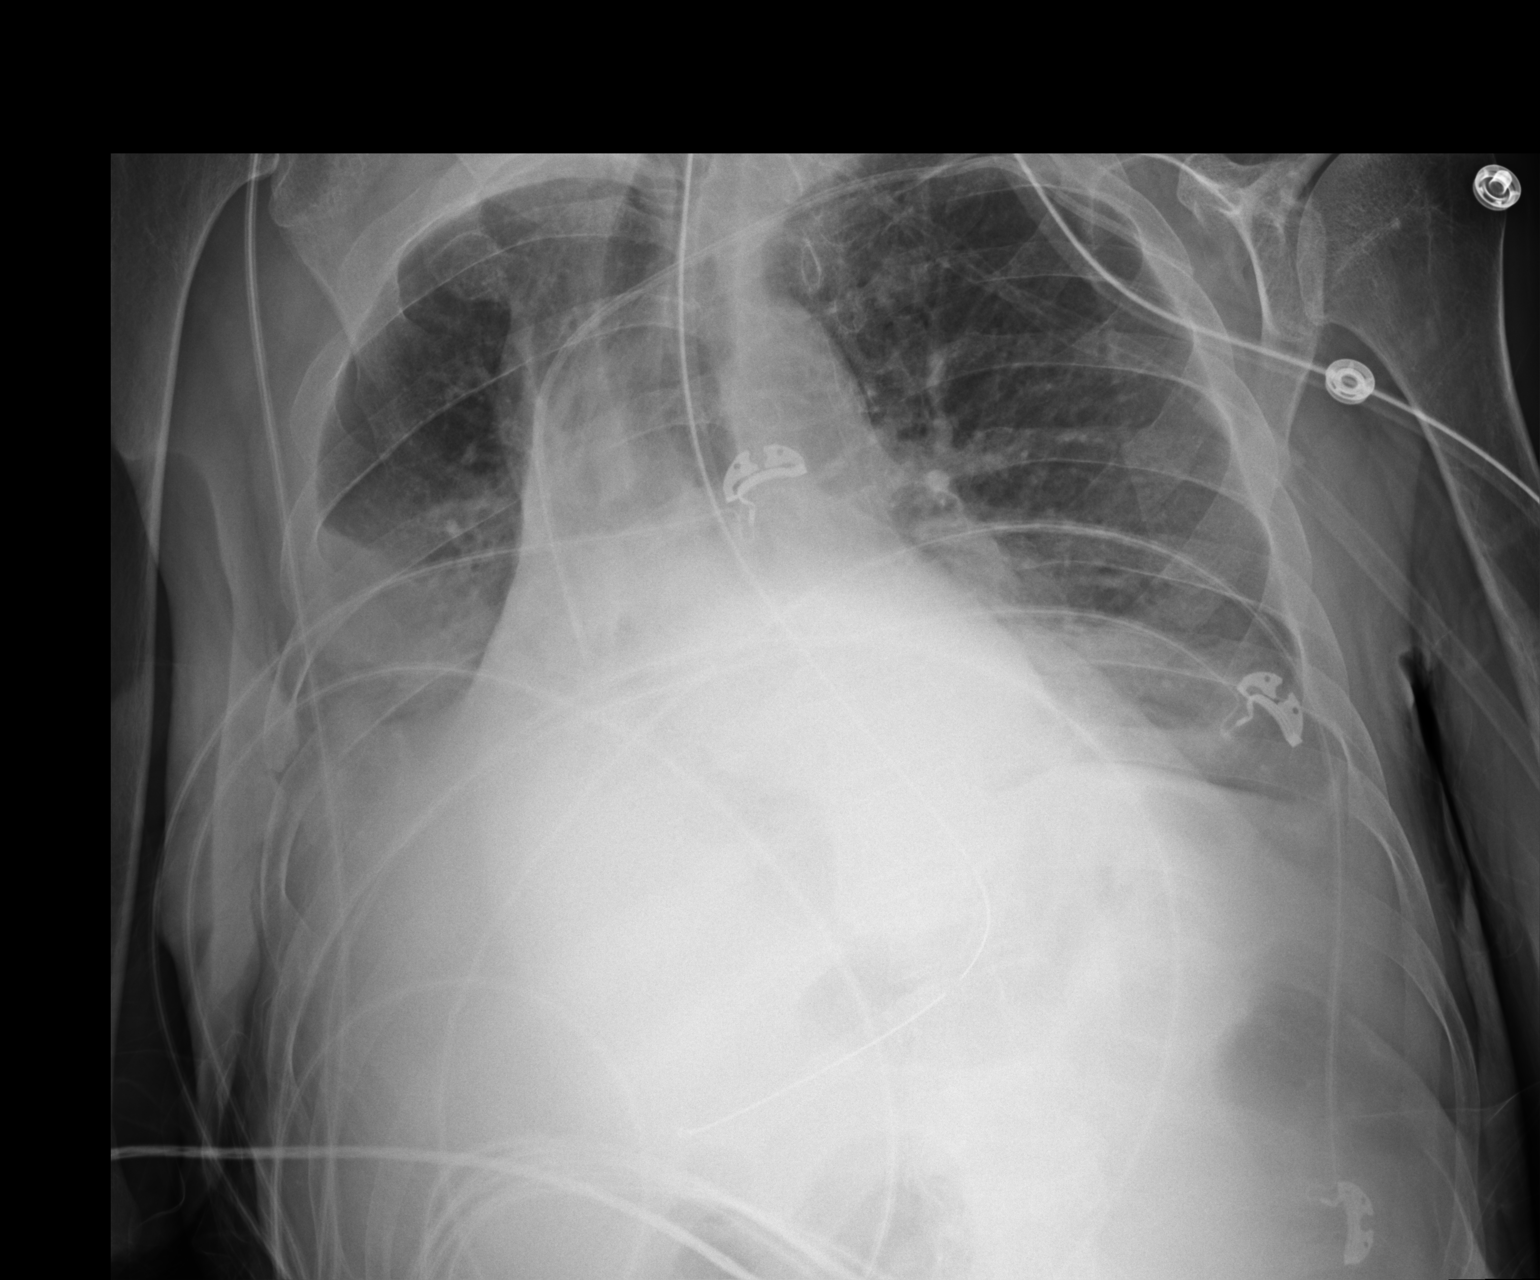

[1 of 1 positions shown; findings below may reference images not displayed]

FINDINGS: A left-sided central venous line and nasogastric catheter are again
noted in satisfactory position. The cardiac shadow remains enlarged.
Patient is significantly rotated to the right accentuating the
mediastinal markings. Bibasilar infiltrative changes are again seen
slightly worse on the right than the left. No acute bony abnormality
is noted.
IMPRESSION: Persistent bibasilar changes.

## 2016-02-09 IMAGING — CR DG ANKLE 2V *L*
3 series · 3 of 3 positions shown · non-contrast
Comparison: None

CLINICAL DATA: Left ankle pain

EXAM:
LEFT ANKLE - 2 VIEW

[view not recorded (1 of 3)]
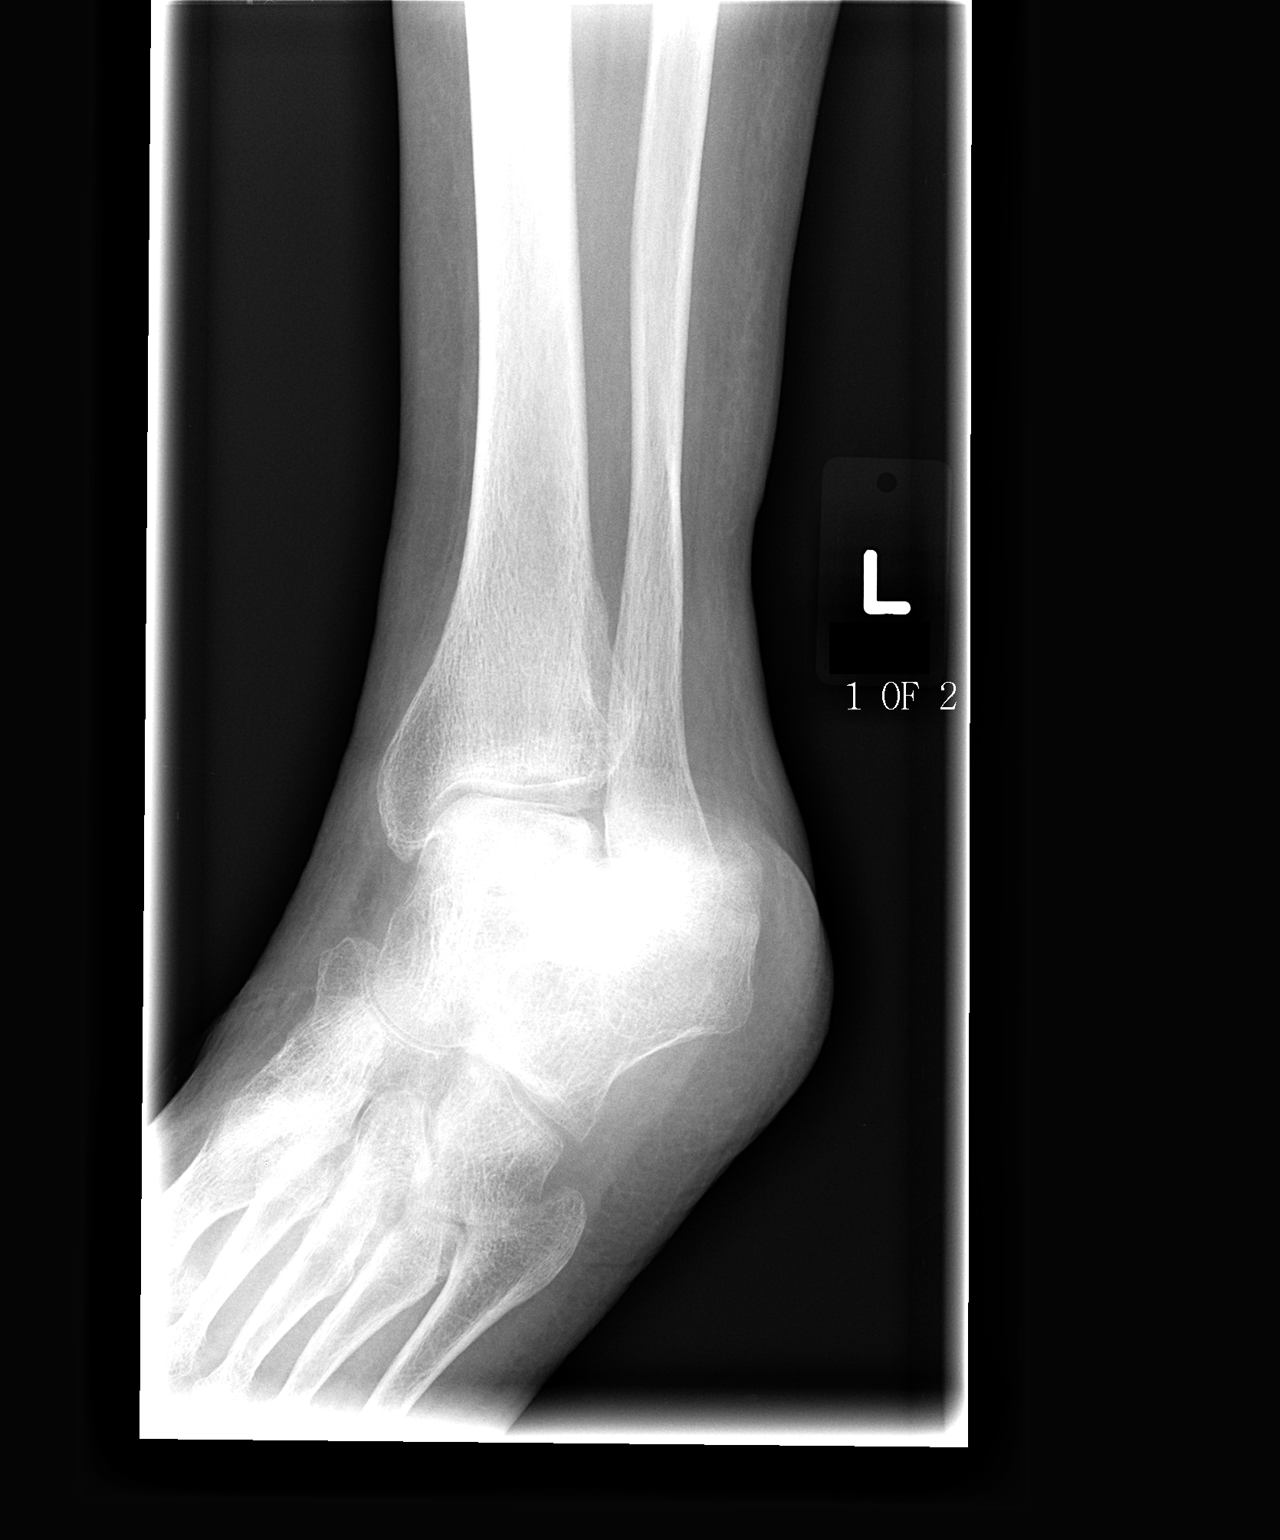

[view not recorded (2 of 3)]
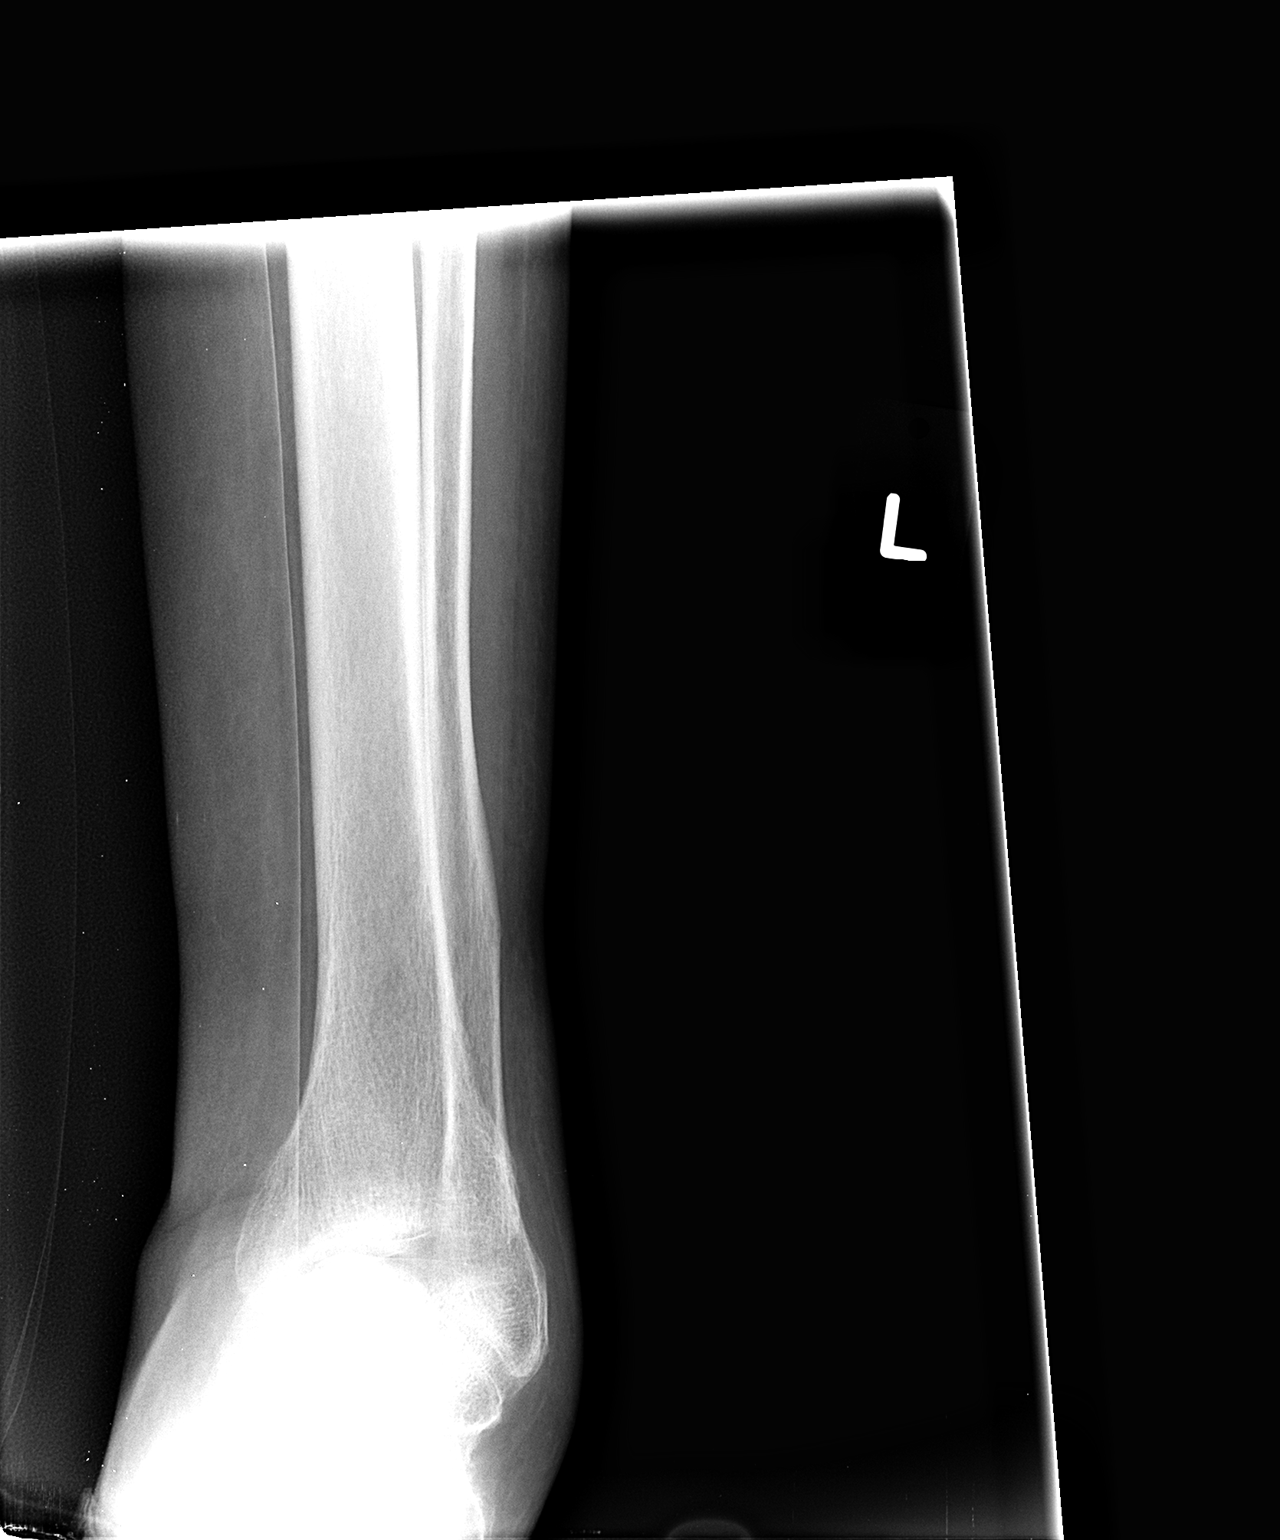

[view not recorded (3 of 3)]
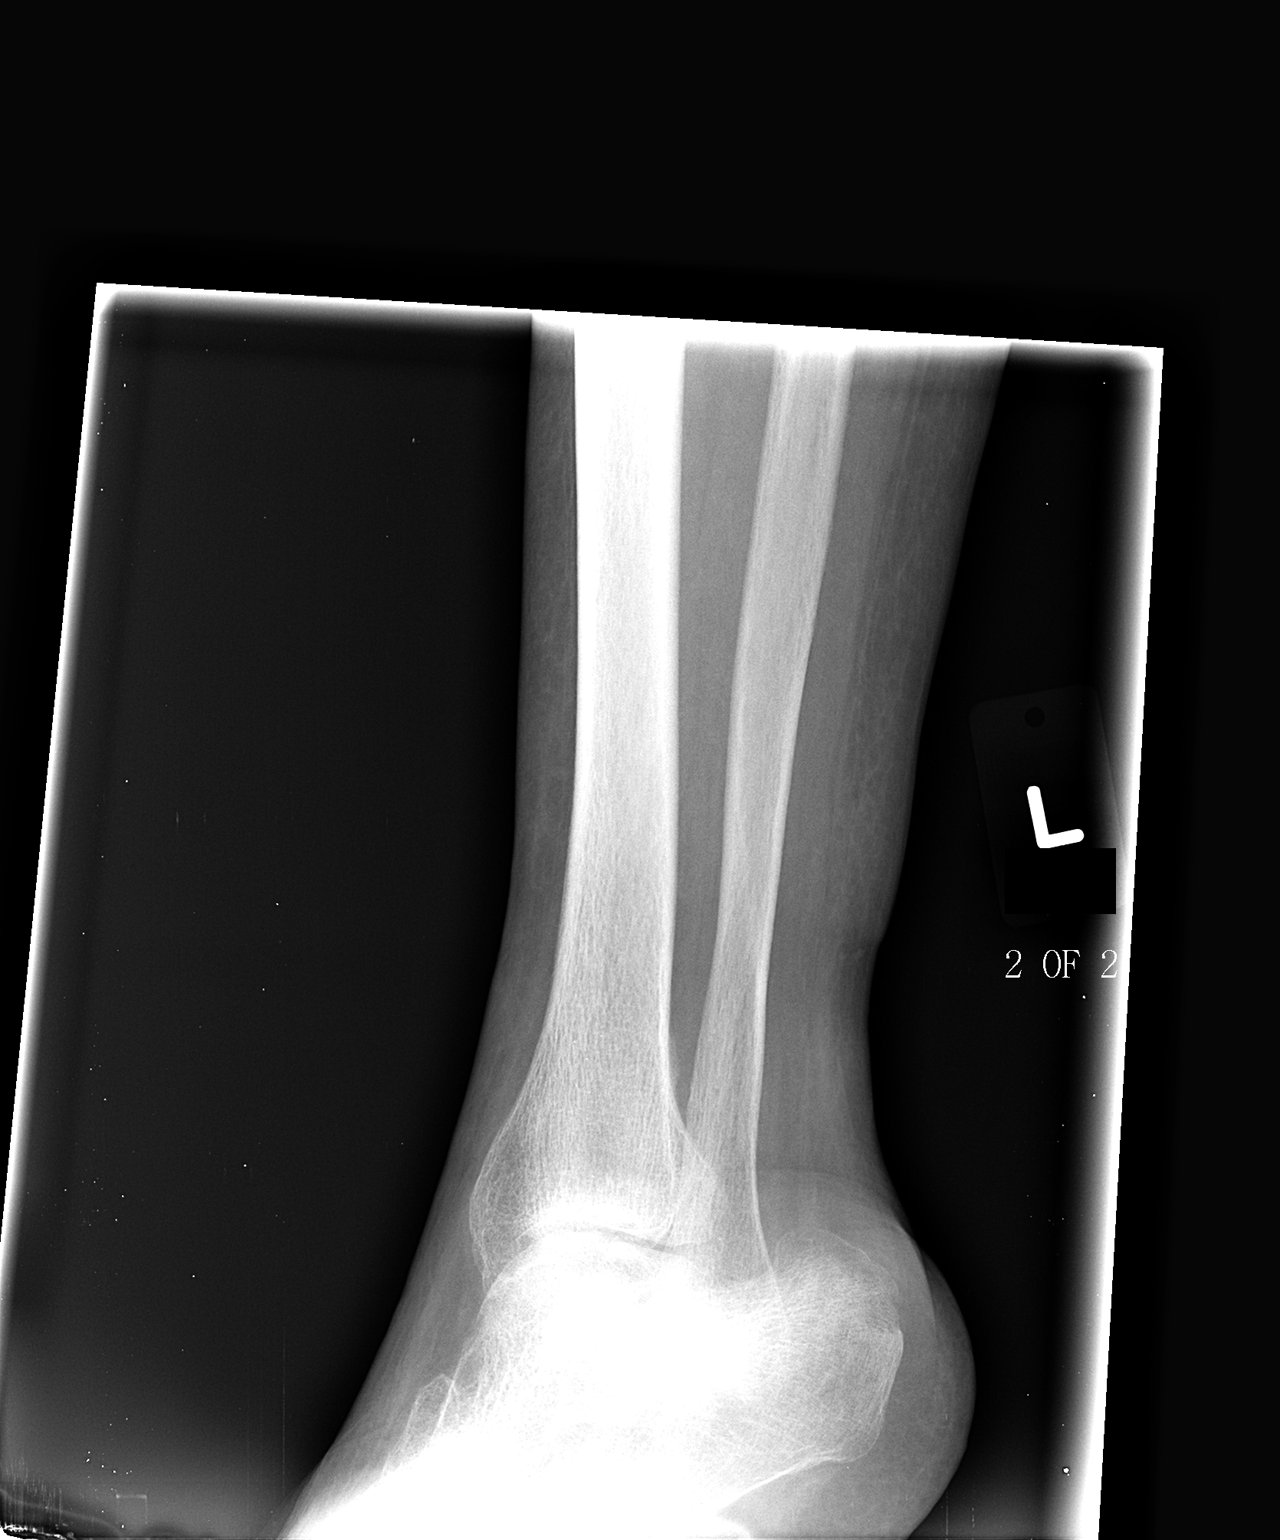

[3 of 3 positions shown; findings below may reference images not displayed]

FINDINGS: Three views of the left ankle submitted. No acute fracture or
subluxation. No radiopaque foreign body.
IMPRESSION: Negative.
